# Patient Record
Sex: Male | Born: 1950
Health system: Southern US, Community
[De-identification: ages and names within clinical notes are randomized; demographics above are authoritative.]

## PROBLEM LIST (undated history)

## (undated) DIAGNOSIS — E785 Hyperlipidemia, unspecified: Secondary | ICD-10-CM

## (undated) DIAGNOSIS — M109 Gout, unspecified: Secondary | ICD-10-CM

## (undated) DIAGNOSIS — R12 Heartburn: Secondary | ICD-10-CM

## (undated) DIAGNOSIS — I639 Cerebral infarction, unspecified: Secondary | ICD-10-CM

## (undated) DIAGNOSIS — E119 Type 2 diabetes mellitus without complications: Secondary | ICD-10-CM

## (undated) DIAGNOSIS — I10 Essential (primary) hypertension: Secondary | ICD-10-CM

## (undated) DIAGNOSIS — J45909 Unspecified asthma, uncomplicated: Secondary | ICD-10-CM

## (undated) DIAGNOSIS — E669 Obesity, unspecified: Secondary | ICD-10-CM

## (undated) HISTORY — DX: Obesity, unspecified: E66.9

## (undated) HISTORY — DX: Type 2 diabetes mellitus without complications: E11.9

## (undated) HISTORY — DX: Hyperlipidemia, unspecified: E78.5

---

## 1999-09-16 ENCOUNTER — Ambulatory Visit (HOSPITAL_COMMUNITY): Admission: RE | Admit: 1999-09-16 | Discharge: 1999-09-16 | Payer: Self-pay | Admitting: Family Medicine

## 1999-09-16 ENCOUNTER — Encounter: Payer: Self-pay | Admitting: Family Medicine

## 2003-02-14 HISTORY — PX: KNEE SURGERY: SHX244

## 2003-02-18 ENCOUNTER — Ambulatory Visit (HOSPITAL_BASED_OUTPATIENT_CLINIC_OR_DEPARTMENT_OTHER): Admission: RE | Admit: 2003-02-18 | Discharge: 2003-02-18 | Payer: Self-pay | Admitting: Orthopedic Surgery

## 2003-02-18 ENCOUNTER — Ambulatory Visit (HOSPITAL_COMMUNITY): Admission: RE | Admit: 2003-02-18 | Discharge: 2003-02-18 | Payer: Self-pay | Admitting: Orthopedic Surgery

## 2005-09-13 ENCOUNTER — Emergency Department (HOSPITAL_COMMUNITY): Admission: EM | Admit: 2005-09-13 | Discharge: 2005-09-13 | Payer: Self-pay | Admitting: Emergency Medicine

## 2005-09-18 ENCOUNTER — Inpatient Hospital Stay (HOSPITAL_COMMUNITY): Admission: EM | Admit: 2005-09-18 | Discharge: 2005-09-22 | Payer: Self-pay | Admitting: Emergency Medicine

## 2005-09-18 ENCOUNTER — Ambulatory Visit: Payer: Self-pay | Admitting: *Deleted

## 2005-09-18 DIAGNOSIS — H53461 Homonymous bilateral field defects, right side: Secondary | ICD-10-CM

## 2005-09-18 DIAGNOSIS — Z8673 Personal history of transient ischemic attack (TIA), and cerebral infarction without residual deficits: Secondary | ICD-10-CM

## 2005-09-18 HISTORY — DX: Homonymous bilateral field defects, right side: H53.461

## 2005-09-18 HISTORY — DX: Personal history of transient ischemic attack (TIA), and cerebral infarction without residual deficits: Z86.73

## 2005-09-19 ENCOUNTER — Encounter (INDEPENDENT_AMBULATORY_CARE_PROVIDER_SITE_OTHER): Payer: Self-pay | Admitting: Cardiology

## 2005-09-19 ENCOUNTER — Encounter (INDEPENDENT_AMBULATORY_CARE_PROVIDER_SITE_OTHER): Payer: Self-pay | Admitting: Neurology

## 2005-09-20 ENCOUNTER — Ambulatory Visit: Payer: Self-pay | Admitting: Physical Medicine & Rehabilitation

## 2005-09-26 ENCOUNTER — Encounter: Admission: RE | Admit: 2005-09-26 | Discharge: 2005-12-25 | Payer: Self-pay | Admitting: Nurse Practitioner

## 2005-10-15 ENCOUNTER — Inpatient Hospital Stay (HOSPITAL_COMMUNITY): Admission: EM | Admit: 2005-10-15 | Discharge: 2005-10-17 | Payer: Self-pay | Admitting: Emergency Medicine

## 2005-10-18 ENCOUNTER — Ambulatory Visit (HOSPITAL_COMMUNITY): Admission: RE | Admit: 2005-10-18 | Discharge: 2005-10-18 | Payer: Self-pay | Admitting: Family Medicine

## 2005-10-18 ENCOUNTER — Ambulatory Visit: Payer: Self-pay | Admitting: Family Medicine

## 2005-11-01 ENCOUNTER — Ambulatory Visit: Payer: Self-pay | Admitting: Family Medicine

## 2005-11-01 ENCOUNTER — Ambulatory Visit (HOSPITAL_COMMUNITY): Admission: RE | Admit: 2005-11-01 | Discharge: 2005-11-01 | Payer: Self-pay | Admitting: Sports Medicine

## 2005-12-04 ENCOUNTER — Ambulatory Visit (HOSPITAL_COMMUNITY): Admission: RE | Admit: 2005-12-04 | Discharge: 2005-12-04 | Payer: Self-pay | Admitting: Cardiovascular Disease

## 2005-12-05 ENCOUNTER — Ambulatory Visit: Payer: Self-pay | Admitting: Family Medicine

## 2005-12-11 ENCOUNTER — Encounter (HOSPITAL_COMMUNITY): Admission: RE | Admit: 2005-12-11 | Discharge: 2005-12-12 | Payer: Self-pay | Admitting: Family Medicine

## 2005-12-14 ENCOUNTER — Ambulatory Visit: Payer: Self-pay | Admitting: Sports Medicine

## 2005-12-26 ENCOUNTER — Encounter: Admission: RE | Admit: 2005-12-26 | Discharge: 2006-03-27 | Payer: Self-pay | Admitting: Family Medicine

## 2005-12-28 ENCOUNTER — Ambulatory Visit: Payer: Self-pay | Admitting: Family Medicine

## 2006-01-08 ENCOUNTER — Ambulatory Visit: Payer: Self-pay | Admitting: Gastroenterology

## 2006-03-30 ENCOUNTER — Ambulatory Visit: Payer: Self-pay | Admitting: Family Medicine

## 2006-04-12 DIAGNOSIS — E0500 Thyrotoxicosis with diffuse goiter without thyrotoxic crisis or storm: Secondary | ICD-10-CM | POA: Insufficient documentation

## 2006-04-12 DIAGNOSIS — I6789 Other cerebrovascular disease: Secondary | ICD-10-CM | POA: Insufficient documentation

## 2006-04-12 DIAGNOSIS — F528 Other sexual dysfunction not due to a substance or known physiological condition: Secondary | ICD-10-CM | POA: Insufficient documentation

## 2006-04-12 DIAGNOSIS — I219 Acute myocardial infarction, unspecified: Secondary | ICD-10-CM | POA: Insufficient documentation

## 2006-06-13 ENCOUNTER — Telehealth: Payer: Self-pay | Admitting: *Deleted

## 2006-08-07 ENCOUNTER — Telehealth: Payer: Self-pay | Admitting: Family Medicine

## 2006-10-11 ENCOUNTER — Telehealth (INDEPENDENT_AMBULATORY_CARE_PROVIDER_SITE_OTHER): Payer: Self-pay | Admitting: *Deleted

## 2006-10-12 ENCOUNTER — Ambulatory Visit: Payer: Self-pay | Admitting: Family Medicine

## 2006-10-12 ENCOUNTER — Encounter (INDEPENDENT_AMBULATORY_CARE_PROVIDER_SITE_OTHER): Payer: Self-pay | Admitting: *Deleted

## 2006-10-12 DIAGNOSIS — F17200 Nicotine dependence, unspecified, uncomplicated: Secondary | ICD-10-CM

## 2006-10-12 DIAGNOSIS — I10 Essential (primary) hypertension: Secondary | ICD-10-CM | POA: Insufficient documentation

## 2006-10-12 DIAGNOSIS — E1169 Type 2 diabetes mellitus with other specified complication: Secondary | ICD-10-CM | POA: Insufficient documentation

## 2006-10-12 DIAGNOSIS — E119 Type 2 diabetes mellitus without complications: Secondary | ICD-10-CM | POA: Insufficient documentation

## 2006-10-12 DIAGNOSIS — E785 Hyperlipidemia, unspecified: Secondary | ICD-10-CM

## 2006-10-12 DIAGNOSIS — R5383 Other fatigue: Secondary | ICD-10-CM

## 2006-10-12 DIAGNOSIS — F339 Major depressive disorder, recurrent, unspecified: Secondary | ICD-10-CM | POA: Insufficient documentation

## 2006-10-12 DIAGNOSIS — R5381 Other malaise: Secondary | ICD-10-CM | POA: Insufficient documentation

## 2006-10-16 LAB — CONVERTED CEMR LAB
Alkaline Phosphatase: 96 units/L (ref 39–117)
Creatinine, Ser: 1.5 mg/dL (ref 0.40–1.50)
Glucose, Bld: 94 mg/dL (ref 70–99)
HDL: 65 mg/dL (ref >39)
LDL Cholesterol: 50 mg/dL (ref 0–99)
MCHC: 33.1 g/dL (ref 30.0–36.0)
MCV: 91.6 fL (ref 78.0–100.0)
RBC: 4.65 M/uL (ref 4.22–5.81)
Sodium: 136 meq/L (ref 135–145)
T3, Free: 2.6 pg/mL (ref 2.3–4.2)
Total Bilirubin: 0.5 mg/dL (ref 0.3–1.2)
Total CHOL/HDL Ratio: 2
Total Protein: 7.5 g/dL (ref 6.0–8.3)
Triglycerides: 76 mg/dL (ref <150)
VLDL: 15 mg/dL (ref 0–40)

## 2006-10-30 ENCOUNTER — Telehealth: Payer: Self-pay | Admitting: *Deleted

## 2006-11-02 ENCOUNTER — Encounter: Payer: Self-pay | Admitting: Family Medicine

## 2006-11-14 ENCOUNTER — Encounter (INDEPENDENT_AMBULATORY_CARE_PROVIDER_SITE_OTHER): Payer: Self-pay | Admitting: *Deleted

## 2006-11-14 ENCOUNTER — Ambulatory Visit: Payer: Self-pay | Admitting: Family Medicine

## 2006-12-25 ENCOUNTER — Ambulatory Visit: Payer: Self-pay | Admitting: Family Medicine

## 2006-12-25 LAB — CONVERTED CEMR LAB
Cholesterol, target level: 200 mg/dL
HDL goal, serum: 40 mg/dL
LDL Goal: 70 mg/dL

## 2007-03-10 IMAGING — US US ABDOMEN COMPLETE
1 series · 13 of 25 positions shown · non-contrast
Comparison: None.

CLINICAL DATA: Chest pain.  Evaluate for gallstones.  
ABDOMEN ULTRASOUND:
TECHNIQUE: Complete abdominal ultrasound examination was performed including evaluation of the liver, gallbladder, bile ducts, pancreas, kidneys, spleen, IVC, and abdominal aorta.

[Series 1: unknown · 0.30mm/px · 13 of 61 slices shown]
[im 1/61]
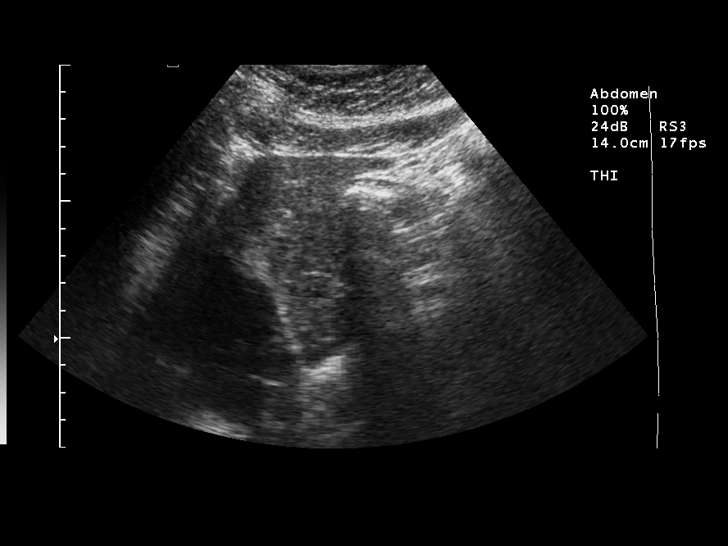
[im 6/61]
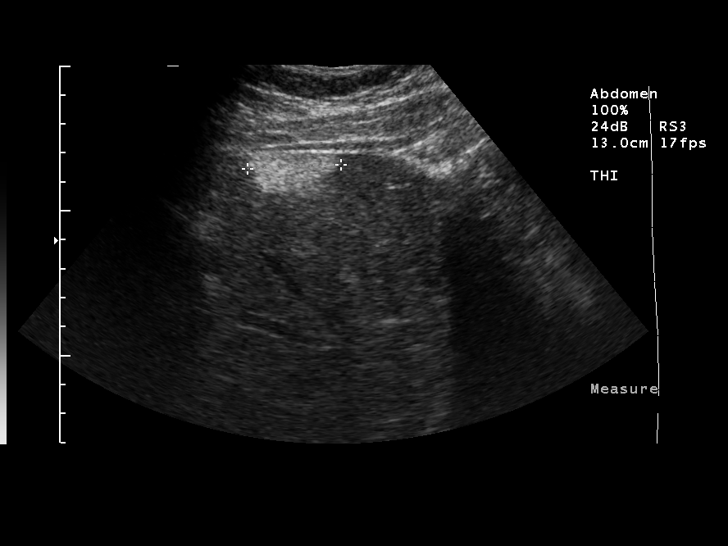
[im 11/61]
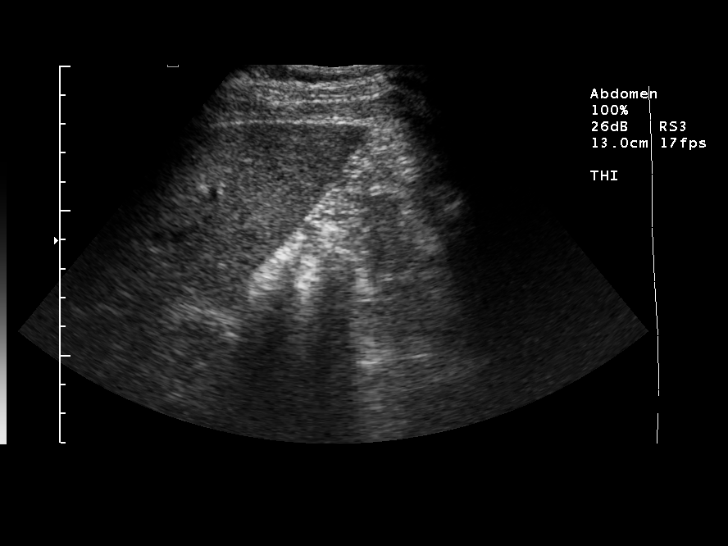
[im 16/61]
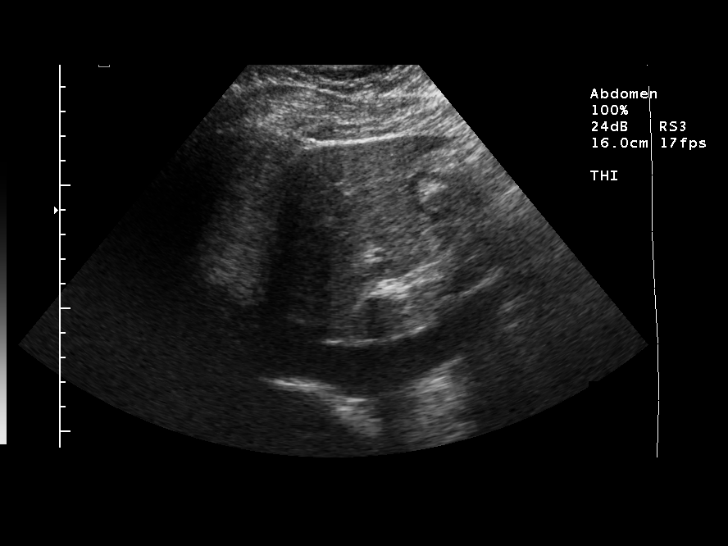
[im 21/61]
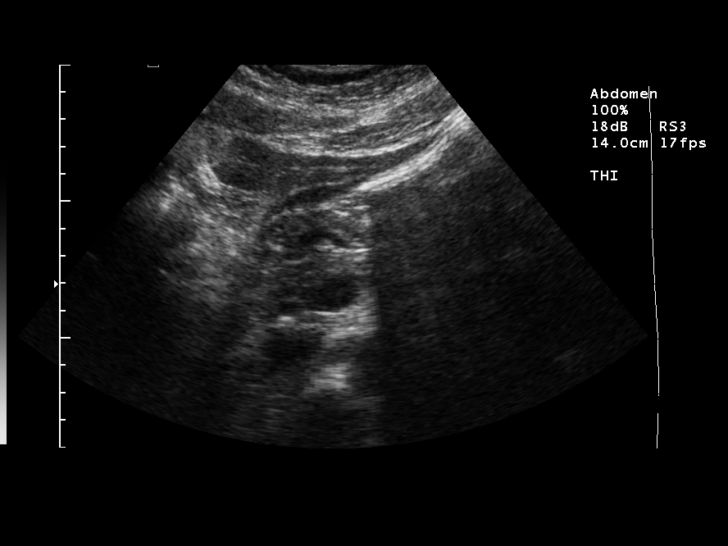
[im 26/61]
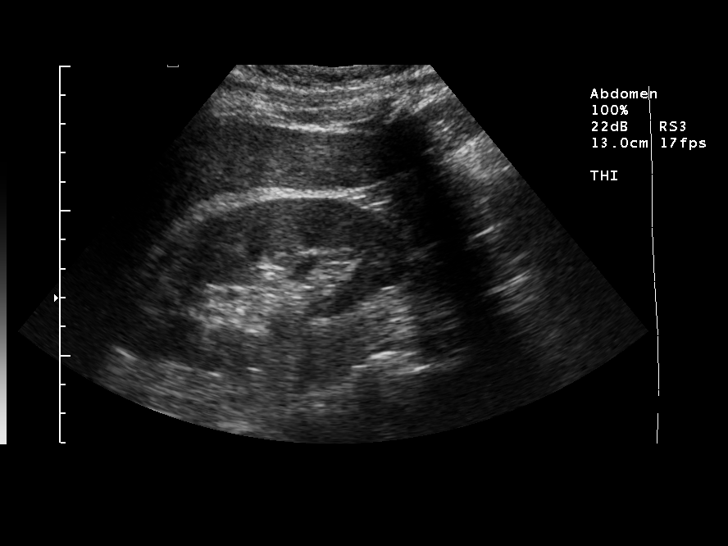
[im 31/61]
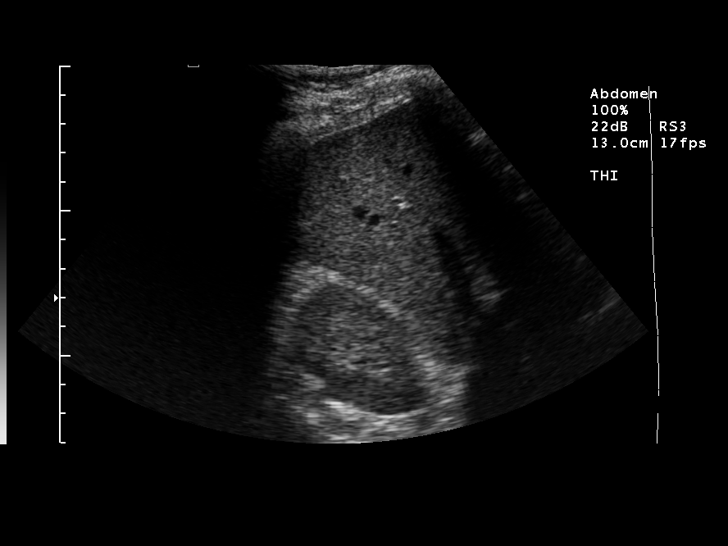
[im 36/61]
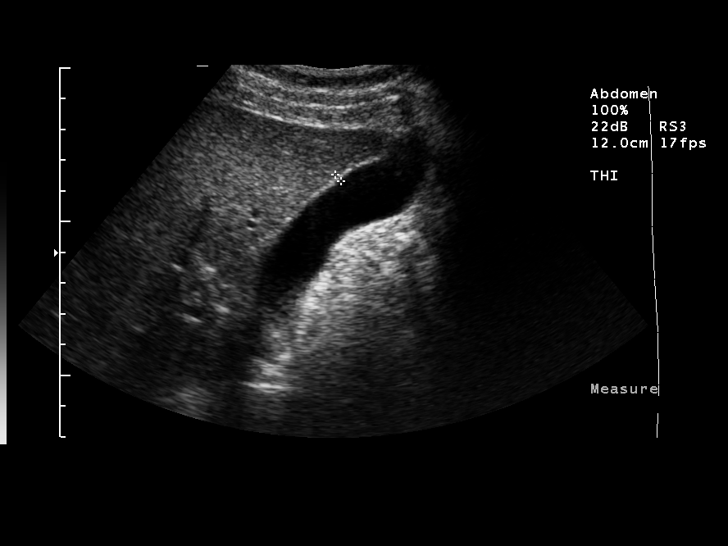
[im 41/61]
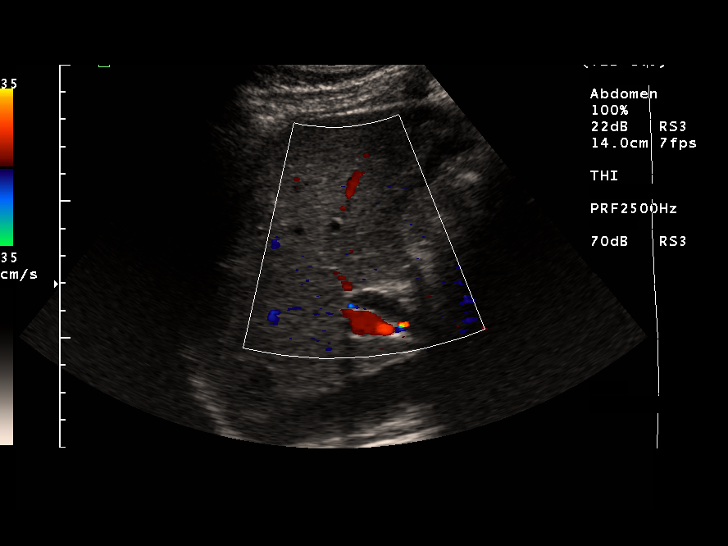
[im 46/61]
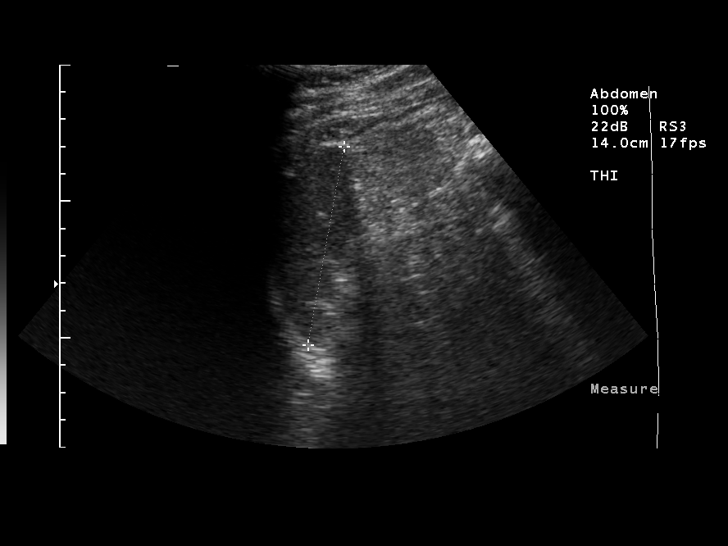
[im 51/61]
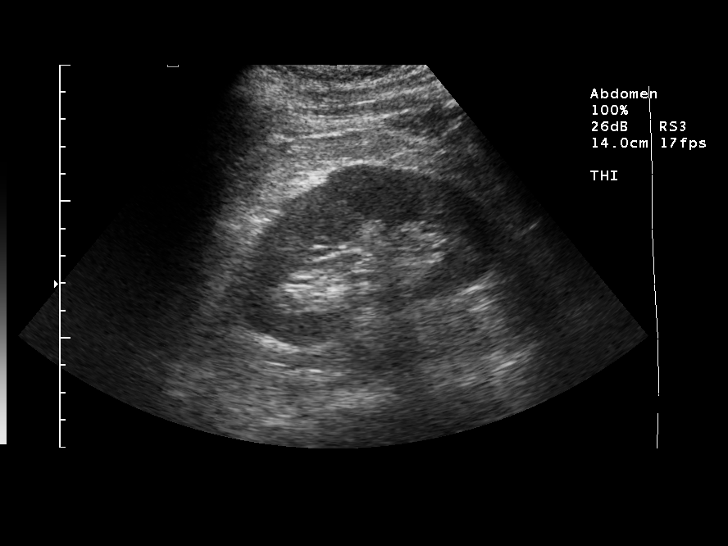
[im 56/61]
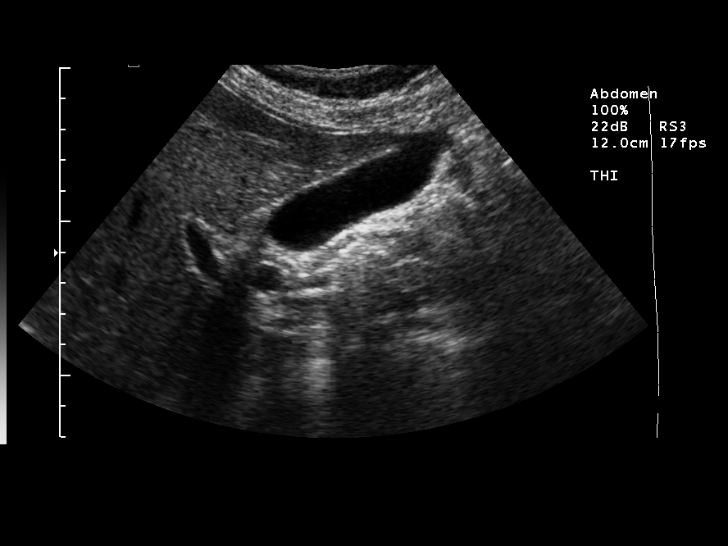
[im 61/61]
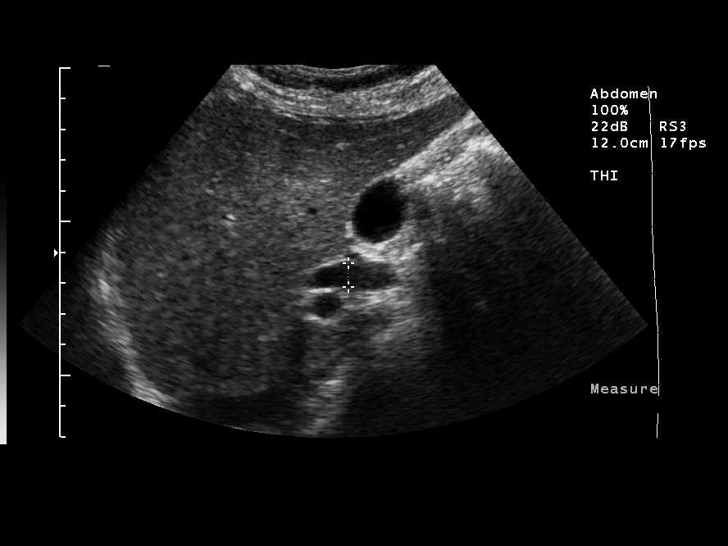

[13 of 25 positions shown; findings below may reference images not displayed]

FINDINGS: There is no gallstone, gallbladder wall thickening, or pericholecystic fluid.  Sonographic Murphy sign was no elicited.  The common duct is borderline/mildly dilated for age measuring up to 7 to 8 mm.  There is no intrahepatic biliary ductal dilatation.  No causative stone is identified.  
In the left lobe of the liver, there is a subcapsular 2.1 X 3.2 X 1.3 cm hyperechoic focus.  This does not deform the adjacent hepatic capsule. 
The IVC is normal in caliber.  The pancreas is poorly visualized. 
The spleen is normal in size and echotexture.  
The right kidney is 11.1 and the left kidney is 11.0 cm.  There is no hydronephrosis.  
The abdominal aorta is not aneurysmal.
IMPRESSION: 1.  No evidence of cholelithiasis or cholecystitis.  
2.  Borderline/mild common duct dilatation for age.  Consider correlation with bilirubin levels.  
3.  3.2 cm hyperechoic subcapsular left liver lobe focus.  Presumably no history of malignancy, this most likely represents a benign entity.  Favored differentiation diagnoses would include focal steatosis and a hemangioma.  Recommend nonemergent hepatic MR to confirm when patient is clinically stable and able to hold his breath.  This study could also more entirely evaluate the distal common duct.  
4.  Limited evaluation of the pancreas.

## 2007-04-10 ENCOUNTER — Ambulatory Visit: Payer: Self-pay | Admitting: Internal Medicine

## 2007-04-12 ENCOUNTER — Ambulatory Visit: Payer: Self-pay | Admitting: *Deleted

## 2007-06-14 ENCOUNTER — Ambulatory Visit: Payer: Self-pay | Admitting: Internal Medicine

## 2007-08-29 ENCOUNTER — Ambulatory Visit: Payer: Self-pay | Admitting: Internal Medicine

## 2007-08-29 ENCOUNTER — Encounter (INDEPENDENT_AMBULATORY_CARE_PROVIDER_SITE_OTHER): Payer: Self-pay | Admitting: Family Medicine

## 2007-08-29 LAB — CONVERTED CEMR LAB
Albumin: 4.5 g/dL (ref 3.5–5.2)
BUN: 26 mg/dL — ABNORMAL HIGH (ref 6–23)
Calcium: 9.2 mg/dL (ref 8.4–10.5)
Chloride: 110 meq/L (ref 96–112)
Free T4: 0.98 ng/dL (ref 0.89–1.80)
Glucose, Bld: 105 mg/dL — ABNORMAL HIGH (ref 70–99)
HDL: 60 mg/dL (ref >39)
LDL Cholesterol: 38 mg/dL (ref 0–99)
Lymphocytes Relative: 35 % (ref 12–46)
Lymphs Abs: 2.3 10*3/uL (ref 0.7–4.0)
Monocytes Relative: 9 % (ref 3–12)
Neutro Abs: 3.5 10*3/uL (ref 1.7–7.7)
Neutrophils Relative %: 54 % (ref 43–77)
Platelets: 299 10*3/uL (ref 150–400)
Potassium: 4.3 meq/L (ref 3.5–5.3)
RBC: 4.32 M/uL (ref 4.22–5.81)
Sodium: 141 meq/L (ref 135–145)
T3 Uptake Ratio: 33.3 % (ref 22.5–37.0)
Total Protein: 7.3 g/dL (ref 6.0–8.3)
Triglycerides: 54 mg/dL (ref <150)
WBC: 6.5 10*3/uL (ref 4.0–10.5)

## 2007-09-02 ENCOUNTER — Encounter: Payer: Self-pay | Admitting: Family Medicine

## 2007-09-02 LAB — CONVERTED CEMR LAB
Hgb A1c MFr Bld: 5.6 % (ref 4.6–6.1)
PSA: 1.33 ng/mL (ref 0.10–4.00)

## 2007-09-23 ENCOUNTER — Ambulatory Visit: Payer: Self-pay | Admitting: Internal Medicine

## 2008-04-13 ENCOUNTER — Encounter: Payer: Self-pay | Admitting: Family Medicine

## 2008-04-13 ENCOUNTER — Ambulatory Visit: Payer: Self-pay | Admitting: Internal Medicine

## 2008-04-13 LAB — CONVERTED CEMR LAB
ALT: 18 units/L (ref 0–53)
AST: 19 units/L (ref 0–37)
Alkaline Phosphatase: 50 units/L (ref 39–117)
Band Neutrophils: 0 % (ref 0–10)
Basophils Absolute: 0 10*3/uL (ref 0.0–0.1)
Basophils Relative: 0 % (ref 0–1)
CO2: 22 meq/L (ref 19–32)
Cholesterol: 140 mg/dL (ref 0–200)
Creatinine, Ser: 1.43 mg/dL (ref 0.40–1.50)
Eosinophils Absolute: 0.1 10*3/uL (ref 0.0–0.7)
Hemoglobin: 14.6 g/dL (ref 13.0–17.0)
LDL Cholesterol: 47 mg/dL (ref 0–99)
MCHC: 33.6 g/dL (ref 30.0–36.0)
Monocytes Relative: 9 % (ref 3–12)
Neutro Abs: 5.1 10*3/uL (ref 1.7–7.7)
Neutrophils Relative %: 63 % (ref 43–77)
RDW: 15.5 % (ref 11.5–15.5)
Sodium: 140 meq/L (ref 135–145)
TSH: 3.435 microintl units/mL (ref 0.350–4.50)
Total Bilirubin: 0.5 mg/dL (ref 0.3–1.2)
Total CHOL/HDL Ratio: 1.7
Total Protein: 7.8 g/dL (ref 6.0–8.3)
VLDL: 11 mg/dL (ref 0–40)

## 2008-05-27 ENCOUNTER — Encounter: Payer: Self-pay | Admitting: Family Medicine

## 2008-05-27 ENCOUNTER — Ambulatory Visit: Payer: Self-pay | Admitting: Internal Medicine

## 2008-05-27 LAB — CONVERTED CEMR LAB
Sed Rate: 4 mm/hr (ref 0–16)
Uric Acid, Serum: 9.2 mg/dL — ABNORMAL HIGH (ref 4.0–7.8)

## 2008-06-11 ENCOUNTER — Ambulatory Visit: Payer: Self-pay | Admitting: Internal Medicine

## 2008-09-11 ENCOUNTER — Ambulatory Visit: Payer: Self-pay | Admitting: Internal Medicine

## 2008-12-02 ENCOUNTER — Ambulatory Visit: Payer: Self-pay | Admitting: Internal Medicine

## 2008-12-22 ENCOUNTER — Encounter: Admission: RE | Admit: 2008-12-22 | Discharge: 2009-01-22 | Payer: Self-pay | Admitting: Family Medicine

## 2010-07-01 NOTE — Discharge Summary (Signed)
[name], [name]:  [id number]   MEDICAL RECORD NO.:  [id number]          PATIENT TYPE:  INP   LOCATION:  3032                         FACILITY:  [facility]   PHYSICIAN:  Genene Churn. Love, M.D.    DATE OF BIRTH:  [birth year]   DATE OF ADMISSION:  09/18/2005  DATE OF DISCHARGE:  09/22/2005                                 DISCHARGE SUMMARY   DISCHARGE DIAGNOSES:  1. Acute left posterior infarction secondary to left PCA cutoff, thought      to be embolic though no source found.  2. Right internal carotid artery stenosis 60-to-80%.  3. Hypertension.  4. Asthma.   DISCHARGE MEDICATIONS:  1. Aspirin 81 mg a day through October 03, 2005, then discontinue.  2. Aggrenox 1 a day at bedtime through October 03, 2005, then increase to 2      times a day.  3. Tylenol 2 tablets 30 minutes before Aggrenox dose x1 week then stop.  4. Pepcid 20 mg a day.  5. Diovan 80 mg a day.   STUDIES PERFORMED:  1. Carotid Doppler which shows left distal internal carotid artery plaque      with 60-to-79% stenosis, no right ICA stenosis, vertebral artery flow      antegrade bilaterally.  2. 2-D echocardiogram shows ejection fraction of 55-to-65% with no left      ventricular regional wall motion abnormalities, possibility of  a      foramen ovale could not be excluded, there was no embolic source.  3. EKG shows normal sinus rhythm with nonspecific ST and T wave      abnormalities.  4. CT of the head shows subtle hypoattenuation and loss of gray white      matter in left occipital lobe concerning for acute infarction, thrombus      or ischemic disease.  Chest x-rays show normal chest.  5. MRI of the brain is limited secondary to significant artifact arising      from metallic structure in tongue, moderate-to-large size acute non-      hemorrhagic infarct involving medial and posterior left temporal lobe,      left occipital lobe and left thalamus, moderate nonspecific white  matter-type changes.  6. MRA of the head shows blunt cutoff of the left posterior cerebral      artery at P2 origin, moderate branch vessel atherosclerotic type      changes, slightly limited exam due to artifact metallic structure in      patient's tongue.  7. Chest x-ray:  No active disease.   LABORATORY STUDIES:  CBC on admission:  Normal differential and normal sed  rate.  Five coagulation studies on admission normal, factor V Leiden  negative, lupus anticoagulant not detected.  Protein C 71, protein S 89,  protein S fractional 60, protein C fractional 115.  Chemistries with glucose  101 to 124, otherwise normal.  Liver function tests normal.  Hemoglobin A1c  5.4, homocystine 10.9.  Cardiac enzymes with troponin 0.10 and 0.04,  otherwise normal.  Cholesterol 125, triglycerides 66, HDL 38 and LDL 74.  Complement C3 is  117, complement C4 28, complement CH50 117.  Urine drug  screen was positive for cannabinoid, urinalysis normal and a cardiolipin  antibody at 7.  ANA negative, beta-2 glycoprotein negative, prothrombin gene  mutation negative.   HISTORY OF PRESENT ILLNESS:  [name] is a 60 year old African-  American male who has no significant past medical history.  He presents to  the ED after awakening the morning of admission with left-sided headache,  right-sided weakness and numbness in the upper and lower extremities,  slurred speech and right visual loss.  Patient states his symptoms started  when he woke up this morning between 8 and 9 a.m.  His symptoms have been  stable.  CT in the emergency room showed a left occipital infarct.  The  patient has had no prior symptoms that are similar.  He denies vertigo or  any problems with falls or loss of consciousness.  Of note, the patient was  in the emergency department last week for chest pain and left AMA.  He will  be admitted to the hospital for further evaluation.   HOSPITAL COURSE:  The patient was found to have an  acute left inferior  infarct which appeared embolic, though no etiology was found including a  transesophageal echocardiogram which was unremarkable.  He was placed on  Aggrenox for secondary stoke prevention.  Hypercoagulable panel was  negative, but patient was found to have vascular risk factors of  hypertension and right ICA stenosis.  He was evaluated by PT/OT and  initially felt to need inpatient rehab, but continued to progress throughout  hospitalization until the point he was safe for discharge home with  outpatient therapies.  His most significant deficit seems to be that of a  right homonymous hemianopsia, for which he was referred to a Dr. Aura Camps at time of discharge.   CONDITION ON DISCHARGE:  Patient alert and oriented x3, appeared to have  right homonymous hemianopsia.  He has no aphagia, no dysarthria.  His eye  movements are full.  He has some mild right-hand weakness with decreased  finger-to-nose asymmetry on the right, he has decreased sensation on the  right, strength in the left side is normal, sensation in the left side is  normal.  Chest is clear to auscultation.  His heart rate is regular.   DISCHARGE PLAN:  1. Discharge home with family.  2. Outpatient PT/OT and speech therapy.  3. Aggrenox secondary for stroke prevention.  4. Follow up with Dr. Aura Camps for evaluation of visual fields on      Friday, August 17th, at 11 a.m.  5. Follow up with Dr. Pearlean Brownie in two to three months.  6. Follow up with primary care physician, Dr. Warrick Parisian, within the      next one month.      Annie Main, N.P.    ______________________________  Genene Churn. Sandria Manly, M.D.    SB/MEDQ  D:  10/03/2005  T:  10/03/2005  Job:  [id number]   cc:   Pramod P. Pearlean Brownie, MD  [facility]  Casimiro Needle A. Karleen Hampshire, M.D.

## 2010-07-01 NOTE — Assessment & Plan Note (Signed)
[facility]                           GASTROENTEROLOGY OFFICE NOTE   NAME:[name], [name]                     MRN:          [id number]  DATE:01/08/2006                            DOB:          1950-03-13    REFERRED BY:  Ruthe Mannan, M.D. from [facility].   NEUROLOGIST:  His neurologist is Dr. Pearlean Brownie from [facility].   REASON FOR REFERRAL:  Dr. Dayton Martes asked me to evaluate [name] regarding  screening colonoscopy.   HISTORY OF PRESENT ILLNESS:  [name] is a very pleasant 60 year old man  who takes Plavix for recent stroke in August of 2007. He has had no GI  bleeding; no trouble with his bowels including constipation nor diarrhea.   REVIEW OF SYSTEMS:  Is essentially normal and is available on his nursing  intake sheet.   PAST MEDICAL HISTORY:  1. Stroke August 2007.  2. Acute myocardial infarction in or around September 2007, medically      managed.  3. Hypertension.  4. Cardiac arrhythmias.  5. Asthma.  6. Thyroid trouble.  7. Right knee surgery.   CURRENT MEDICATIONS:  Protonix, Plavix, Caduet, Wellbutrin, Diovan,  propranolol, methimazole.   SOCIAL HISTORY:  Divorced, 3 children. Smokes 2-3 cigarettes a day. Drinks  alcohol minimally. Uses marijuana.   FAMILY HISTORY:  No colon cancer or colon polyps in family.   PHYSICAL EXAMINATION:  Is 6 feet, 2 inches, 194 pounds, blood pressure is  118/72, pulse is 88.  CONSTITUTIONAL: Generally well-appearing.  NEUROLOGIC: Alert and oriented x3.  EYES: Extraocular movements intact.  MOUTH: Oropharynx moist. No lesions.  NECK: Supple. No lymphadenopathy.  CARDIOVASCULAR: HEART: Regular rate and rhythm.  LUNGS:  Clear to auscultation bilaterally.  ABDOMEN: Soft and nontender, nondistended. Normal bowel sounds.  EXTREMITIES: No lower extremity edema.  SKIN: No rashes or lesions on visible extremities.   ASSESSMENT/PLAN:  A 60 year old man at  routine risk for colorectal cancer,  at increased risk for colonoscopy complications given his current Plavix  use.   [name] had a stroke in August and has been on Plavix since then. Their  understanding was that he is at greater risk for repeat stroke in about a 6  month window time. It is not clear to them whether he will be able to come  off of the Plavix after 6 months or not. I discussed proceeding with the  colonoscopy now, while he is on Plavix, but there would be an increased risk  of bleeding and so I prefer to do this since this is for routine screening  purposes when he is off of Plavix. I will forward this note to Dr. Pearlean Brownie,  his neurologist, with the specific question of when he thinks it is  relatively more safe to hold his Plavix. If this 6 month window is his  general recommendation, I think it is completely safe to wait to do his  colonoscopy after that 6 month window is up. I would have him hold the  Plavix for 5-7 days prior to it. I see no reason for any further blood  tests  or imaging studies prior to then.     Rachael Fee, MD  Electronically Signed    DPJ/MedQ  DD: 01/08/2006  DT: 01/08/2006  Job #: [id number]   cc:   Ruthe Mannan, M.D.  Pramod P. Pearlean Brownie, MD

## 2010-07-01 NOTE — H&P (Signed)
NAMETAMMY, [name]   MEDICAL RECORD NO.:  [id number]           PATIENT TYPE:   LOCATION:                               FACILITY:  [facility]   PHYSICIAN:  Ruthe Mannan, M.D.            DATE OF BIRTH:   DATE OF ADMISSION:  10/15/2005  DATE OF DISCHARGE:                                HISTORY & PHYSICAL   CHIEF COMPLAINT:  Chest and abdominal pain.   HISTORY OF PRESENT ILLNESS:  [name] is a 60 year old male with a  history of a left occipital stroke in August of 2007.  He presents to the ED  today with chest pain x2-3 weeks, which has been worsening to 10/10 over the  last few days.  Pain typically occurs after eating and can last from 10-15  minutes up to one hour and does not radiate to arms or jaw.  It does radiate  to right lower back and occasionally with the pain he also complains of  right upper abdominal pain.  He denies any diaphoresis with pain.  He does  admit to shortness of breath x2-3 weeks which has worsened __________ .  He  denies wheezing, cough, or any sputum production.  He is also complaining of  increased urinary urgency which has been occurring on and off for a few  months.  He denies dysuria, hematuria.  He does think he has had fevers over  the last couple of days.  He did not measure any temperature at home.   PAST MEDICAL HISTORY:  Stroke in August 2007.   PAST SURGICAL HISTORY:  Knee surgery.   FAMILY HISTORY:  Father had a heart attack at age 55.  Mother had a stroke.   SOCIAL HISTORY:  He smokes 3-4 cigarettes per day, occasional ETOH and  marijuana use.  The patient lives alone, has a girlfriend.   MEDICATIONS:  1. Aggrenox b.i.d.  2. Zantac.  3. Diovan 80 mg daily.   REVIEW OF SYSTEMS:  GENERAL:  Positive fevers, night sweats.  RESPIRATORY:  Positive chest pain, positive shortness of breath.  No cough.  ABDOMINAL:  Positive abdominal pain, positive vomiting.  No diarrhea.  GENITOURINARY:  Urinary  frequency, positive urinary urgency.  No decreased stream.  NEUROLOGIC:  Positive right-sided weakness and blind spots on right side  from stroke.  PSYCHIATRIC:  Positive depression, positive trouble sleeping.   PHYSICAL EXAMINATION:  VITAL SIGNS:  Temperature 97.1, blood pressure  139/79, pulse 107, respirations 20, O2 saturation 99% on room air.  GENERAL:  He is alert in no acute distress.  HEENT:  Pupils equal and reactive to light.  RESPIRATORY:  Clear to auscultation bilaterally.  Pain reproducible with  inspiration or on palpation.  CARDIOVASCULAR:  Target cardiac regular rhythm.  Pain not producible with  palpation.  ABDOMEN:  Soft.  Mildly tender in right upper quadrant.  Positive bowel  sounds.  Some voluntary guarding.  EXTREMITIES:  No edema.  Good pulses.  RECTAL:  Prostate mildly enlarged.  Hemoccult negative.  NEUROLOGIC:  5/5 strength, right slightly  greater than left.   LABORATORY DATA:  Chest x-ray and KUB were negative.  Urinalysis negative.  Cardiac enzymes negative x2.   White count 5.9, H&H 13.4 and 34.4, platelets 282.  Chem-7:  Sodium 136,  potassium 4.1, chloride 105, CO2 23, BUN and creatinine are 21/1.4., glucose  123.   ASSESSMENT/PLAN:  This is a 60 year old male with past medical history of  stroke, __________  chest pain, and abdominal pain.  1. Chest pain.  Cardiac enzymes taken to rule out MI.  EKG showed      nonspecific ST changes in V2, V3, and V4.  The patient states the pain      is currently resolved.  He was not given anything in the ED other than      aspirin.  Likely GI.  Will start Protonix.  Also check drug screen and      may need to check whether profile was checked on last admission.  Also      get an EKG in the a.m.  2. Abdominal pain.  KUB is negative.  Lipase negative.  Hemoccult      negative.  Nausea currently resolved.  Check CMET and CBC in the      morning.  Consider abdominal ultrasound if spike fever overnight to      rule  out gallstones.  3. Depression.  Zoloft given daily, more for DVT prophylaxis.  The patient      is ambulatory and not __________ .           ______________________________  Ruthe Mannan, M.D.     TA/MEDQ  D:  10/15/2005  T:  10/15/2005  Job:  [id number]

## 2010-07-01 NOTE — Discharge Summary (Signed)
[name], [name]              ACCOUNT NO.:  [id number]   MEDICAL RECORD NO.:  [id number]          PATIENT TYPE:  INP   LOCATION:  3703                         FACILITY:  [facility]   PHYSICIAN:  Ruthe Mannan, M.D.       DATE OF BIRTH:  March 20, 1950   DATE OF ADMISSION:  10/15/2005  DATE OF DISCHARGE:  10/17/2005                                 DISCHARGE SUMMARY   PRIMARY CARE PHYSICIAN:  Ruthe Mannan, M.D.   This is a pleasant 60 year old African-American male who had a recent  history of a left-sided CVA in August of 2007, who presented to the ED on  October 15, 2005, with complaints of chest and abdominal pain for 2-3  weeks, which had been worsening over the past 2 days.  The patient notes  that the chest pain is mid epigastric and also goes into the right upper  quadrant and a burning that is worse after meals.  It usually lasts anywhere  from 15 minutes to 1 hour.  He had noticed occasional nausea and had vomited  twice prior to showing up to the emergency department.  The vomit was  nonbilious and nonbloody.  The bowel movements have been regular.  The  patient had been taking Pepcid for GERD.  He denied any shortness of breath  or diaphoresis and he noticed that the pain had been slightly worse with  exertion.  The patient had been hypercoagulative in the past, but a workup  was negative.  At the time of his stroke, his LDL was 74.  The patient is a  smoker and his dad had an MI at the age of 78.  He had some residual right-  sided weakness from his CVA and continues to work with PT as an outpatient.  The patient was then admitted to the ED.  Cardiac enzymes were taken.  They  were negative x2.  His CBC and BMET were within normal limits.  We also did  a chest x-ray and a KUB, which were also within normal limits.   Dictation ended at this point.           ______________________________  Ruthe Mannan, M.D.     TA/MEDQ  D:  10/17/2005  T:  10/17/2005  Job:  [id number]

## 2010-07-01 NOTE — H&P (Signed)
NAMEJIMY, GATES NO.:  [id number]   MEDICAL RECORD NO.:  [id number]          PATIENT TYPE:  INP   LOCATION:  1829                         FACILITY:  [facility]   PHYSICIAN:  Bevelyn Buckles. Champey, M.D.DATE OF BIRTH:  1950/12/27   DATE OF ADMISSION:  09/18/2005  DATE OF DISCHARGE:                                HISTORY & PHYSICAL   REQUESTING PHYSICIAN:  Paula Libra, MD   REASON FOR ADMISSION:  Stroke.   HISTORY OF PRESENT ILLNESS:  Mr. Blissett is a 60 year old African-American  male with no significant past medical history.  He presented to the ED after  awakening this morning with left-sided headache, right-sided  weakness/numbness, greater in the upper and lower extremities, slurred  speech, which has improved, and right visual loss.  Patient states that his  symptoms started when he woke up this morning at around 8-9 a.m.  His  symptoms have been stable.  CT in the emergency room showed a left occipital  infarct.  The patient has had no prior episodes.  He denies any vertigo,  swallowing problems, falls, or loss of consciousness.  The patient was in  the ED last week for chest pain and left AMA.   PAST MEDICAL HISTORY:  Positive for asthma.   CURRENT MEDICATIONS:  None.   ALLERGIES:  No known drug allergies.   FAMILY HISTORY:  Positive for stroke in his mother.   SOCIAL HISTORY:  Patient lives alone.  Smokes two cigars per day.  Drinks  one beer every two days.  Patient occasionally will smoke marijuana.   REVIEW OF SYSTEMS:  Positive, as per HPI, and also for depression.  Review  of systems, negative as per HPI, in greater than eight other organ systems.   PHYSICAL EXAMINATION:  VITAL SIGNS:  Temperature 97.8, blood pressure  184/99, pulse 102, respirations 18, O2 sat is 98%.  HEENT:  Normocephalic and atraumatic.  Extraocular muscles are intact.  Patient has a right visual field cut.  Pupils are equal, round and reactive  to light.  NECK:  Supple  with no carotid bruits.  LUNGS:  Clear.  HEART:  Regular.  ABDOMEN:  Soft and nontender.  EXTREMITIES:  Good pulses.  NEUROLOGIC:  Patient is awake, alert and oriented x3.  Language is fluent.  Patient responding to meds appropriately.  Cranial nerves:  Patient has a  left visual field cut, decreased sensation in the right face, otherwise  intact.  Motor examination:  Patient has +4/5 strength on the right and 5/5  strength on the left.  Patient has normal tone in all four extremities and  has a right parietal drift.  Sensory examination:  The patient has decreased  sensation on the right when compared to the left to light touch.  Reflexes  are 1-2+ throughout.  Toes are neutral bilaterally.  Cerebellar function is  within normal limits.  Finger to nose slightly slower on the right.  Gait is  not assessed secondary to safety.   LABS:  Sodium is 141, potassium 4.3, chloride 110, CO2 25, BUN 14,  creatinine 1.2, glucose 124.  WBC  6.7, hemoglobin 14.5, hematocrit 43,  platelets 266.  PT is 13.4, INR 1, PTT 26.   CT of the head showed hypoattenuation in the left occipital area, consistent  with an acute infarct.   IMPRESSION:  Mr. Macken is a 60 year old African-American male with new  left occipital stroke.  The patient is not a candidate for IV t-PA, as he is  greater than three hours out from onset of symptoms.  This patient woke up  this morning with the symptoms.  Admit to the stroke MD service.  Start an  aspirin per day.  Check MRI/MRA of the brain, carotid Dopplers, and  echocardiogram.  We will check his lipids and homocysteine level.  We will  get PT/OT and speech consults.  We will keep the patient n.p.o. and place  him on IV fluids, will test a swallowing evaluation.  We will also place the  patient on DVT prophylaxis.      Bevelyn Buckles. Nash Shearer, M.D.  Electronically Signed     DRC/MEDQ  D:  09/18/2005  T:  09/18/2005  Job:  [id number]

## 2010-07-01 NOTE — Op Note (Signed)
NAMETILMAN, MCCLAREN                        ACCOUNT NO.:  [id number]   MEDICAL RECORD NO.:  [id number]                   PATIENT TYPE:  AMB   LOCATION:  DSC                                  FACILITY:  [facility]   PHYSICIAN:  Feliberto Gottron. Turner Daniels, M.D.                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   PROCEDURE:  Right knee arthroscopic debridement of chondromalacia of the  patella focal grade 3 medial facet and posterior horn medial meniscus tear  focal debridement.   SURGEON:  Feliberto Gottron. Turner Daniels, M.D.   FIRST ASSISTANT:  Skip Mayer, P.A.-C.   ANESTHESIA:  Local with IV sedation.   ESTIMATED BLOOD LOSS:  Minimal.   FLUIDS REPLACED:  500 mL crystalloid.   DRAINS:  None.   TOURNIQUET TIME:  None.   INDICATIONS FOR PROCEDURE:  60 year old man with intermittent locking and  catching of his right knee.  MRI proven medial meniscal tear.  He desires  elective arthroscopic evaluation and treatment of his right knee having  failed conservative treatment with anti-inflammatory medicines and therapy.   DESCRIPTION OF PROCEDURE:  The patient was identified by arm band and taken  to the operating room at [facility].  Appropriate anesthetic  monitors were attached and local anesthesia with IV sedation induced into  the right knee.  Lateral post was applied to the table.  The right lower  extremity was prepped and draped in the usual sterile fashion from the ankle  to the mid thigh.  Using a #11 blade, standard inferomedial and  inferolateral peripatellar portals were then made allowing introduction of  the arthroscope through the inferolateral portal and the outflow through the  inferomedial  portal.  Diagnostic arthroscopy revealed focal grade 3  chondromalacia of the medial facet of the pillar which was debrided back to  a stable margin with a 3.5 Gator sucker shaver.  Moving into the medial  compartment, the medial meniscus had a posterior horn tear which was trimmed  with a 3.5 Gator sucker shaver as well as the straight small biters.  The  ACL and the PCL were intact.  The lateral compartment was pristine.  The  gutters were clear.  The scope was taken to the lateral side of the PCL  clearing the  posterior compartment.  The knee was irrigated with normal saline solution.  Arthroscopic instruments were removed.  A dressing of Xeroform, 4 by 4  dressing sponges, Webril, and an Ace wrap were applied.  The patient was  then awakened and taken to the recovery room without difficulty.  Feliberto Gottron. Turner Daniels, M.D.    Ovid Curd  D:  02/18/2003  T:  02/18/2003  Job:  [id number]

## 2010-07-01 NOTE — Discharge Summary (Signed)
[name], [name]              ACCOUNT NO.:  [id number]   MEDICAL RECORD NO.:  [id number]          PATIENT TYPE:  INP   LOCATION:  3703                         FACILITY:  [facility]   PHYSICIAN:  Ruthe Mannan, M.D.       DATE OF BIRTH:  [birth year]   DATE OF ADMISSION:  10/15/2005  DATE OF DISCHARGE:  10/17/2005                                 DISCHARGE SUMMARY   ADMISSION DIAGNOSES:  1. Chest pain.  2. Right upper quadrant abdominal pain.  3. History of cerebrovascular accident.  4. Gastroesophageal reflux disease.  5. Depression.  6. Hypertension.   DISCHARGE MEDICATIONS:  1. Plavix 75 mg q.day.  2. Protonix 40 mg daily.  3. Zoloft 50 mg q.day.   DISPOSITION:  This patient is being discharged.  He is stable.  He will  follow up with his primary care physician in the outpatient setting to  further workup his abdominal pain symptoms, perhaps consider an endoscopy in  the outpatient setting.   HOSPITAL COURSE:  1. This is a pleasant 60 year old African American male with a recent      history of a left-sided occipital CVA in August of 2007, who came to      the emergency room on September 2, complaining of substernal chest pain      and abdominal pain for 2 to 3 weeks, which was worsening over the last      couple of days.  He noticed some mid epigastric and right upper      quadrant pain and burning that was worsening postprandially and      occasional vomiting.  He had vomited twice.  He denied any diaphoresis      or shortness of breath.  He says, it is slightly worse with exertion.      He had a hypercoagulable workup during the time of his stroke which was      negative, which was stable.  His LDL was 74.  We admitted him to the      [facility] to a telemetry bed.  Because of chest      pain, we wanted to rule out an MI.  Patient had cardiac enzymes x3 that      were all negative, 8 hours apart.  His EKG was also normal.  We also      got a chest x-ray  and a KUB, which were also negative.  Chest pain had      essentially resolved by day 2 of admission.  On the night of September      3, he had called the nurse once for some chest pain, which was resolved      with Tylenol and some Maalox as well.  He has been started on Protonix,      which also seemed to help a little bit for his chest pain.  So, we are      thinking it was likely gastritis caused by his use of Aggrenox.  2. For his right upper quadrant abdominal pain, we ordered an abdominal  ultrasound, which was normal just to rule out gallstones.  Likely      thinking it is also gastritis related to the Aggrenox.  Will followup      in the outpatient setting as well.  He had vomited once during his      hospital course postprandially.  It was nonbilious, nonbloody.  That      was the only time that he vomited during his hospital course.  3. He has a history of CVA.  We kept him on Aggrenox b.i.d. while he was      here.  We will send him home on Plavix 75 mg daily, instead thinking      that the Aggrenox led to his gastritis.  His blood pressure and      cholesterol has been stabbing.  We will discuss smoking cessation in      the outpatient setting.  4. His anxiety and depression.  Patient expressed some depression and      difficulty sleeping since his stroke.  We started him on Zoloft 50 mg      daily.  The patient has agreed to try this out.  Discussed that it may      take a few weeks for him to see any results and will follow up with him      on an outpatient setting.  The patient denies any suicidal ideation or      homicidal ideation at this time.  5. Dispo.  This patient is stable enough to go home.  We will followup      gastritis in the outpatient setting.  Patient is instructed to continue      taking Protonix and Plavix at home and Zoloft and will followup.  He      has a followup appointment scheduled with me, Ruthe Mannan, on September      19 at 3:30 p.m.            ______________________________  Ruthe Mannan, M.D.     TA/MEDQ  D:  10/17/2005  T:  10/17/2005  Job:  [id number]

## 2013-02-10 ENCOUNTER — Encounter (HOSPITAL_COMMUNITY): Payer: Self-pay | Admitting: Emergency Medicine

## 2013-02-10 ENCOUNTER — Inpatient Hospital Stay (HOSPITAL_COMMUNITY)
Admission: EM | Admit: 2013-02-10 | Discharge: 2013-02-14 | DRG: 638 | Disposition: A | Discharge status: Home Health | Payer: Medicare HMO | Attending: Cardiovascular Disease | Admitting: Cardiovascular Disease

## 2013-02-10 ENCOUNTER — Emergency Department (HOSPITAL_COMMUNITY): Payer: Medicare HMO

## 2013-02-10 DIAGNOSIS — E871 Hypo-osmolality and hyponatremia: Secondary | ICD-10-CM | POA: Diagnosis present

## 2013-02-10 DIAGNOSIS — Z8673 Personal history of transient ischemic attack (TIA), and cerebral infarction without residual deficits: Secondary | ICD-10-CM

## 2013-02-10 DIAGNOSIS — N182 Chronic kidney disease, stage 2 (mild): Secondary | ICD-10-CM | POA: Diagnosis present

## 2013-02-10 DIAGNOSIS — R269 Unspecified abnormalities of gait and mobility: Secondary | ICD-10-CM | POA: Diagnosis present

## 2013-02-10 DIAGNOSIS — I10 Essential (primary) hypertension: Secondary | ICD-10-CM

## 2013-02-10 DIAGNOSIS — I69998 Other sequelae following unspecified cerebrovascular disease: Secondary | ICD-10-CM

## 2013-02-10 DIAGNOSIS — E785 Hyperlipidemia, unspecified: Secondary | ICD-10-CM

## 2013-02-10 DIAGNOSIS — N179 Acute kidney failure, unspecified: Secondary | ICD-10-CM | POA: Diagnosis present

## 2013-02-10 DIAGNOSIS — K7689 Other specified diseases of liver: Secondary | ICD-10-CM | POA: Diagnosis present

## 2013-02-10 DIAGNOSIS — R5381 Other malaise: Secondary | ICD-10-CM

## 2013-02-10 DIAGNOSIS — E1110 Type 2 diabetes mellitus with ketoacidosis without coma: Secondary | ICD-10-CM

## 2013-02-10 DIAGNOSIS — Z79899 Other long term (current) drug therapy: Secondary | ICD-10-CM

## 2013-02-10 DIAGNOSIS — E1310 Other specified diabetes mellitus with ketoacidosis without coma: Principal | ICD-10-CM | POA: Diagnosis present

## 2013-02-10 DIAGNOSIS — Z6832 Body mass index (BMI) 32.0-32.9, adult: Secondary | ICD-10-CM

## 2013-02-10 DIAGNOSIS — I129 Hypertensive chronic kidney disease with stage 1 through stage 4 chronic kidney disease, or unspecified chronic kidney disease: Secondary | ICD-10-CM | POA: Diagnosis present

## 2013-02-10 DIAGNOSIS — E669 Obesity, unspecified: Secondary | ICD-10-CM | POA: Diagnosis present

## 2013-02-10 DIAGNOSIS — F17200 Nicotine dependence, unspecified, uncomplicated: Secondary | ICD-10-CM | POA: Diagnosis present

## 2013-02-10 DIAGNOSIS — R209 Unspecified disturbances of skin sensation: Secondary | ICD-10-CM

## 2013-02-10 DIAGNOSIS — I6789 Other cerebrovascular disease: Secondary | ICD-10-CM

## 2013-02-10 HISTORY — DX: Cerebral infarction, unspecified: I63.9

## 2013-02-10 HISTORY — DX: Essential (primary) hypertension: I10

## 2013-02-10 HISTORY — DX: Heartburn: R12

## 2013-02-10 HISTORY — DX: Unspecified asthma, uncomplicated: J45.909

## 2013-02-10 HISTORY — DX: Type 2 diabetes mellitus with ketoacidosis without coma: E11.10

## 2013-02-10 LAB — POCT I-STAT TROPONIN I: Troponin i, poc: 0.06 ng/mL (ref 0.00–0.08)

## 2013-02-10 LAB — BASIC METABOLIC PANEL
BUN: 39 mg/dL — ABNORMAL HIGH (ref 6–23)
BUN: 37 mg/dL — ABNORMAL HIGH (ref 6–23)
CO2: 14 mEq/L — ABNORMAL LOW (ref 19–32)
CO2: 12 mEq/L — ABNORMAL LOW (ref 19–32)
Calcium: 9.4 mg/dL (ref 8.4–10.5)
Chloride: 98 mEq/L (ref 96–112)
Chloride: 100 mEq/L (ref 96–112)
Creatinine, Ser: 2.23 mg/dL — ABNORMAL HIGH (ref 0.50–1.35)
GFR calc Af Amer: 27 mL/min — ABNORMAL LOW (ref >90)
GFR calc Af Amer: 34 mL/min — ABNORMAL LOW (ref >90)
GFR calc non Af Amer: 24 mL/min — ABNORMAL LOW (ref >90)
GFR calc non Af Amer: 26 mL/min — ABNORMAL LOW (ref >90)
Glucose, Bld: 558 mg/dL (ref 70–99)
Glucose, Bld: 226 mg/dL — ABNORMAL HIGH (ref 70–99)
Potassium: 4.5 mEq/L (ref 3.5–5.1)
Potassium: 4.7 mEq/L (ref 3.5–5.1)
Potassium: 3.7 mEq/L (ref 3.5–5.1)
Sodium: 122 mEq/L — ABNORMAL LOW (ref 135–145)
Sodium: 131 mEq/L — ABNORMAL LOW (ref 135–145)
Sodium: 132 mEq/L — ABNORMAL LOW (ref 135–145)

## 2013-02-10 LAB — URINALYSIS, ROUTINE W REFLEX MICROSCOPIC
Leukocytes, UA: NEGATIVE
Nitrite: NEGATIVE
Specific Gravity, Urine: 1.025 (ref 1.005–1.030)
Urobilinogen, UA: 0.2 mg/dL (ref 0.0–1.0)
pH: 5 (ref 5.0–8.0)

## 2013-02-10 LAB — GLUCOSE, CAPILLARY
Glucose-Capillary: 566 mg/dL (ref 70–99)
Glucose-Capillary: 270 mg/dL — ABNORMAL HIGH (ref 70–99)
Glucose-Capillary: 213 mg/dL — ABNORMAL HIGH (ref 70–99)
Glucose-Capillary: 206 mg/dL — ABNORMAL HIGH (ref 70–99)

## 2013-02-10 LAB — URINE MICROSCOPIC-ADD ON

## 2013-02-10 LAB — POCT I-STAT 3, VENOUS BLOOD GAS (G3P V)
Acid-base deficit: 18 mmol/L — ABNORMAL HIGH (ref 0.0–2.0)
O2 Saturation: 41 %

## 2013-02-10 LAB — CBC
Hemoglobin: 14.9 g/dL (ref 13.0–17.0)
MCH: 31.5 pg (ref 26.0–34.0)
MCH: 31.5 pg (ref 26.0–34.0)
MCHC: 36.7 g/dL — ABNORMAL HIGH (ref 30.0–36.0)
MCV: 86.8 fL (ref 78.0–100.0)
Platelets: 356 10*3/uL (ref 150–400)
RBC: 4.25 MIL/uL (ref 4.22–5.81)
RDW: 12.8 % (ref 11.5–15.5)
RDW: 12.8 % (ref 11.5–15.5)
WBC: 15.4 10*3/uL — ABNORMAL HIGH (ref 4.0–10.5)

## 2013-02-10 MED ORDER — ONDANSETRON HCL 4 MG/2ML IJ SOLN: 4.0000 mg | Freq: Four times a day (QID) | INTRAMUSCULAR | Status: DC | PRN

## 2013-02-10 MED ORDER — ADULT MULTIVITAMIN W/MINERALS CH
1.0000 | ORAL_TABLET | Freq: Every day | ORAL | Status: DC
  Administered 2013-02-11 – 2013-02-14 (×4): 1 via ORAL
  Filled 2013-02-10 (×5): qty 1

## 2013-02-10 MED ORDER — SODIUM CHLORIDE 0.9 % IV SOLN
INTRAVENOUS | Status: DC
  Administered 2013-02-10: 5.1 [IU]/h via INTRAVENOUS
  Administered 2013-02-11: 2.5 [IU]/h via INTRAVENOUS
  Filled 2013-02-10: qty 1

## 2013-02-10 MED ORDER — OXYCODONE HCL 5 MG PO TABS
5.0000 mg | ORAL_TABLET | Freq: Four times a day (QID) | ORAL | Status: DC | PRN
  Administered 2013-02-10 – 2013-02-12 (×6): 5 mg via ORAL
  Filled 2013-02-10 (×6): qty 1

## 2013-02-10 MED ORDER — DOCUSATE SODIUM 100 MG PO CAPS
100.0000 mg | ORAL_CAPSULE | Freq: Two times a day (BID) | ORAL | Status: DC
  Administered 2013-02-11 – 2013-02-14 (×7): 100 mg via ORAL
  Filled 2013-02-10 (×10): qty 1

## 2013-02-10 MED ORDER — SODIUM CHLORIDE 0.9 % IJ SOLN: 3.0000 mL | Freq: Two times a day (BID) | INTRAMUSCULAR | Status: DC

## 2013-02-10 MED ORDER — ONDANSETRON HCL 4 MG PO TABS: 4.0000 mg | ORAL_TABLET | Freq: Four times a day (QID) | ORAL | Status: DC | PRN

## 2013-02-10 MED ORDER — PROPRANOLOL HCL 80 MG PO CP24: 80.0000 mg | ORAL_CAPSULE | Freq: Every day | ORAL | Status: DC

## 2013-02-10 MED ORDER — HEPARIN SODIUM (PORCINE) 5000 UNIT/ML IJ SOLN
5000.0000 [IU] | Freq: Three times a day (TID) | INTRAMUSCULAR | Status: DC
  Administered 2013-02-10 – 2013-02-14 (×11): 5000 [IU] via SUBCUTANEOUS
  Filled 2013-02-10 (×14): qty 1

## 2013-02-10 MED ORDER — DEXTROSE-NACL 5-0.45 % IV SOLN
INTRAVENOUS | Status: DC
  Administered 2013-02-10: 23:00:00 via INTRAVENOUS

## 2013-02-10 MED ORDER — SODIUM CHLORIDE 0.9 % IV SOLN
INTRAVENOUS | Status: DC
  Administered 2013-02-10: 19:00:00 via INTRAVENOUS

## 2013-02-10 MED ORDER — ASPIRIN EC 81 MG PO TBEC
81.0000 mg | DELAYED_RELEASE_TABLET | Freq: Every day | ORAL | Status: DC
  Administered 2013-02-11 – 2013-02-14 (×4): 81 mg via ORAL
  Filled 2013-02-10 (×5): qty 1

## 2013-02-10 MED ORDER — ALUM & MAG HYDROXIDE-SIMETH 200-200-20 MG/5ML PO SUSP
30.0000 mL | Freq: Four times a day (QID) | ORAL | Status: DC | PRN
  Administered 2013-02-10: 30 mL via ORAL
  Filled 2013-02-10: qty 30

## 2013-02-10 MED ORDER — MORPHINE SULFATE 4 MG/ML IJ SOLN
4.0000 mg | Freq: Once | INTRAMUSCULAR | Status: AC
  Administered 2013-02-10: 4 mg via INTRAVENOUS
  Filled 2013-02-10: qty 1

## 2013-02-10 MED ORDER — AMLODIPINE BESYLATE 5 MG PO TABS
5.0000 mg | ORAL_TABLET | Freq: Every day | ORAL | Status: DC
  Administered 2013-02-11 – 2013-02-13 (×3): 5 mg via ORAL
  Filled 2013-02-10 (×5): qty 1

## 2013-02-10 MED ORDER — IRBESARTAN 75 MG PO TABS
75.0000 mg | ORAL_TABLET | Freq: Every day | ORAL | Status: DC
  Filled 2013-02-10: qty 1

## 2013-02-10 MED ORDER — PROPRANOLOL HCL ER 80 MG PO CP24
80.0000 mg | ORAL_CAPSULE | Freq: Every day | ORAL | Status: DC
  Administered 2013-02-11 – 2013-02-14 (×4): 80 mg via ORAL
  Filled 2013-02-10 (×4): qty 1

## 2013-02-10 MED ORDER — SODIUM CHLORIDE 0.9 % IV SOLN: INTRAVENOUS | Status: DC

## 2013-02-10 MED ORDER — AMLODIPINE-ATORVASTATIN 5-40 MG PO TABS: 1.0000 | ORAL_TABLET | Freq: Every day | ORAL | Status: DC

## 2013-02-10 MED ORDER — ATORVASTATIN CALCIUM 40 MG PO TABS
40.0000 mg | ORAL_TABLET | Freq: Every day | ORAL | Status: DC
  Administered 2013-02-11 – 2013-02-13 (×3): 40 mg via ORAL
  Filled 2013-02-10 (×5): qty 1

## 2013-02-10 MED ORDER — SODIUM CHLORIDE 0.9 % IV BOLUS (SEPSIS)
2000.0000 mL | Freq: Once | INTRAVENOUS | Status: AC
  Administered 2013-02-10: 2000 mL via INTRAVENOUS

## 2013-02-10 MED ORDER — DEXTROSE 50 % IV SOLN: 25.0000 mL | INTRAVENOUS | Status: DC | PRN

## 2013-02-10 MED ORDER — INSULIN REGULAR BOLUS VIA INFUSION
0.0000 [IU] | Freq: Three times a day (TID) | INTRAVENOUS | Status: DC
  Filled 2013-02-10: qty 10

## 2013-02-10 NOTE — ED Provider Notes (Addendum)
CSN: [id number]     Arrival date & time 02/10/13  1225 History   First MD Initiated Contact with Patient 02/10/13 1456     Chief Complaint  Patient presents with  . Chest Pain  . Shortness of Breath  . Fatigue   (Consider location/radiation/quality/duration/timing/severity/associated sxs/prior Treatment) HPI Complains of right-sided chest pain, lateral aspect and right-sided flank pain onset 6 days ago. Symptoms accompanied by shortness of breath for the past 5 days. Denies cough denies fever. Admits to polyuria and polydipsia. No nausea or vomiting no fever. Pain is similar from pain he's had as a result of the stroke in 2007 he also reports numbness at his right chest and right flank since 2007, increase. No headache no vomiting no other associated symptoms pain is worse with lying on his right side improved when he reclines in a chair. No other associated symptoms. Past Medical History  Diagnosis Date  . Stroke   . Hypertension   . Asthma   . Heartburn    History reviewed. No pertinent past surgical history. No family history on file. History  Substance Use Topics  . Smoking status: Current Every Day Smoker  . Smokeless tobacco: Not on file  . Alcohol Use: Yes    Review of Systems  Constitutional: Negative.   HENT: Negative.   Respiratory: Positive for shortness of breath.   Cardiovascular: Positive for chest pain.  Gastrointestinal: Negative.   Endocrine: Positive for polydipsia and polyphagia.  Genitourinary: Positive for flank pain.  Musculoskeletal: Negative.   Skin: Negative.   Neurological: Negative.   Psychiatric/Behavioral: Negative.   All other systems reviewed and are negative.    Allergies  Review of patient's allergies indicates no known allergies.  Home Medications   Current Outpatient Rx  Name  Route  Sig  Dispense  Refill  . amLODipine-atorvastatin (CADUET) 5-40 MG per tablet   Oral   Take 1 tablet by mouth at bedtime.           .  propranolol (INDERAL LA) 80 MG 24 hr capsule   Oral   Take 80 mg by mouth daily.           . sildenafil (VIAGRA) 25 MG tablet   Oral   Take 25 mg by mouth daily as needed for erectile dysfunction.          . valsartan (DIOVAN) 80 MG tablet   Oral   Take 80 mg by mouth daily.            BP 145/105  Pulse 101  Temp(Src) 97.2 F (36.2 C) (Oral)  Resp 22  SpO2 100% Physical Exam  Nursing note and vitals reviewed. Constitutional: He appears well-developed and well-nourished.  HENT:  Head: Normocephalic and atraumatic.  Eyes: Conjunctivae are normal. Pupils are equal, round, and reactive to light.  Neck: Neck supple. No tracheal deviation present. No thyromegaly present.  Cardiovascular: Normal rate and regular rhythm.   No murmur heard. Pulmonary/Chest: Effort normal and breath sounds normal.  Abdominal: Soft. Bowel sounds are normal. He exhibits no distension. There is no tenderness.  Musculoskeletal: Normal range of motion. He exhibits no edema and no tenderness.  Neurological: He is alert. Coordination normal.  Skin: Skin is warm and dry. No rash noted.  Psychiatric: He has a normal mood and affect.    ED Course  Procedures (including critical care time) Labs Review Labs Reviewed  CBC - Abnormal; Notable for the following:    WBC 14.1 (*)  MCHC 36.7 (*)    All other components within normal limits  BASIC METABOLIC PANEL - Abnormal; Notable for the following:    Sodium 122 (*)    Chloride 84 (*)    CO2 7 (*)    Glucose, Bld 558 (*)    BUN 40 (*)    Creatinine, Ser 2.68 (*)    GFR calc non Af Amer 24 (*)    GFR calc Af Amer 27 (*)    All other components within normal limits  PRO B NATRIURETIC PEPTIDE - Abnormal; Notable for the following:    Pro B Natriuretic peptide (BNP) 464.4 (*)    All other components within normal limits  POCT I-STAT TROPONIN I   Imaging Review Dg Chest 2 View  02/10/2013   CLINICAL DATA:  Chest pain, shortness of Breath  EXAM:  CHEST  2 VIEW  COMPARISON:  09/20/2005  FINDINGS: Cardiomediastinal silhouette is stable. No acute infiltrate or pleural effusion. No pulmonary edema. Bony thorax is unremarkable.  IMPRESSION: No active cardiopulmonary disease.   Electronically Signed   By: Natasha Mead M.D.   On: 02/10/2013 13:50    EKG Interpretation    Date/Time:  Monday February 10 2013 12:32:06 EST Ventricular Rate:  111 PR Interval:  158 QRS Duration: 76 QT Interval:  336 QTC Calculation: 456 R Axis:   76 Text Interpretation:  Sinus tachycardia Possible Left atrial enlargement Cannot rule out Anterior infarct , age undetermined Abnormal ECG Confirmed by Ethelda Chick  MD, Miosha Behe ([id number]) on 02/10/2013 3:21:03 PM           Results for orders placed during the hospital encounter of 02/10/13  CBC      Result Value Range   WBC 14.1 (*) 4.0 - 10.5 K/uL   RBC 4.73  4.22 - 5.81 MIL/uL   Hemoglobin 14.9  13.0 - 17.0 g/dL   HCT 16.1  09.6 - 04.5 %   MCV 85.8  78.0 - 100.0 fL   MCH 31.5  26.0 - 34.0 pg   MCHC 36.7 (*) 30.0 - 36.0 g/dL   RDW 40.9  81.1 - 91.4 %   Platelets 363  150 - 400 K/uL  BASIC METABOLIC PANEL      Result Value Range   Sodium 122 (*) 135 - 145 mEq/L   Potassium 4.5  3.5 - 5.1 mEq/L   Chloride 84 (*) 96 - 112 mEq/L   CO2 7 (*) 19 - 32 mEq/L   Glucose, Bld 558 (*) 70 - 99 mg/dL   BUN 40 (*) 6 - 23 mg/dL   Creatinine, Ser 7.82 (*) 0.50 - 1.35 mg/dL   Calcium 9.4  8.4 - 95.6 mg/dL   GFR calc non Af Amer 24 (*) >90 mL/min   GFR calc Af Amer 27 (*) >90 mL/min  PRO B NATRIURETIC PEPTIDE      Result Value Range   Pro B Natriuretic peptide (BNP) 464.4 (*) 0 - 125 pg/mL  URINALYSIS, ROUTINE W REFLEX MICROSCOPIC      Result Value Range   Color, Urine YELLOW  YELLOW   APPearance HAZY (*) CLEAR   Specific Gravity, Urine 1.025  1.005 - 1.030   pH 5.0  5.0 - 8.0   Glucose, UA >1000 (*) NEGATIVE mg/dL   Hgb urine dipstick MODERATE (*) NEGATIVE   Bilirubin Urine MODERATE (*) NEGATIVE   Ketones, ur >80  (*) NEGATIVE mg/dL   Protein, ur 30 (*) NEGATIVE mg/dL   Urobilinogen, UA 0.2  0.0 -  1.0 mg/dL   Nitrite NEGATIVE  NEGATIVE   Leukocytes, UA NEGATIVE  NEGATIVE  GLUCOSE, CAPILLARY      Result Value Range   Glucose-Capillary 566 (*) 70 - 99 mg/dL   Comment 1 Notify RN     Comment 2 Documented in Chart    URINE MICROSCOPIC-ADD ON      Result Value Range   WBC, UA 0-2  <3 WBC/hpf   RBC / HPF 3-6  <3 RBC/hpf   Bacteria, UA FEW (*) RARE   Casts HYALINE CASTS (*) NEGATIVE   Urine-Other AMORPHOUS URATES/PHOSPHATES    POCT I-STAT TROPONIN I      Result Value Range   Troponin i, poc 0.06  0.00 - 0.08 ng/mL   Comment 3           POCT I-STAT 3, BLOOD GAS (G3P V)      Result Value Range   pH, Ven 7.154 (*) 7.250 - 7.300   pCO2, Ven 26.4 (*) 45.0 - 50.0 mmHg   pO2, Ven 29.0 (*) 30.0 - 45.0 mmHg   Bicarbonate 9.3 (*) 20.0 - 24.0 mEq/L   TCO2 10  0 - 100 mmol/L   O2 Saturation 41.0     Acid-base deficit 18.0 (*) 0.0 - 2.0 mmol/L   Sample type VENOUS     Comment NOTIFIED PHYSICIAN     Dg Chest 2 View  02/10/2013   CLINICAL DATA:  Chest pain, shortness of Breath  EXAM: CHEST  2 VIEW  COMPARISON:  09/20/2005  FINDINGS: Cardiomediastinal silhouette is stable. No acute infiltrate or pleural effusion. No pulmonary edema. Bony thorax is unremarkable.  IMPRESSION: No active cardiopulmonary disease.   Electronically Signed   By: Natasha Mead M.D.   On: 02/10/2013 13:50    Chest xray viewed by me 16 10 PM pain improved after treatment with intravenous morphine. He remains alert appropriate. 1640 p.m. he is requesting more pain medicine. Additional morphine ordered. Dr.KadakiA arrived and took over care of patient 1655 p.m. MDM  No diagnosis found.   Plan intravenous insulin drip, iv fluids electrolyte correction, pain control Diagnosis #1diabetic ketoacidosis #2 renal insufficiency Lab work , old hospital records reviewed CRITICAL CARE Performed by: Doug Sou Total critical care time: 30  minute Critical care time was exclusive of separately billable procedures and treating other patients. Critical care was necessary to treat or prevent imminent or life-threatening deterioration. Critical care was time spent personally by me on the following activities: development of treatment plan with patient and/or surrogate as well as nursing, discussions with consultants, evaluation of patient's response to treatment, examination of patient, obtaining history from patient or surrogate, ordering and performing treatments and interventions, ordering and review of laboratory studies, ordering and review of radiographic studies, pulse oximetry and re-evaluation of patient's condition.   Doug Sou, MD 02/10/13 1700  Doug Sou, MD 02/10/13 [phone]

## 2013-02-10 NOTE — ED Notes (Signed)
Pt states R side CP with SOB for 6 days.  Pt states he feels generally fatigued.  Pt describes pain as constant numbness with tightening sensation.

## 2013-02-10 NOTE — ED Notes (Signed)
Attempted to call report to floor. RN receiving report on other patient. RN to call back.

## 2013-02-10 NOTE — ED Notes (Signed)
Pt denies recreational drug use.  Pt's wife asked pt if he wanted to reconsider his answer.  Pt stated he did not.  Pt is not a good historian and not forthcoming with much information.

## 2013-02-10 NOTE — H&P (Signed)
[name] is an 62 y.o. male.   Chief Complaint: Chest pain and shortness of breath. HPI: 62 year old male with 1 week of right sided flank pain and loss of appetite has chest pain and shortness of breath without fever, cough and cold. Also has shortness of breath x 5 days. No headache or vomiting. H/O stroke in 2007 with somewhat similar symptoms.  Past Medical History  Diagnosis Date  . Stroke   . Hypertension   . Asthma   . Heartburn       History reviewed. No pertinent past surgical history. Right knee medial meniscal tear and chondromalacia of the patella.-2005   No family history on file. Social History:  reports that he has been smoking.  He does not have any smokeless tobacco history on file. He reports that he drinks alcohol. He reports that he does not use illicit drugs.  Allergies: No Known Allergies   (Not in a hospital admission)  Results for orders placed during the hospital encounter of 02/10/13 (from the past 48 hour(s))  CBC     Status: Abnormal   Collection Time    02/10/13 12:39 PM      Result Value Range   WBC 14.1 (*) 4.0 - 10.5 K/uL   RBC 4.73  4.22 - 5.81 MIL/uL   Hemoglobin 14.9  13.0 - 17.0 g/dL   HCT 09.8  11.9 - 14.7 %   MCV 85.8  78.0 - 100.0 fL   MCH 31.5  26.0 - 34.0 pg   MCHC 36.7 (*) 30.0 - 36.0 g/dL   RDW 82.9  56.2 - 13.0 %   Platelets 363  150 - 400 K/uL  BASIC METABOLIC PANEL     Status: Abnormal   Collection Time    02/10/13 12:39 PM      Result Value Range   Sodium 122 (*) 135 - 145 mEq/L   Potassium 4.5  3.5 - 5.1 mEq/L   Chloride 84 (*) 96 - 112 mEq/L   CO2 7 (*) 19 - 32 mEq/L   Comment: CRITICAL RESULT CALLED TO, READ BACK BY AND VERIFIED WITH:     A.KIKER,RN 02/10/13 1355 BY BSLADE   Glucose, Bld 558 (*) 70 - 99 mg/dL   Comment: CRITICAL RESULT CALLED TO, READ BACK BY AND VERIFIED WITH:     A.KIKER,RN 02/10/13 1355 BY BSLADE   BUN 40 (*) 6 - 23 mg/dL   Creatinine, Ser 8.65 (*) 0.50 - 1.35 mg/dL   Calcium 9.4  8.4 -  78.4 mg/dL   GFR calc non Af Amer 24 (*) >90 mL/min   GFR calc Af Amer 27 (*) >90 mL/min   Comment: (NOTE)     The eGFR has been calculated using the CKD EPI equation.     This calculation has not been validated in all clinical situations.     eGFR's persistently <90 mL/min signify possible Chronic Kidney     Disease.  PRO B NATRIURETIC PEPTIDE     Status: Abnormal   Collection Time    02/10/13 12:39 PM      Result Value Range   Pro B Natriuretic peptide (BNP) 464.4 (*) 0 - 125 pg/mL  POCT I-STAT TROPONIN I     Status: None   Collection Time    02/10/13  1:04 PM      Result Value Range   Troponin i, poc 0.06  0.00 - 0.08 ng/mL   Comment 3  Comment: Due to the release kinetics of cTnI,     a negative result within the first hours     of the onset of symptoms does not rule out     myocardial infarction with certainty.     If myocardial infarction is still suspected,     repeat the test at appropriate intervals.  GLUCOSE, CAPILLARY     Status: Abnormal   Collection Time    02/10/13  3:34 PM      Result Value Range   Glucose-Capillary 566 (*) 70 - 99 mg/dL   Comment 1 Notify RN     Comment 2 Documented in Chart    URINALYSIS, ROUTINE W REFLEX MICROSCOPIC     Status: Abnormal   Collection Time    02/10/13  4:25 PM      Result Value Range   Color, Urine YELLOW  YELLOW   APPearance HAZY (*) CLEAR   Specific Gravity, Urine 1.025  1.005 - 1.030   pH 5.0  5.0 - 8.0   Glucose, UA >1000 (*) NEGATIVE mg/dL   Hgb urine dipstick MODERATE (*) NEGATIVE   Bilirubin Urine MODERATE (*) NEGATIVE   Ketones, ur >80 (*) NEGATIVE mg/dL   Protein, ur 30 (*) NEGATIVE mg/dL   Urobilinogen, UA 0.2  0.0 - 1.0 mg/dL   Nitrite NEGATIVE  NEGATIVE   Leukocytes, UA NEGATIVE  NEGATIVE  URINE MICROSCOPIC-ADD ON     Status: Abnormal   Collection Time    02/10/13  4:25 PM      Result Value Range   WBC, UA 0-2  <3 WBC/hpf   RBC / HPF 3-6  <3 RBC/hpf   Bacteria, UA FEW (*) RARE   Casts  HYALINE CASTS (*) NEGATIVE   Comment: GRANULAR CAST   Urine-Other AMORPHOUS URATES/PHOSPHATES    POCT I-STAT 3, BLOOD GAS (G3P V)     Status: Abnormal   Collection Time    02/10/13  4:44 PM      Result Value Range   pH, Ven 7.154 (*) 7.250 - 7.300   pCO2, Ven 26.4 (*) 45.0 - 50.0 mmHg   pO2, Ven 29.0 (*) 30.0 - 45.0 mmHg   Bicarbonate 9.3 (*) 20.0 - 24.0 mEq/L   TCO2 10  0 - 100 mmol/L   O2 Saturation 41.0     Acid-base deficit 18.0 (*) 0.0 - 2.0 mmol/L   Sample type VENOUS     Comment NOTIFIED PHYSICIAN    GLUCOSE, CAPILLARY     Status: Abnormal   Collection Time    02/10/13  4:56 PM      Result Value Range   Glucose-Capillary 481 (*) 70 - 99 mg/dL  GLUCOSE, CAPILLARY     Status: Abnormal   Collection Time    02/10/13  6:03 PM      Result Value Range   Glucose-Capillary 424 (*) 70 - 99 mg/dL   Dg Chest 2 View  16/11/9602   CLINICAL DATA:  Chest pain, shortness of Breath  EXAM: CHEST  2 VIEW  COMPARISON:  09/20/2005  FINDINGS: Cardiomediastinal silhouette is stable. No acute infiltrate or pleural effusion. No pulmonary edema. Bony thorax is unremarkable.  IMPRESSION: No active cardiopulmonary disease.   Electronically Signed   By: Natasha Mead M.D.   On: 02/10/2013 13:50    ROS GENERAL: Positive fevers, night sweats.  RESPIRATORY: Positive chest pain, positive shortness of breath. No cough.  ABDOMINAL: Positive abdominal pain, positive vomiting. No diarrhea.  GENITOURINARY: Urinary frequency, positive urinary  urgency. No decreased stream.  NEUROLOGIC: Positive right-sided weakness and blind spots on right side from stroke. PSYCHIATRIC: Positive depression, positive trouble sleeping.  Blood pressure 170/94, pulse 96, temperature 98 F (36.7 C), temperature source Oral, resp. rate 17, SpO2 100.00%. GENERAL: He is alert, in no acute distress.  HEENT: Brown eyes, pupils equal and reactive to light. Tongue pink and dry. RESPIRATORY: Clear to auscultation bilaterally. Pain  reproducible with inspiration and palpation.  CARDIOVASCULAR: Tachycardic, regular rhythm. Pain not producible with palpation.  ABDOMEN: Soft. Mildly tender in right upper quadrant. Positive bowel sounds. Some voluntary guarding.  EXTREMITIES: No edema. Good pulses.  NEUROLOGIC: A x O x 2, Mild right sided weakness. Psychiatric: Alert but some anxiety and depression.  Assessment/Plan Diabetic ketoacidosis-New Dehydration Obesity Hypertension Hyponatremia Chest pain, right sided Abdominal pain  Admit/IV fluids/Home medications. IV Insulin  Anniah Glick S 02/10/2013, 6:51 PM

## 2013-02-10 NOTE — ED Notes (Signed)
Attempted to call report. RN in patient room. RN to call back.

## 2013-02-10 NOTE — ED Notes (Signed)
istat blood gas shown to [employer]

## 2013-02-11 LAB — BASIC METABOLIC PANEL
BUN: 35 mg/dL — ABNORMAL HIGH (ref 6–23)
BUN: 34 mg/dL — ABNORMAL HIGH (ref 6–23)
CO2: 13 mEq/L — ABNORMAL LOW (ref 19–32)
CO2: 15 mEq/L — ABNORMAL LOW (ref 19–32)
CO2: 16 mEq/L — ABNORMAL LOW (ref 19–32)
Calcium: 8.6 mg/dL (ref 8.4–10.5)
Calcium: 8.6 mg/dL (ref 8.4–10.5)
Chloride: 100 mEq/L (ref 96–112)
Chloride: 99 mEq/L (ref 96–112)
Chloride: 99 mEq/L (ref 96–112)
Chloride: 101 mEq/L (ref 96–112)
Creatinine, Ser: 2.18 mg/dL — ABNORMAL HIGH (ref 0.50–1.35)
Creatinine, Ser: 2.16 mg/dL — ABNORMAL HIGH (ref 0.50–1.35)
Creatinine, Ser: 2.05 mg/dL — ABNORMAL HIGH (ref 0.50–1.35)
GFR calc Af Amer: 38 mL/min — ABNORMAL LOW (ref >90)
Glucose, Bld: 102 mg/dL — ABNORMAL HIGH (ref 70–99)
Glucose, Bld: 99 mg/dL (ref 70–99)
Glucose, Bld: 108 mg/dL — ABNORMAL HIGH (ref 70–99)
Potassium: 3.8 mEq/L (ref 3.7–5.3)
Potassium: 4.2 mEq/L (ref 3.7–5.3)
Potassium: 3.9 mEq/L (ref 3.7–5.3)
Potassium: 4.1 mEq/L (ref 3.7–5.3)
Sodium: 132 mEq/L — ABNORMAL LOW (ref 137–147)
Sodium: 132 mEq/L — ABNORMAL LOW (ref 137–147)

## 2013-02-11 LAB — GLUCOSE, CAPILLARY
Glucose-Capillary: 96 mg/dL (ref 70–99)
Glucose-Capillary: 107 mg/dL — ABNORMAL HIGH (ref 70–99)
Glucose-Capillary: 96 mg/dL (ref 70–99)
Glucose-Capillary: 111 mg/dL — ABNORMAL HIGH (ref 70–99)
Glucose-Capillary: 157 mg/dL — ABNORMAL HIGH (ref 70–99)
Glucose-Capillary: 177 mg/dL — ABNORMAL HIGH (ref 70–99)
Glucose-Capillary: 187 mg/dL — ABNORMAL HIGH (ref 70–99)
Glucose-Capillary: 225 mg/dL — ABNORMAL HIGH (ref 70–99)

## 2013-02-11 LAB — TROPONIN I
Troponin I: <0.3 ng/mL (ref <0.30)
Troponin I: <0.3 ng/mL (ref <0.30)

## 2013-02-11 LAB — CBC
HCT: 36.9 % — ABNORMAL LOW (ref 39.0–52.0)
Hemoglobin: 13 g/dL (ref 13.0–17.0)
MCH: 30.6 pg (ref 26.0–34.0)
Platelets: 365 10*3/uL (ref 150–400)
RBC: 4.25 MIL/uL (ref 4.22–5.81)
WBC: 13.2 10*3/uL — ABNORMAL HIGH (ref 4.0–10.5)

## 2013-02-11 LAB — HEMOGLOBIN A1C: Hgb A1c MFr Bld: 12.9 % — ABNORMAL HIGH (ref <5.7)

## 2013-02-11 MED ORDER — LIVING WELL WITH DIABETES BOOK
Freq: Once | Status: AC
  Administered 2013-02-11: 12:00:00
  Filled 2013-02-11: qty 1

## 2013-02-11 MED ORDER — "BD GETTING STARTED TAKE HOME KIT: 1ML X 30 G SYRINGES, "
1.0000 | Freq: Once | Status: AC
  Administered 2013-02-11: 1
  Filled 2013-02-11: qty 1

## 2013-02-11 MED ORDER — BD GETTING STARTED TAKE HOME KIT: 1/2ML X 30G SYRINGES
1.0000 | Freq: Once | Status: DC
  Filled 2013-02-11: qty 1

## 2013-02-11 MED ORDER — INSULIN ASPART 100 UNIT/ML ~~LOC~~ SOLN
3.0000 [IU] | Freq: Once | SUBCUTANEOUS | Status: AC
  Administered 2013-02-11: 3 [IU] via SUBCUTANEOUS

## 2013-02-11 MED ORDER — INSULIN ASPART 100 UNIT/ML ~~LOC~~ SOLN
0.0000 [IU] | Freq: Three times a day (TID) | SUBCUTANEOUS | Status: DC
  Administered 2013-02-11: 3 [IU] via SUBCUTANEOUS
  Administered 2013-02-11: 2 [IU] via SUBCUTANEOUS
  Administered 2013-02-12: 5 [IU] via SUBCUTANEOUS
  Administered 2013-02-12: 9 [IU] via SUBCUTANEOUS
  Administered 2013-02-13: 5 [IU] via SUBCUTANEOUS
  Administered 2013-02-13: 3 [IU] via SUBCUTANEOUS
  Administered 2013-02-13: 7 [IU] via SUBCUTANEOUS
  Administered 2013-02-14: 2 [IU] via SUBCUTANEOUS

## 2013-02-11 MED ORDER — INSULIN GLARGINE 100 UNIT/ML ~~LOC~~ SOLN
15.0000 [IU] | Freq: Every day | SUBCUTANEOUS | Status: DC
  Administered 2013-02-11: 15 [IU] via SUBCUTANEOUS
  Filled 2013-02-11 (×3): qty 0.15

## 2013-02-11 MED ORDER — INSULIN ASPART 100 UNIT/ML ~~LOC~~ SOLN
3.0000 [IU] | Freq: Three times a day (TID) | SUBCUTANEOUS | Status: DC
  Administered 2013-02-11 – 2013-02-14 (×7): 3 [IU] via SUBCUTANEOUS

## 2013-02-11 MED ORDER — SODIUM CHLORIDE 0.9 % IV SOLN
INTRAVENOUS | Status: DC
  Administered 2013-02-11 – 2013-02-13 (×3): via INTRAVENOUS

## 2013-02-11 NOTE — Progress Notes (Signed)
Utilization review completed.  

## 2013-02-11 NOTE — Progress Notes (Signed)
Subjective:  Feeling better. Improving sugar control.   Objective:  Vital Signs in the last 24 hours: Temp:  [97.2 F (36.2 C)-98.7 F (37.1 C)] 98.7 F (37.1 C) (12/30 0728) Pulse Rate:  [84-111] 84 (12/30 0346) Cardiac Rhythm:  [-] Normal sinus rhythm (12/30 0346) Resp:  [17-31] 17 (12/30 0346) BP: (129-174)/(76-105) 147/76 mmHg (12/30 0346) SpO2:  [97 %-100 %] 98 % (12/30 0346) Weight:  [255 lb 15.3 oz (116.1 kg)-257 lb 0.9 oz (116.6 kg)] 257 lb 0.9 oz (116.6 kg) (12/30 0438)  Physical Exam: BP Readings from Last 1 Encounters:  02/11/13 147/76     Wt Readings from Last 1 Encounters:  02/11/13 257 lb 0.9 oz (116.6 kg)    Weight change:   HEENT: /AT, Eyes-Brown, PERL, EOMI, Conjunctiva-Pink, Sclera-Non-icteric Neck: No JVD, No bruit, Trachea midline. Lungs:  Clear, Bilateral. Cardiac:  Regular rhythm, normal S1 and S2, no S3.  Abdomen:  Soft, non-tender. Extremities:  No edema present. No cyanosis. No clubbing. CNS: AxOx3, Cranial nerves grossly intact, moves all 4 extremities. Mild right sided weakness. Skin: Warm and dry.   Intake/Output from previous day: 12/29 0701 - 12/30 0700 In: 380 [I.V.:380] Out: -     Lab Results: BMET    Component Value Date/Time   NA 133* 02/11/2013 0440   K 3.9 02/11/2013 0440   CL 99 02/11/2013 0440   CO2 14* 02/11/2013 0440   GLUCOSE 108* 02/11/2013 0440   BUN 34* 02/11/2013 0440   CREATININE 2.16* 02/11/2013 0440   CALCIUM 8.5 02/11/2013 0440   GFRNONAA 31* 02/11/2013 0440   GFRAA 35* 02/11/2013 0440   CBC    Component Value Date/Time   WBC 13.2* 02/11/2013 0440   RBC 4.25 02/11/2013 0440   HGB 13.0 02/11/2013 0440   HCT 36.9* 02/11/2013 0440   PLT 365 02/11/2013 0440   MCV 86.8 02/11/2013 0440   MCH 30.6 02/11/2013 0440   MCHC 35.2 02/11/2013 0440   RDW 12.8 02/11/2013 0440   LYMPHSABS 2.2 04/13/2008 2030   MONOABS 0.8 04/13/2008 2030   EOSABS 0.1 04/13/2008 2030   BASOSABS 0.0 04/13/2008 2030   CARDIAC ENZYMES Lab  Results  Component Value Date   TROPONINI <0.30 02/11/2013    Scheduled Meds: . amLODipine  5 mg Oral QHS   And  . atorvastatin  40 mg Oral QHS  . aspirin EC  81 mg Oral Daily  . docusate sodium  100 mg Oral BID  . heparin  5,000 Units Subcutaneous Q8H  . insulin aspart  0-9 Units Subcutaneous TID WC  . insulin aspart  3 Units Subcutaneous TID WC  . insulin glargine  15 Units Subcutaneous Daily  . multivitamin with minerals  1 tablet Oral Daily  . propranolol ER  80 mg Oral Daily  . sodium chloride  3 mL Intravenous Q12H   Continuous Infusions: . sodium chloride    . dextrose 5 % and 0.45% NaCl 10 mL/hr at 02/10/13 2259   PRN Meds:.alum & mag hydroxide-simeth, dextrose, ondansetron (ZOFRAN) IV, ondansetron, oxyCODONE  Assessment/Plan: Diabetic ketoacidosis-New  Dehydration  Obesity  Hypertension  Hyponatremia  Chest pain, right sided  Abdominal pain Acute Renal Failure   IV fluids Change to SQ insulin. Advance diet as tolerated. DC Avapro due to renal dysfunction.   LOS: 1 day    Orpah Cobb  MD  02/11/2013, 9:31 AM

## 2013-02-11 NOTE — Progress Notes (Signed)
Inpatient Diabetes Program Recommendations  AACE/ADA: New Consensus Statement on Inpatient Glycemic Control (2013)  Target Ranges:  Prepandial:   less than 140 mg/dL      Peak postprandial:   less than 180 mg/dL (1-2 hours)      Critically ill patients:  140 - 180 mg/dL   Reason for Visit: DKA Admission, New to diabetes  Note:  In process of transitioning off insulin drip.  Anion GAP still elevated at 20, but acute renal failure is probably contributing to elevation.  Close friend at bedside.  Friend lives in the [place] area, but wants to be involved with patient's care and follow-up as they have been friends since age 67.  Patient has no questions at present-- reassured patient that if he feels overwhelmed with the diagnosis, this is a normal feeling.  Stressed need for him to learn basic diabetes survival skills from nursing staff before discharge.  Will need to talk with a dietitian (consult placed), learn to prepare and give insulin, watch instructional videos on [employer], etc.  Ordered a Living Well with Diabetes booklet and insulin starter kit for him.  Will place order [facility] outpatient diabetes [facility].  Patient needs a PCP for follow-up at discharge.  Will need Rx for glucose meter at discharge.   Thank you.  Farheen Pfahler S. Elsie Lincoln, RN, CNS, CDE Inpatient Diabetes Program, team pager [phone]

## 2013-02-11 NOTE — Progress Notes (Signed)
Inpatient Diabetes Program Recommendations  AACE/ADA: New Consensus Statement on Inpatient Glycemic Control (2013)  Target Ranges:  Prepandial:   less than 140 mg/dL      Peak postprandial:   less than 180 mg/dL (1-2 hours)      Critically ill patients:  140 - 180 mg/dL   Reason for Visit: F/U request by family to address diabetes  Note:  Received a call from nurse requesting Diabetes Coordinator return and talk with patient's son.  Lengthy discussion with son, friend Larita Fife, friend [name], sister, and brother-in-law in 3300 waiting area.  Addressed various questions and concerns.  Son (who lives in [place]) plans to contact patient's HMO to determine which meter, which basal insulin and which rapid-acting insulins are preferred.  Will also find out if insulin pens are adequately covered.    Son, Hilda Lias, and Larita Fife state that patient had vision problems prior to admission.  Sounds as if he has lost some of his vision field.  This affects his ability to read, etc.  [name] has offered for patient to stay at her home for a while after discharge so she can assist with insulin prep and administration.  Tomorrow either Hilda Lias or Larita Fife will call the Diabetes Coordinator office with information obtained from the HMO.  If insulin pens are covered, this would be best option for him.  Right now, Hilda Lias is having difficulty understanding diabetes in general.  Will be helpful for her to watch the Pt Ed Channel.  Patient may need short-term Home Health follow-up after discharge to make sure he and Hilda Lias fully understand insulin, monitoring, etc.   Will place Care Management consult to address need for PCP and possible need for Home Health.  A Diabetes Coordinator will follow-up tomorrow to address further questions and find out insurance coverage preferences.  Outpatient diabetes education consult placed with [facility].  Marie's telephone number included.  Also asked to  make sure Home Health is not active when appointment is set.  Thank you.  Avaleen Brownley S. Elsie Lincoln, RN, CNS, CDE Inpatient Diabetes Program, team pager [phone]

## 2013-02-11 NOTE — Progress Notes (Signed)
Insulin drip stopped at 1245. D5 1/2NS stopped at 1245. Lantus given with 1000 meds. SSI + scheduled insulin total 5 units given. Patient gave self injection in abd, tolerated well. NS started at 49ml/hr. Will continue to monitor. Explained signs and symptoms of hypoglycemia to patient and family member, explained if having any S&S call RN.

## 2013-02-11 NOTE — Progress Notes (Signed)
Pt transferred from ED with RN. Time for CBG per protocol, will assess. Pt awake ax4 and able to walk over to bed. VSS, will continue to monitor. Pt Son and close friend at bedside, care explained to pt and family.

## 2013-02-11 NOTE — Progress Notes (Signed)
Echo Lab  2D Echocardiogram completed.  [name] [name], RDCS 02/11/2013 3:54 PM

## 2013-02-11 NOTE — Plan of Care (Signed)
Problem: Food- and Nutrition-Related Knowledge Deficit (NB-1.1) Goal: Nutrition education Formal process to instruct or train a patient/client in a skill or to impart knowledge to help patients/clients voluntarily manage or modify food choices and eating behavior to maintain or improve health. Outcome: Completed/Met Date Met:  02/11/13  RD consulted for nutrition education regarding new onset diabetes. Patient currently sleeping, had a busy morning per RN. Spoke with patient's significant other, sister, brother-in-law, and friend.  RD provided "Carbohydrate Counting for People with Diabetes" handout from the Academy of Nutrition and Dietetics. Discussed different food groups and their effects on blood sugar, emphasizing carbohydrate-containing foods. Provided list of carbohydrates and recommended serving sizes of common foods.  Discussed importance of controlled and consistent carbohydrate intake throughout the day. Provided examples of ways to balance meals/snacks and encouraged intake of high-fiber, whole grain complex carbohydrates. Teach back method used.  Expect good compliance.  Body mass index is 32.13 kg/(m^2). Pt meets criteria for obesity, class 1 based on current BMI.  Current diet order is full liquids, patient is consuming approximately 50% of meals at this time. Labs and medications reviewed. No further nutrition interventions warranted at this time. RD contact information provided. If additional nutrition issues arise, please re-consult RD.  Recommend OP diabetes diet education.  Joaquin Courts, RD, LDN, CNSC Pager [phone] After Hours Pager [phone]

## 2013-02-12 LAB — CBC
HCT: 34.7 % — ABNORMAL LOW (ref 39.0–52.0)
MCH: 30.4 pg (ref 26.0–34.0)
MCHC: 35.4 g/dL (ref 30.0–36.0)
MCV: 85.7 fL (ref 78.0–100.0)
Platelets: 343 10*3/uL (ref 150–400)
RDW: 12.7 % (ref 11.5–15.5)
WBC: 10.5 10*3/uL (ref 4.0–10.5)

## 2013-02-12 LAB — COMPREHENSIVE METABOLIC PANEL
ALT: 17 U/L (ref 0–53)
AST: 17 U/L (ref 0–37)
Albumin: 2.5 g/dL — ABNORMAL LOW (ref 3.5–5.2)
Alkaline Phosphatase: 78 U/L (ref 39–117)
BUN: 26 mg/dL — ABNORMAL HIGH (ref 6–23)
CO2: 18 mEq/L — ABNORMAL LOW (ref 19–32)
Chloride: 103 mEq/L (ref 96–112)
GFR calc Af Amer: 39 mL/min — ABNORMAL LOW (ref >90)
Potassium: 3.5 mEq/L — ABNORMAL LOW (ref 3.7–5.3)
Sodium: 135 mEq/L — ABNORMAL LOW (ref 137–147)
Total Bilirubin: 0.3 mg/dL (ref 0.3–1.2)
Total Protein: 6.9 g/dL (ref 6.0–8.3)

## 2013-02-12 LAB — GLUCOSE, CAPILLARY: Glucose-Capillary: 371 mg/dL — ABNORMAL HIGH (ref 70–99)

## 2013-02-12 MED ORDER — POTASSIUM CHLORIDE CRYS ER 10 MEQ PO TBCR
10.0000 meq | EXTENDED_RELEASE_TABLET | Freq: Two times a day (BID) | ORAL | Status: AC
  Administered 2013-02-12 – 2013-02-13 (×4): 10 meq via ORAL
  Filled 2013-02-12 (×5): qty 1

## 2013-02-12 MED ORDER — GLIPIZIDE ER 5 MG PO TB24
5.0000 mg | ORAL_TABLET | Freq: Every day | ORAL | Status: DC
  Administered 2013-02-12 – 2013-02-14 (×3): 5 mg via ORAL
  Filled 2013-02-12 (×4): qty 1

## 2013-02-12 MED ORDER — METFORMIN HCL 500 MG PO TABS: 500.0000 mg | ORAL_TABLET | Freq: Two times a day (BID) | ORAL | Status: DC

## 2013-02-12 MED ORDER — INSULIN GLARGINE 100 UNIT/ML ~~LOC~~ SOLN
20.0000 [IU] | Freq: Every day | SUBCUTANEOUS | Status: DC
  Administered 2013-02-12: 20 [IU] via SUBCUTANEOUS
  Filled 2013-02-12 (×2): qty 0.2

## 2013-02-12 NOTE — Progress Notes (Signed)
Visited pt and girlfriend and brother to assess further educational needs. Pt is comfortable going home on insulin and states he knows how to draw up and administer. States RN's have been teaching and "It's all done" He has not yet demonstrated how to check a cbg, but he states that is next. Answered various questions regarding diet and fiber and portion sizes to both pt and girlfriend.   Informed them that the OP education order has been entered and that [facility] will call (probably next week?) to schedule first appt. Pt and girlfriend very accepting and eager to learn.  Gave them contact info to our office and to the [facility].  They state they know where the [facility] is located. Pt hopeful to go home tomorrow.  Talked about weight loss as beneficient for him, esp since his brother states he no longer has to take even Metformin since he lost 40 lbs: Thank you, Lenor Coffin, RN, CNS, Diabetes Coordinator [phone])

## 2013-02-12 NOTE — Progress Notes (Signed)
Subjective:  Feeling better. Elevated blood sugars. Low potassium. T max 99 F  Objective:  Vital Signs in the last 24 hours: Temp:  [97.5 F (36.4 C)-99.1 F (37.3 C)] 99 F (37.2 C) (12/31 0344) Pulse Rate:  [73-87] 74 (12/31 0344) Cardiac Rhythm:  [-] Normal sinus rhythm (12/31 0344) Resp:  [13-15] 13 (12/31 0344) BP: (115-150)/(69-88) 131/69 mmHg (12/31 0344) SpO2:  [99 %-100 %] 100 % (12/31 0344) Weight:  [257 lb 0.9 oz (116.6 kg)] 257 lb 0.9 oz (116.6 kg) (12/31 0344)  Physical Exam: BP Readings from Last 1 Encounters:  02/12/13 131/69     Wt Readings from Last 1 Encounters:  02/12/13 257 lb 0.9 oz (116.6 kg)    Weight change: 1 lb 1.6 oz (0.5 kg)  HEENT: [place]/AT, Eyes-Brown, PERL, EOMI, Conjunctiva-Pink, Sclera-Non-icteric Neck: No JVD, No bruit, Trachea midline. Lungs:  Clear, Bilateral. Cardiac:  Regular rhythm, normal S1 and S2, no S3.  Abdomen:  Soft, non-tender. Extremities:  No edema present. No cyanosis. No clubbing. CNS: AxOx3, Cranial nerves grossly intact, moves all 4 extremities. Mild right sided weakness. Skin: Warm and dry.   Intake/Output from previous day: 12/30 0701 - 12/31 0700 In: 2157.5 [P.O.:960; I.V.:1197.5] Out: 1500 [Urine:1500]    Lab Results: BMET    Component Value Date/Time   NA 135* 02/12/2013 0408   K 3.5* 02/12/2013 0408   CL 103 02/12/2013 0408   CO2 18* 02/12/2013 0408   GLUCOSE 192* 02/12/2013 0408   BUN 26* 02/12/2013 0408   CREATININE 2.00* 02/12/2013 0408   CALCIUM 8.2* 02/12/2013 0408   GFRNONAA 34* 02/12/2013 0408   GFRAA 39* 02/12/2013 0408   CBC    Component Value Date/Time   WBC 10.5 02/12/2013 0408   RBC 4.05* 02/12/2013 0408   HGB 12.3* 02/12/2013 0408   HCT 34.7* 02/12/2013 0408   PLT 343 02/12/2013 0408   MCV 85.7 02/12/2013 0408   MCH 30.4 02/12/2013 0408   MCHC 35.4 02/12/2013 0408   RDW 12.7 02/12/2013 0408   LYMPHSABS 2.2 04/13/2008 2030   MONOABS 0.8 04/13/2008 2030   EOSABS 0.1 04/13/2008 2030   BASOSABS 0.0 04/13/2008 2030   CARDIAC ENZYMES Lab Results  Component Value Date   TROPONINI <0.30 02/11/2013    Scheduled Meds: . amLODipine  5 mg Oral QHS   And  . atorvastatin  40 mg Oral QHS  . aspirin EC  81 mg Oral Daily  . docusate sodium  100 mg Oral BID  . glipiZIDE  5 mg Oral Q breakfast  . heparin  5,000 Units Subcutaneous Q8H  . insulin aspart  0-9 Units Subcutaneous TID WC  . insulin aspart  3 Units Subcutaneous TID WC  . insulin glargine  20 Units Subcutaneous Daily  . multivitamin with minerals  1 tablet Oral Daily  . potassium chloride  10 mEq Oral BID  . propranolol ER  80 mg Oral Daily  . sodium chloride  3 mL Intravenous Q12H   Continuous Infusions: . sodium chloride 75 mL/hr at 02/11/13 1242  . dextrose 5 % and 0.45% NaCl 10 mL/hr at 02/10/13 2259   PRN Meds:.alum & mag hydroxide-simeth, dextrose, ondansetron (ZOFRAN) IV, ondansetron, oxyCODONE  Assessment/Plan: Diabetic ketoacidosis-New-Improving  Dehydration-improving  Obesity  Hypertension  Hyponatremia  Chest pain, right sided  Abdominal pain  Acute Renal Failure-improving   Decrease IV fluids. Add Glucotrol. Increase Insulin dose. Add potassium. Transfer to telemetry. Increase activity.   LOS: 2 days    Orpah Cobb  MD  02/12/2013, 10:26 AM

## 2013-02-12 NOTE — Progress Notes (Signed)
Physician notified: Algie Coffer At: 68  Regarding: OK to increase diet to carb mod diet? Poss PT order, pt in a lot of pain and unsteady walking to restroom yesterday. Awaiting return response.   Returned Response at: 1100  Order(s): increase diet to carb mod. Order PT eval and treat.

## 2013-02-12 NOTE — Care Management Note (Signed)
    Page 1 of 2   02/14/2013     4:02:57 PM   CARE MANAGEMENT NOTE 02/14/2013  Patient:  [name], [name]   Account Number:  [id number]  Date Initiated:  02/11/2013  Documentation initiated by:  Donn Pierini  Subjective/Objective Assessment:   Pt admitted with DKA-- new     Action/Plan:   PTA pt lived at home-- NCM to follow for d/c needs   Anticipated DC Date:  02/14/2013   Anticipated DC Plan:  HOME W HOME HEALTH SERVICES      DC Planning Services  CM consult      [facility] Choice  HOME HEALTH   Choice offered to / List presented to:  C-1 Patient        HH arranged  HH-1 RN  HH-2 PT      [facility] agency  [facility]   Status of service:  Completed, signed off Medicare Important Message given?   (If response is "NO", the following Medicare IM given date fields will be blank) Date Medicare IM given:   Date Additional Medicare IM given:    Discharge Disposition:  HOME W HOME HEALTH SERVICES  Per UR Regulation:  Reviewed for med. necessity/level of care/duration of stay  If discussed at Long Length of Stay Meetings, dates discussed:    Comments:  02/15/12 Dredyn Gubbels,RN,BSN [phone] PT FOR DC HOME TODAY WITH FRIEND; WILL NEED HH FOLLOW UP. REFERRAL TO [facility], PER PT CHOICE.  START OF CARE 24-48H POST DC DATE.  PT TO DC TO [facility]; ADDRESS IS [address] [place], [place] [address]; PHONE [phone]; CELL [phone].  02/12/13- 1130- Donn Pierini RN, BSN [phone] Referral received for PCP needs- spoke with pt at bedside - per conversation pt states that he has been seeing Dr. Algie Coffer and plans to cont. to see Dr. Algie Coffer- explored the possibility of needing a PCP for pt's DM needs- but pt adamant that he and Dr. Algie Coffer have spoken and have an understanding that pt will continue to see Dr. Algie Coffer for his PCP needs. Gave pt information for [facility] should he need assistance in the future with any PCP needs he also understands he can call his insurance  provider for assistance. Also discussed HH needs- as this time pt open to [facility] if he goes home on insulin- PT eval pending- will reassess HH needs after eval and when decision on insulin made- pt plans to [facility] to [facility] at discharge before returning home- left prescription form for home glucose meter on shadow chart for MD signature. Referral has been made to [facility] DM program. NCM to continue to follow for d/c needs

## 2013-02-12 NOTE — Evaluation (Signed)
Physical Therapy Evaluation Patient Details Name: [name] MRN: [id number] DOB: [birth year] Today's Date: 02/12/2013 Time: 7829-5621 PT Time Calculation (min): 39 min  PT Assessment / Plan / Recommendation History of Present Illness  Pt admitted with R flank pain and SOB. Pt a "new" diabetic.  Clinical Impression  Pt with significant R flank and LE pain limiting pt's ambulation tolerance. Pt requiring assist and RW for safe ambulation at this time. Recommending 24/7 assist upon d/c in which significant other reports "i can be there most of the time." Spoke at length regarding recommendation of a tub bench for safe bathing due to pt unsafe to step over the tub at this time. Recommended pt not to bath until HHPT has been out to show significant other and pt how to safely transfer in/out of the tub using the bench. Pt appears to have decreased understanding of recommendation and medical condition as he reports "as long as she knows whats going on I'm fine." Acute PT to con't to follow to progress mobility.    PT Assessment  Patient needs continued PT services    Follow Up Recommendations  Home health PT;Supervision/Assistance - 24 hour    Does the patient have the potential to tolerate intense rehabilitation      Barriers to Discharge Decreased caregiver support sig other reports she can be there most of the time. questionable if pt can make it up 5 STE without handrail. Highly recommend sig other stay with pt at his apartment since there was an elevator. Sig Other reports her neighbor lifts alot of weights and can help the patient up the 5 steps.    Equipment Recommendations  Rolling walker with 5" wheels (TUB BENCH)    Recommendations for Other Services     Frequency Min 3X/week    Precautions / Restrictions Precautions Precautions: Fall Restrictions Weight Bearing Restrictions: No   Pertinent Vitals/Pain 9/10 R flank pain      Mobility  Bed Mobility Bed Mobility:  Supine to Sit Supine to Sit: 4: Min guard;HOB flat Details for Bed Mobility Assistance: pt used body momentum to swing self up to EOB. onset of R flank pain, attempted to use pillow to provide pressure but didn't assist Transfers Transfers: Sit to Stand;Stand to Sit Sit to Stand: 4: Min assist;With upper extremity assist;From bed Stand to Sit: 4: Min assist;With upper extremity assist;To chair/3-in-1 Details for Transfer Assistance: uncontrolled descent Ambulation/Gait Ambulation/Gait Assistance: 4: Min assist Ambulation Distance (Feet): 50 Feet Assistive device: Rolling walker Ambulation/Gait Assistance Details: pt unsteady with extremely antalgic gait pattern due to R sided pain. attempted ambulation without RW and pt required modA for safety and to steady self. Pt minA with RW however extremely taxing effort due to onset of pain to 10/10. Gait Pattern: Step-to pattern;Decreased step length - right;Decreased stance time - right;Antalgic Gait velocity: slow Stairs: No    Exercises     PT Diagnosis: Acute pain;Difficulty walking  PT Problem List: Decreased strength;Decreased activity tolerance;Decreased balance;Decreased mobility;Pain PT Treatment Interventions: DME instruction;Gait training;Stair training;Functional mobility training;Therapeutic activities;Therapeutic exercise;Balance training     PT Goals(Current goals can be found in the care plan section) Acute Rehab PT Goals Patient Stated Goal: home PT Goal Formulation: With patient Time For Goal Achievement: 02/19/13 Potential to Achieve Goals: Good  Visit Information  Last PT Received On: 02/12/13 Assistance Needed: +1 History of Present Illness: Pt admitted with R flank pain and SOB. Pt a "new" diabetic.       Prior Functioning  Home Living Family/patient expects to be discharged to:: Private residence Living Arrangements: Alone Available Help at Discharge: Family;Friend(s);Available 24 hours/day (significant other  reports she can be avail most of the time) Type of Home: House Home Access: Stairs to enter Entrance Stairs-Number of Steps: 5 Entrance Stairs-Rails: None Home Layout: One level Home Equipment: None Additional Comments: pt lives in an apt with an elevator but plans to go home with sig other who lives in the house Prior Function Level of Independence: Independent Communication Communication: No difficulties Dominant Hand: Right    Cognition  Cognition Arousal/Alertness: Lethargic Behavior During Therapy: Flat affect Overall Cognitive Status:  (pt oriented but lethargic with delayed processing)    Extremity/Trunk Assessment Upper Extremity Assessment Upper Extremity Assessment: RUE deficits/detail RUE Deficits / Details: limted ROM and MMT due to onset of pain, unable to achieve > 45 deg shld flex Lower Extremity Assessment Lower Extremity Assessment: RLE deficits/detail RLE Deficits / Details: generalized weawkness due to onset of pain with MMT Cervical / Trunk Assessment Cervical / Trunk Assessment: Normal   Balance    End of Session PT - End of Session Equipment Utilized During Treatment: Gait belt Activity Tolerance: Patient limited by pain Patient left:  (w/c with RN for transfer to floor) Nurse Communication: Mobility status  GP     Marcene Brawn 02/12/2013, 4:09 PM  Lewis Shock, PT, DPT Pager #: [phone] Office #: [phone]

## 2013-02-12 NOTE — Progress Notes (Signed)
Noted OP order for education to [facility].  Will order. Diabetes coordinator spent much time with this patient yesterday.  Have ordered insulin and diabetes basics education per bedside RN using - teach back method.  RN's to use videos and exit care notes as well. Will see pt today for any other concerns. Thank you, Lenor Coffin, RN, CNS, Diabetes Coordinator [phone])

## 2013-02-12 NOTE — Progress Notes (Signed)
Report called to [place], RN 2W. Pt. Will transfer after PT finished with assessment. Pt. Transferred via wheelchair NT on tele. VSS. eICU and CCMT notified of transfer. All belongings sent with patient and S/O.

## 2013-02-12 NOTE — Progress Notes (Signed)
Pt. And family watching diabetes videos. Pt. Drew up 12 units novolog and gave self injection. Tolerated well.

## 2013-02-13 ENCOUNTER — Inpatient Hospital Stay (HOSPITAL_COMMUNITY): Payer: Medicare HMO

## 2013-02-13 ENCOUNTER — Encounter (HOSPITAL_COMMUNITY): Payer: Self-pay | Admitting: Radiology

## 2013-02-13 LAB — GLUCOSE, CAPILLARY
GLUCOSE-CAPILLARY: 240 mg/dL — AB (ref 70–99)
GLUCOSE-CAPILLARY: 315 mg/dL — AB (ref 70–99)
GLUCOSE-CAPILLARY: 316 mg/dL — AB (ref 70–99)
Glucose-Capillary: 193 mg/dL — ABNORMAL HIGH (ref 70–99)
Glucose-Capillary: 257 mg/dL — ABNORMAL HIGH (ref 70–99)
Glucose-Capillary: 124 mg/dL — ABNORMAL HIGH (ref 70–99)

## 2013-02-13 LAB — BASIC METABOLIC PANEL
BUN: 23 mg/dL (ref 6–23)
CO2: 18 mEq/L — ABNORMAL LOW (ref 19–32)
Calcium: 8.1 mg/dL — ABNORMAL LOW (ref 8.4–10.5)
Chloride: 105 mEq/L (ref 96–112)
Creatinine, Ser: 2.02 mg/dL — ABNORMAL HIGH (ref 0.50–1.35)
GFR calc Af Amer: 38 mL/min — ABNORMAL LOW (ref >90)
GFR calc non Af Amer: 33 mL/min — ABNORMAL LOW (ref >90)
Glucose, Bld: 234 mg/dL — ABNORMAL HIGH (ref 70–99)
Potassium: 3.7 mEq/L (ref 3.7–5.3)
Sodium: 136 mEq/L — ABNORMAL LOW (ref 137–147)

## 2013-02-13 MED ORDER — CALCIUM CARBONATE-VITAMIN D 500-200 MG-UNIT PO TABS
1.0000 | ORAL_TABLET | Freq: Two times a day (BID) | ORAL | Status: DC
  Administered 2013-02-13 – 2013-02-14 (×2): 1 via ORAL
  Filled 2013-02-13 (×5): qty 1

## 2013-02-13 MED ORDER — TRAMADOL HCL 50 MG PO TABS
50.0000 mg | ORAL_TABLET | Freq: Two times a day (BID) | ORAL | Status: DC
  Administered 2013-02-13 – 2013-02-14 (×3): 50 mg via ORAL
  Filled 2013-02-13 (×3): qty 1

## 2013-02-13 MED ORDER — INSULIN GLARGINE 100 UNIT/ML ~~LOC~~ SOLN
30.0000 [IU] | Freq: Every day | SUBCUTANEOUS | Status: DC
  Administered 2013-02-13 – 2013-02-14 (×2): 30 [IU] via SUBCUTANEOUS
  Filled 2013-02-13 (×2): qty 0.3

## 2013-02-13 NOTE — Progress Notes (Signed)
Subjective:  Abdominal and right sided body pain continues. Afebrile.  Objective:  Vital Signs in the last 24 hours: Temp:  [97.5 F (36.4 C)-98.7 F (37.1 C)] 97.5 F (36.4 C) (01/01 0619) Pulse Rate:  [73-87] 87 (01/01 0619) Cardiac Rhythm:  [-] Heart block (01/01 0924) Resp:  [16-22] 16 (01/01 0619) BP: (134-145)/(79-91) 134/86 mmHg (01/01 0619) SpO2:  [98 %-100 %] 100 % (01/01 0619) Weight:  [259 lb 11.2 oz (117.799 kg)] 259 lb 11.2 oz (117.799 kg) (01/01 0615)  Physical Exam: BP Readings from Last 1 Encounters:  02/13/13 134/86     Wt Readings from Last 1 Encounters:  02/13/13 259 lb 11.2 oz (117.799 kg)    Weight change: 2 lb 10.3 oz (1.199 kg)  HEENT: [place]/AT, Eyes-Brown, PERL, EOMI, Conjunctiva-Pink, Sclera-Non-icteric Neck: No JVD, No bruit, Trachea midline. Lungs:  Clear, Bilateral. Cardiac:  Regular rhythm, normal S1 and S2, no S3.  Abdomen:  Soft, RUQ-tender. Extremities:  No edema present. No cyanosis. No clubbing. CNS: AxOx3, Cranial nerves grossly intact, moves all 4 extremities. Right sided weakness persist. Skin: Warm and dry.   Intake/Output from previous day: 12/31 0701 - 01/01 0700 In: 120 [P.O.:120] Out: -     Lab Results: BMET    Component Value Date/Time   NA 136* 02/13/2013 0533   K 3.7 02/13/2013 0533   CL 105 02/13/2013 0533   CO2 18* 02/13/2013 0533   GLUCOSE 234* 02/13/2013 0533   BUN 23 02/13/2013 0533   CREATININE 2.02* 02/13/2013 0533   CALCIUM 8.1* 02/13/2013 0533   GFRNONAA 33* 02/13/2013 0533   GFRAA 38* 02/13/2013 0533   CBC    Component Value Date/Time   WBC 10.5 02/12/2013 0408   RBC 4.05* 02/12/2013 0408   HGB 12.3* 02/12/2013 0408   HCT 34.7* 02/12/2013 0408   PLT 343 02/12/2013 0408   MCV 85.7 02/12/2013 0408   MCH 30.4 02/12/2013 0408   MCHC 35.4 02/12/2013 0408   RDW 12.7 02/12/2013 0408   LYMPHSABS 2.2 04/13/2008 2030   MONOABS 0.8 04/13/2008 2030   EOSABS 0.1 04/13/2008 2030   BASOSABS 0.0 04/13/2008 2030   CARDIAC ENZYMES Lab  Results  Component Value Date   TROPONINI <0.30 02/11/2013    Scheduled Meds: . amLODipine  5 mg Oral QHS   And  . atorvastatin  40 mg Oral QHS  . aspirin EC  81 mg Oral Daily  . calcium-vitamin D  1 tablet Oral BID  . docusate sodium  100 mg Oral BID  . glipiZIDE  5 mg Oral Q breakfast  . heparin  5,000 Units Subcutaneous Q8H  . insulin aspart  0-9 Units Subcutaneous TID WC  . insulin aspart  3 Units Subcutaneous TID WC  . insulin glargine  30 Units Subcutaneous Daily  . multivitamin with minerals  1 tablet Oral Daily  . potassium chloride  10 mEq Oral BID  . propranolol ER  80 mg Oral Daily  . sodium chloride  3 mL Intravenous Q12H  . traMADol  50 mg Oral Q12H   Continuous Infusions: . sodium chloride 50 mL/hr at 02/12/13 1044  . dextrose 5 % and 0.45% NaCl 10 mL/hr at 02/10/13 2259   PRN Meds:.alum & mag hydroxide-simeth, dextrose, ondansetron (ZOFRAN) IV, ondansetron, oxyCODONE  Assessment/Plan: Diabetic ketoacidosis-New-Improving  Dehydration-improving  Obesity  Hypertension-controlled  Hyponatremia-resolving  Chest pain, right sided  Abdominal pain  Acute Renal Failure-improving  CKD, II  Increase Insulin Increase activity. CT abdomen and pelvis. Add Tramadol for pain  control.   LOS: 3 days    Orpah Cobb  MD  02/13/2013, 10:10 AM

## 2013-02-13 NOTE — Progress Notes (Signed)
Physical Therapy Treatment Patient Details Name: George IonJimmy L Edwards MRN: [id number] DOB: 05-16-1950 Today's Date: 02/13/2013 Time: [id number] PT Time Calculation (min): 23 min  PT Assessment / Plan / Recommendation  History of Present Illness Pt admitted with R flank pain and SOB. Pt a "new" diabetic.   PT Comments   Patient seen this AM however unable to work on steps today. Patient continues with unsteady gait and increased 10/10 pain. Do not feel safe with patient going home today. Would benefit from more therapy and to practice steps tomorrow if pain is managable.   Follow Up Recommendations  Home health PT;Supervision/Assistance - 24 hour     Does the patient have the potential to tolerate intense rehabilitation     Barriers to Discharge        Equipment Recommendations  Rolling walker with 5" wheels    Recommendations for Other Services    Frequency Min 3X/week   Progress towards PT Goals Progress towards PT goals: Progressing toward goals  Plan Current plan remains appropriate    Precautions / Restrictions Precautions Precautions: Fall Restrictions Weight Bearing Restrictions: No   Pertinent Vitals/Pain 10/10 side pain with ambulation. RN aware    Mobility  Bed Mobility Supine to Sit: 4: Min guard;HOB flat Details for Bed Mobility Assistance: pt used body momentum to swing self up to EOB. onset of R flank pain Transfers Sit to Stand: 4: Min guard;From chair/3-in-1;From bed Stand to Sit: 4: Min guard;To bed;To chair/3-in-1 Details for Transfer Assistance: uncontrolled descent. Cues for safe hand placement Ambulation/Gait Ambulation/Gait Assistance: 4: Min assist Ambulation Distance (Feet): 50 Feet Assistive device: Rolling walker Ambulation/Gait Assistance Details: Patient continues to be highly unsteady this session with varying gait steps. Patient with onset of flank pain. return to sitting Gait Pattern: Step-to pattern;Decreased step length - right;Decreased  stance time - right;Antalgic    Exercises     PT Diagnosis:    PT Problem List:   PT Treatment Interventions:     PT Goals (current goals can now be found in the care plan section)    Visit Information  Last PT Received On: 02/13/13 Assistance Needed: +1 History of Present Illness: Pt admitted with R flank pain and SOB. Pt a "new" diabetic.    Subjective Data      Cognition  Cognition Arousal/Alertness: Awake/alert Behavior During Therapy: Flat affect    Balance     End of Session PT - End of Session Equipment Utilized During Treatment: Gait belt Activity Tolerance: Patient limited by pain Patient left: in bed;with family/visitor present;with call bell/phone within reach Nurse Communication: Mobility status   GP     Fredrich BirksRobinette, Brittany Amirault Elizabeth 02/13/2013, 10:10 AM 02/13/2013 Fredrich Birksobinette, Haiven Nardone Elizabeth PTA [phone] pager [phone] office

## 2013-02-13 NOTE — Progress Notes (Signed)
Pt drew up AM dose of Novolog and gave self injection with RN.  Pt did well without any issue.  Clovis FredricksonShelton, Blake Goya A

## 2013-02-14 LAB — GLUCOSE, CAPILLARY
GLUCOSE-CAPILLARY: 154 mg/dL — AB (ref 70–99)
GLUCOSE-CAPILLARY: 258 mg/dL — AB (ref 70–99)

## 2013-02-14 MED ORDER — CALCIUM CARBONATE-VITAMIN D 500-200 MG-UNIT PO TABS: 1.0000 | ORAL_TABLET | Freq: Two times a day (BID) | ORAL | Status: DC

## 2013-02-14 MED ORDER — GLIPIZIDE ER 5 MG PO TB24: 5.0000 mg | ORAL_TABLET | Freq: Every day | ORAL | Status: DC

## 2013-02-14 MED ORDER — ASPIRIN 81 MG PO TBEC: 81.0000 mg | DELAYED_RELEASE_TABLET | Freq: Every day | ORAL | Status: DC

## 2013-02-14 MED ORDER — INSULIN GLARGINE 100 UNIT/ML ~~LOC~~ SOLN: 30.0000 [IU] | Freq: Every day | SUBCUTANEOUS | Status: DC

## 2013-02-14 MED ORDER — ADULT MULTIVITAMIN W/MINERALS CH: 1.0000 | ORAL_TABLET | Freq: Every day | ORAL | Status: DC

## 2013-02-14 MED ORDER — TRAMADOL HCL 50 MG PO TABS: 50.0000 mg | ORAL_TABLET | Freq: Two times a day (BID) | ORAL | Status: DC

## 2013-02-14 NOTE — Progress Notes (Signed)
PT Cancellation Note  Patient Details Name: [name] MRN: [id number] DOB: September 11, 1950   Cancelled Treatment:    Reason Eval/Treat Not Completed: Patient declined, no reason specified.  Patient reports he is being discharged and declined PT services today.  Patient does agree to HHPT recommended by PT on eval.  Will be staying at [name]'s home at discharge.  Patient declines RW and tub seat.   Vena AustriaDavis, [place] Kirchner H 02/14/2013, 12:43 PM Durenda HurtSusan H. Renaldo Fiddleravis, PT, [facility] Acute [facility] Services Pager [phone]

## 2013-02-14 NOTE — Discharge Summary (Signed)
Physician Discharge Summary  Patient ID: [name] MRN: [id number] DOB/AGE: [birth year] 65 y.o.  Admit date: 02/10/2013 Discharge date: 02/14/2013  Admission Diagnoses: Diabetic ketoacidosis-New Dehydration  Obesity  Hypertension  Hyponatremia  Chest pain, right sided  Abdominal pain  Acute Renal Failure-improving  CKD, II  Discharge Diagnoses:  Principle Problem: * Diabetic ketoacidosis, type II * Dehydration  Obesity  Hypertension Hyponatremia Chest pain, right sided  Right upper quadrant abdominal pain  Acute Renal Failure  CKD, II Tobacco use disorder Gait abnormality S/P stroke Hepatic steatosis  Discharged Condition: fair  Hospital Course: 63 year old male admitted with 1 week of right sided flank pain and loss of appetite. He was found to be confused and have diabetic ketoacidosis. He has H/O stroke in 2007 with somewhat similar symptoms. He improved with IV fluids and Insulin drip converted to SQ injection in 24-36 hours. He had chronic right sided pain since his stroke. CT of abdomen did not show any acute disease. His symptoms improved with Tramadol use. His activity was increased. He had diabetic education and will have home health RN and PT. He was advised to decrease fat food and walk daily. He will be followed by me in 1 week.   Consults: None  Significant Diagnostic Studies: labs: Near normal CBC but low sodium of 122 and elevated glucose of 558 mg/dl and creatinine of 4.092.68. Blood sugar came down to 150-200 mg range and creatinine came down to 2.02. Sodium increased to 136 meq.  CT abdomen and pelvis: 1. No definite acute findings in the abdomen or pelvis to account for the patient's symptoms.        2. Hepatic steatosis.   CXR-Normal  EKG-Sinus rhythm with non-specific T wave changes.  Echocardiogram: Left ventricle: The cavity size was normal. There was moderate concentric hypertrophy. Systolic function was normal. The estimated ejection fraction  was in the range of 50% to 55%. Wall motion was normal; there were no regional wall motion abnormalities. Doppler parameters are consistent with abnormal left ventricular relaxation (grade 1 diastolic dysfunction).    Treatments: IV hydration, insulin: Lantus, Glucotrol and Tramadol  Discharge Exam: Blood pressure 154/90, pulse 84, temperature 98.5 F (36.9 C), temperature source Oral, resp. rate 18, height 6\' 3"  (1.905 m), weight 263 lb 7.2 oz (119.5 kg), SpO2 98.00%. Physical exam: GENERAL: He is alert, in no acute distress.  HEENT: Brown eyes, pupils equal and reactive to light. Tongue pink and dry.  RESPIRATORY: Clear to auscultation bilaterally. Pain reproducible with inspiration and palpation.  CARDIOVASCULAR: Regular rhythm. S1 and S2 normal.  ABDOMEN: Soft. Mildly tender in right upper quadrant. Positive bowel sounds.   EXTREMITIES: No edema. Good pulses.  NEUROLOGIC: A x O x 2, Mild right sided weakness. Ambulates slowly with some gait disturbance. Psychiatric: Alert but some anxiety and depression.  Disposition:   Discharge Orders   Future Orders Complete By Expires   Ambulatory referral to Nutrition and Diabetic Education  As directed    Comments:     Please contact Ignatius SpeckingMarie Spaulding at [phone] to make appointment.  Patient has some visual problems that may necessitate 1:1 visit instead of group.  Ask to see if Home Health will be seeing patient short-term.  If so, make appointment for after he is no longer being followed.   Ambulatory referral to Nutrition and Diabetic Education  As directed    Comments:     New onset DM 2 History of asthma  HgbA1C of 12.9 % To be  discharged home with girlfriend.   Ambulatory referral to Nutrition and Diabetic Education  As directed    Comments:     New onset type 2, HgbA1C of 12.9% To be dischaged home with girlfriend   Face-to-face encounter (required for Medicare/Medicaid patients)  As directed    Comments:     I [name] S  certify that this patient is under my care and that I, or a nurse practitioner or physician's assistant working with me, had a face-to-face encounter that meets the physician face-to-face encounter requirements with this patient on 02/14/2013. The encounter with the patient was in whole, or in part for the following medical condition(s) which is the primary reason for home health care (List medical condition):  DM, II, Hypertension, H/O stroke, Gait disturbance, Chronic renal disease.   Questions:     The encounter with the patient was in whole, or in part, for the following medical condition, which is the primary reason for home health care:  Diabetic Ketoacidosis   I certify that, based on my findings, the following services are medically necessary home health services:  Nursing   Physical therapy   My clinical findings support the need for the above services:  Unable to leave home safely without assistance and/or assistive device   Further, I certify that my clinical findings support that this patient is homebound due to:  Unsafe ambulation due to balance issues   Reason for Medically Necessary Home Health Services:  Skilled Nursing- Changes in Medication/Medication Management   Home Health  As directed    Questions:     To provide the following care/treatments:  PT   RN       Medication List    STOP taking these medications       valsartan 80 MG tablet  Commonly known as:  DIOVAN      TAKE these medications       amLODipine-atorvastatin 5-40 MG per tablet  Commonly known as:  CADUET  Take 1 tablet by mouth at bedtime.     aspirin 81 MG EC tablet  Take 1 tablet (81 mg total) by mouth daily.     calcium-vitamin D 500-200 MG-UNIT per tablet  Commonly known as:  OSCAL WITH D  Take 1 tablet by mouth 2 (two) times daily.     glipiZIDE 5 MG 24 hr tablet  Commonly known as:  GLUCOTROL XL  Take 1 tablet (5 mg total) by mouth daily with breakfast.     insulin glargine 100 UNIT/ML  injection  Commonly known as:  LANTUS  Inject 0.3 mLs (30 Units total) into the skin daily.     multivitamin with minerals Tabs tablet  Take 1 tablet by mouth daily.     propranolol 80 MG 24 hr capsule  Commonly known as:  INDERAL LA  Take 80 mg by mouth daily.     sildenafil 25 MG tablet  Commonly known as:  VIAGRA  Take 25 mg by mouth daily as needed for erectile dysfunction.     traMADol 50 MG tablet  Commonly known as:  ULTRAM  Take 1 tablet (50 mg total) by mouth every 12 (twelve) hours.         SignedOrpah Cobb S 02/14/2013, 12:47 PM

## 2013-02-14 NOTE — Progress Notes (Signed)
Discharged to home with family office visits in place teaching done Discharged to home with family office visits in place teaching done Discharged to home with family office visits in place teaching done

## 2013-05-08 ENCOUNTER — Encounter: Payer: Medicare HMO | Attending: Cardiovascular Disease | Admitting: *Deleted

## 2013-05-08 ENCOUNTER — Encounter: Payer: Self-pay | Admitting: *Deleted

## 2013-05-08 VITALS — Ht 72.0 in | Wt 270.9 lb

## 2013-05-08 DIAGNOSIS — E119 Type 2 diabetes mellitus without complications: Secondary | ICD-10-CM | POA: Insufficient documentation

## 2013-05-08 DIAGNOSIS — Z713 Dietary counseling and surveillance: Secondary | ICD-10-CM | POA: Insufficient documentation

## 2013-05-08 NOTE — Progress Notes (Signed)
Appt start time: 0900 end time:  1030.   Assessment:  Patient was seen on  05/08/13 for individual diabetes education. He presents with his significant other. In December 2014 he was admitted to hospital in DKA with a random glucose of 558mg /dl. T2DM was a new diagnosis at this time. He had been feeling poorly for a couple months. At the time of discharge he went to stay with his significant other who prepared all his meals for him. Approximately 1 month ago he returned to his home and is taking responsibility for his meals. Chanetta MarshallJimmy does not eat scheduled meals.  Current HbA1c: 12.9%  Preferred Learning Style:   No preference indicated   Learning Readiness:   Change in progress  MEDICATIONS: See list: Lantus, glipizide  DIETARY INTAKE: Get up in the AM @ 5:30AM B (9 AM): [place] bacon biscuit, OJ juice 1C, coffee 1 heaping tsp Snk ( AM): none  L ( 3PM): granola bar Snk ( PM): none D ( 7PM): meat, potatoes, vegetables Snk ( PM): none Beverages: OJ, coffee, water, regular ginger Ale  Usual physical activity: none, [facility]   Intervention:  Nutrition counseling provided.  Discussed diabetes disease process and treatment options.  Discussed physiology of diabetes and role of obesity on insulin resistance.  Encouraged moderate weight reduction to improve glucose levels.  Discussed role of medications and diet in glucose control  Provided education on macronutrients on glucose levels.  Provided education on carb counting, importance of regularly scheduled meals/snacks, and meal planning  Discussed effects of physical activity on glucose levels and long-term glucose control.  Recommended 150 minutes of physical activity/week.  Reviewed patient medications.  Discussed role of medication on blood glucose and possible side effects  Discussed blood glucose monitoring and interpretation.  Discussed recommended target ranges and individual ranges.     Described short-term complications: hyper- and hypo-glycemia.  Discussed causes,symptoms, and treatment options.  Discussed prevention, detection, and treatment of long-term complications.  Discussed the role of prolonged elevated glucose levels on body systems.  Discussed role of stress on blood glucose levels and discussed strategies to manage psychosocial issues.  Discussed recommendations for long-term diabetes self-care.  Established checklist for medical, dental, and emotional self-care.  Plan:  Aim for 3-4 Carb Choices per meal (45-60 grams) +/- 1 either way  Aim for 0-15 Carbs per snack if hungry  Consider reading food labels for Total Carbohydrate and Fat Grams of foods Consider  increasing your activity level by walking for 30 minutes daily as tolerated Consider checking BG at alternate times per day to include first thing in the morning and 2 hours after the first bite of dinner as directed by MD  Continue taking medication  as directed by MD  Whenever you just don't feel right....test and treat appropriately Try to take Insulin within one hour of the day before Consider [facility] [employer]  Teaching Method Utilized:  Visual Auditory Hands on  Handouts given during visit include: Living Well with Diabetes Carb Counting  Meal Plan Card My plate Snacks sheet  Barriers to learning/adherence to lifestyle change: commitment   Diabetes self-care support plan:   [facility] support group  Significant other  Demonstrated degree of understanding via:  Teach Back   Monitoring/Evaluation:  Dietary intake, exercise, test glucose, and body weight. Return visit offered, will f/u prn.

## 2013-05-08 NOTE — Patient Instructions (Signed)
Plan:  Aim for 3-4 Carb Choices per meal (45-60 grams) +/- 1 either way  Aim for 0-15 Carbs per snack if hungry  Consider reading food labels for Total Carbohydrate and Fat Grams of foods Consider  increasing your activity level by walking for 30 minutes daily as tolerated Consider checking BG at alternate times per day to include first thing in the morning and 2 hours after the first bite of dinner as directed by MD  Continue taking medication  as directed by MD  Whenever you just don't feel right....test and treat appropriately Try to take Insulin within one hour of the day before Consider [facility] Protein Bar

## 2014-05-07 DIAGNOSIS — I2510 Atherosclerotic heart disease of native coronary artery without angina pectoris: Secondary | ICD-10-CM | POA: Diagnosis not present

## 2014-05-07 DIAGNOSIS — G8190 Hemiplegia, unspecified affecting unspecified side: Secondary | ICD-10-CM | POA: Diagnosis not present

## 2014-05-07 DIAGNOSIS — I10 Essential (primary) hypertension: Secondary | ICD-10-CM | POA: Diagnosis not present

## 2014-05-07 DIAGNOSIS — E1140 Type 2 diabetes mellitus with diabetic neuropathy, unspecified: Secondary | ICD-10-CM | POA: Diagnosis not present

## 2014-07-23 DIAGNOSIS — E559 Vitamin D deficiency, unspecified: Secondary | ICD-10-CM | POA: Diagnosis not present

## 2014-07-23 DIAGNOSIS — E6609 Other obesity due to excess calories: Secondary | ICD-10-CM | POA: Diagnosis not present

## 2014-07-23 DIAGNOSIS — I69351 Hemiplegia and hemiparesis following cerebral infarction affecting right dominant side: Secondary | ICD-10-CM | POA: Diagnosis not present

## 2014-07-23 DIAGNOSIS — E039 Hypothyroidism, unspecified: Secondary | ICD-10-CM | POA: Diagnosis not present

## 2014-07-23 DIAGNOSIS — I10 Essential (primary) hypertension: Secondary | ICD-10-CM | POA: Diagnosis not present

## 2014-07-23 DIAGNOSIS — D500 Iron deficiency anemia secondary to blood loss (chronic): Secondary | ICD-10-CM | POA: Diagnosis not present

## 2014-08-26 DIAGNOSIS — E119 Type 2 diabetes mellitus without complications: Secondary | ICD-10-CM | POA: Diagnosis not present

## 2014-08-26 DIAGNOSIS — D649 Anemia, unspecified: Secondary | ICD-10-CM | POA: Diagnosis not present

## 2014-08-26 DIAGNOSIS — E876 Hypokalemia: Secondary | ICD-10-CM | POA: Diagnosis not present

## 2014-08-26 DIAGNOSIS — E785 Hyperlipidemia, unspecified: Secondary | ICD-10-CM | POA: Diagnosis not present

## 2014-09-07 DIAGNOSIS — E559 Vitamin D deficiency, unspecified: Secondary | ICD-10-CM | POA: Diagnosis not present

## 2014-09-07 DIAGNOSIS — E039 Hypothyroidism, unspecified: Secondary | ICD-10-CM | POA: Diagnosis not present

## 2014-09-07 DIAGNOSIS — E6609 Other obesity due to excess calories: Secondary | ICD-10-CM | POA: Diagnosis not present

## 2014-09-07 DIAGNOSIS — I10 Essential (primary) hypertension: Secondary | ICD-10-CM | POA: Diagnosis not present

## 2014-09-07 DIAGNOSIS — E1140 Type 2 diabetes mellitus with diabetic neuropathy, unspecified: Secondary | ICD-10-CM | POA: Diagnosis not present

## 2014-09-07 DIAGNOSIS — D500 Iron deficiency anemia secondary to blood loss (chronic): Secondary | ICD-10-CM | POA: Diagnosis not present

## 2014-09-07 DIAGNOSIS — I69351 Hemiplegia and hemiparesis following cerebral infarction affecting right dominant side: Secondary | ICD-10-CM | POA: Diagnosis not present

## 2014-12-03 DIAGNOSIS — I425 Other restrictive cardiomyopathy: Secondary | ICD-10-CM | POA: Diagnosis not present

## 2014-12-03 DIAGNOSIS — I422 Other hypertrophic cardiomyopathy: Secondary | ICD-10-CM | POA: Diagnosis not present

## 2015-02-02 DIAGNOSIS — E119 Type 2 diabetes mellitus without complications: Secondary | ICD-10-CM | POA: Diagnosis not present

## 2015-02-02 DIAGNOSIS — I69351 Hemiplegia and hemiparesis following cerebral infarction affecting right dominant side: Secondary | ICD-10-CM | POA: Diagnosis not present

## 2015-02-02 DIAGNOSIS — E034 Atrophy of thyroid (acquired): Secondary | ICD-10-CM | POA: Diagnosis not present

## 2015-02-02 DIAGNOSIS — E559 Vitamin D deficiency, unspecified: Secondary | ICD-10-CM | POA: Diagnosis not present

## 2015-02-02 DIAGNOSIS — I4589 Other specified conduction disorders: Secondary | ICD-10-CM | POA: Diagnosis not present

## 2015-02-02 DIAGNOSIS — E6609 Other obesity due to excess calories: Secondary | ICD-10-CM | POA: Diagnosis not present

## 2015-02-02 DIAGNOSIS — I10 Essential (primary) hypertension: Secondary | ICD-10-CM | POA: Diagnosis not present

## 2015-02-02 DIAGNOSIS — D500 Iron deficiency anemia secondary to blood loss (chronic): Secondary | ICD-10-CM | POA: Diagnosis not present

## 2015-03-10 DIAGNOSIS — E119 Type 2 diabetes mellitus without complications: Secondary | ICD-10-CM | POA: Diagnosis not present

## 2015-03-10 DIAGNOSIS — N429 Disorder of prostate, unspecified: Secondary | ICD-10-CM | POA: Diagnosis not present

## 2015-03-10 DIAGNOSIS — D649 Anemia, unspecified: Secondary | ICD-10-CM | POA: Diagnosis not present

## 2015-03-10 DIAGNOSIS — E785 Hyperlipidemia, unspecified: Secondary | ICD-10-CM | POA: Diagnosis not present

## 2015-03-10 DIAGNOSIS — Z79899 Other long term (current) drug therapy: Secondary | ICD-10-CM | POA: Diagnosis not present

## 2015-03-10 DIAGNOSIS — E559 Vitamin D deficiency, unspecified: Secondary | ICD-10-CM | POA: Diagnosis not present

## 2015-03-11 DIAGNOSIS — D500 Iron deficiency anemia secondary to blood loss (chronic): Secondary | ICD-10-CM | POA: Diagnosis not present

## 2015-03-11 DIAGNOSIS — E6609 Other obesity due to excess calories: Secondary | ICD-10-CM | POA: Diagnosis not present

## 2015-03-11 DIAGNOSIS — E559 Vitamin D deficiency, unspecified: Secondary | ICD-10-CM | POA: Diagnosis not present

## 2015-03-11 DIAGNOSIS — E119 Type 2 diabetes mellitus without complications: Secondary | ICD-10-CM | POA: Diagnosis not present

## 2015-03-11 DIAGNOSIS — I69351 Hemiplegia and hemiparesis following cerebral infarction affecting right dominant side: Secondary | ICD-10-CM | POA: Diagnosis not present

## 2015-03-11 DIAGNOSIS — E034 Atrophy of thyroid (acquired): Secondary | ICD-10-CM | POA: Diagnosis not present

## 2015-03-11 DIAGNOSIS — I10 Essential (primary) hypertension: Secondary | ICD-10-CM | POA: Diagnosis not present

## 2015-06-01 DIAGNOSIS — E559 Vitamin D deficiency, unspecified: Secondary | ICD-10-CM | POA: Diagnosis not present

## 2015-06-01 DIAGNOSIS — I10 Essential (primary) hypertension: Secondary | ICD-10-CM | POA: Diagnosis not present

## 2015-06-01 DIAGNOSIS — I69351 Hemiplegia and hemiparesis following cerebral infarction affecting right dominant side: Secondary | ICD-10-CM | POA: Diagnosis not present

## 2015-06-01 DIAGNOSIS — E119 Type 2 diabetes mellitus without complications: Secondary | ICD-10-CM | POA: Diagnosis not present

## 2015-06-01 DIAGNOSIS — D500 Iron deficiency anemia secondary to blood loss (chronic): Secondary | ICD-10-CM | POA: Diagnosis not present

## 2015-06-01 DIAGNOSIS — E6609 Other obesity due to excess calories: Secondary | ICD-10-CM | POA: Diagnosis not present

## 2015-06-01 DIAGNOSIS — E034 Atrophy of thyroid (acquired): Secondary | ICD-10-CM | POA: Diagnosis not present

## 2015-08-03 DIAGNOSIS — E559 Vitamin D deficiency, unspecified: Secondary | ICD-10-CM | POA: Diagnosis not present

## 2015-08-03 DIAGNOSIS — E119 Type 2 diabetes mellitus without complications: Secondary | ICD-10-CM | POA: Diagnosis not present

## 2015-08-03 DIAGNOSIS — I2510 Atherosclerotic heart disease of native coronary artery without angina pectoris: Secondary | ICD-10-CM | POA: Diagnosis not present

## 2015-08-03 DIAGNOSIS — E034 Atrophy of thyroid (acquired): Secondary | ICD-10-CM | POA: Diagnosis not present

## 2015-08-03 DIAGNOSIS — I69351 Hemiplegia and hemiparesis following cerebral infarction affecting right dominant side: Secondary | ICD-10-CM | POA: Diagnosis not present

## 2015-08-03 DIAGNOSIS — E663 Overweight: Secondary | ICD-10-CM | POA: Diagnosis not present

## 2015-08-03 DIAGNOSIS — I10 Essential (primary) hypertension: Secondary | ICD-10-CM | POA: Diagnosis not present

## 2015-09-14 DIAGNOSIS — E559 Vitamin D deficiency, unspecified: Secondary | ICD-10-CM | POA: Diagnosis not present

## 2015-09-14 DIAGNOSIS — E876 Hypokalemia: Secondary | ICD-10-CM | POA: Diagnosis not present

## 2015-09-14 DIAGNOSIS — D649 Anemia, unspecified: Secondary | ICD-10-CM | POA: Diagnosis not present

## 2015-09-14 DIAGNOSIS — E0580 Other thyrotoxicosis without thyrotoxic crisis or storm: Secondary | ICD-10-CM | POA: Diagnosis not present

## 2015-09-14 DIAGNOSIS — N429 Disorder of prostate, unspecified: Secondary | ICD-10-CM | POA: Diagnosis not present

## 2015-09-14 DIAGNOSIS — E785 Hyperlipidemia, unspecified: Secondary | ICD-10-CM | POA: Diagnosis not present

## 2015-09-14 DIAGNOSIS — Z79899 Other long term (current) drug therapy: Secondary | ICD-10-CM | POA: Diagnosis not present

## 2015-09-14 DIAGNOSIS — E119 Type 2 diabetes mellitus without complications: Secondary | ICD-10-CM | POA: Diagnosis not present

## 2015-09-15 DIAGNOSIS — E559 Vitamin D deficiency, unspecified: Secondary | ICD-10-CM | POA: Diagnosis not present

## 2015-09-15 DIAGNOSIS — E119 Type 2 diabetes mellitus without complications: Secondary | ICD-10-CM | POA: Diagnosis not present

## 2015-09-15 DIAGNOSIS — I10 Essential (primary) hypertension: Secondary | ICD-10-CM | POA: Diagnosis not present

## 2015-09-15 DIAGNOSIS — J208 Acute bronchitis due to other specified organisms: Secondary | ICD-10-CM | POA: Diagnosis not present

## 2015-09-15 DIAGNOSIS — I69351 Hemiplegia and hemiparesis following cerebral infarction affecting right dominant side: Secondary | ICD-10-CM | POA: Diagnosis not present

## 2015-11-16 DIAGNOSIS — E559 Vitamin D deficiency, unspecified: Secondary | ICD-10-CM | POA: Diagnosis not present

## 2015-11-16 DIAGNOSIS — E119 Type 2 diabetes mellitus without complications: Secondary | ICD-10-CM | POA: Diagnosis not present

## 2015-11-16 DIAGNOSIS — I10 Essential (primary) hypertension: Secondary | ICD-10-CM | POA: Diagnosis not present

## 2015-11-16 DIAGNOSIS — E6609 Other obesity due to excess calories: Secondary | ICD-10-CM | POA: Diagnosis not present

## 2015-11-16 DIAGNOSIS — I69351 Hemiplegia and hemiparesis following cerebral infarction affecting right dominant side: Secondary | ICD-10-CM | POA: Diagnosis not present

## 2015-11-16 DIAGNOSIS — E034 Atrophy of thyroid (acquired): Secondary | ICD-10-CM | POA: Diagnosis not present

## 2015-12-21 DIAGNOSIS — E6609 Other obesity due to excess calories: Secondary | ICD-10-CM | POA: Diagnosis not present

## 2015-12-21 DIAGNOSIS — I69351 Hemiplegia and hemiparesis following cerebral infarction affecting right dominant side: Secondary | ICD-10-CM | POA: Diagnosis not present

## 2015-12-21 DIAGNOSIS — E119 Type 2 diabetes mellitus without complications: Secondary | ICD-10-CM | POA: Diagnosis not present

## 2015-12-21 DIAGNOSIS — E559 Vitamin D deficiency, unspecified: Secondary | ICD-10-CM | POA: Diagnosis not present

## 2015-12-21 DIAGNOSIS — E034 Atrophy of thyroid (acquired): Secondary | ICD-10-CM | POA: Diagnosis not present

## 2015-12-21 DIAGNOSIS — I10 Essential (primary) hypertension: Secondary | ICD-10-CM | POA: Diagnosis not present

## 2015-12-22 ENCOUNTER — Observation Stay: Admission: AD | Admit: 2015-12-22 | Payer: Medicare HMO | Source: Ambulatory Visit | Admitting: Cardiovascular Disease

## 2015-12-27 ENCOUNTER — Telehealth: Payer: Self-pay | Admitting: Family Medicine

## 2015-12-27 NOTE — Telephone Encounter (Signed)
[facility] He would also like to have a reminder phone call for his appt. -Mesha Guinyard

## 2016-03-23 ENCOUNTER — Encounter (HOSPITAL_COMMUNITY): Payer: Self-pay | Admitting: Emergency Medicine

## 2016-03-23 ENCOUNTER — Ambulatory Visit (HOSPITAL_COMMUNITY)
Admission: EM | Admit: 2016-03-23 | Discharge: 2016-03-23 | Disposition: A | Discharge status: Home / Self Care | Payer: Medicare HMO | Attending: Emergency Medicine | Admitting: Emergency Medicine

## 2016-03-23 DIAGNOSIS — R109 Unspecified abdominal pain: Secondary | ICD-10-CM

## 2016-03-23 DIAGNOSIS — R10A0 Flank pain, unspecified side: Secondary | ICD-10-CM

## 2016-03-23 DIAGNOSIS — N200 Calculus of kidney: Secondary | ICD-10-CM | POA: Diagnosis not present

## 2016-03-23 LAB — POCT URINALYSIS DIP (DEVICE)
BILIRUBIN URINE: NEGATIVE
Glucose, UA: NEGATIVE mg/dL
Ketones, ur: NEGATIVE mg/dL
Leukocytes, UA: NEGATIVE
Nitrite: NEGATIVE
PROTEIN: 30 mg/dL — AB
Specific Gravity, Urine: >=1.03 (ref 1.005–1.030)
Urobilinogen, UA: 0.2 mg/dL (ref 0.0–1.0)
pH: 6 (ref 5.0–8.0)

## 2016-03-23 MED ORDER — IBUPROFEN 800 MG PO TABS: 800.0000 mg | ORAL_TABLET | Freq: Three times a day (TID) | ORAL | 0 refills | Status: DC

## 2016-03-23 MED ORDER — TAMSULOSIN HCL 0.4 MG PO CAPS: 0.4000 mg | ORAL_CAPSULE | Freq: Every day | ORAL | 0 refills | Status: DC

## 2016-03-23 NOTE — ED Triage Notes (Signed)
Here for left flank pain that has been constant  Pain increases w/certain movements  LBM = today; normal for pt.   Denies fevers, chills, urinary sx, n/v, diarrhea

## 2016-03-23 NOTE — ED Provider Notes (Signed)
CSN: [id number]     Arrival date & time 03/23/16  1115 History   First MD Initiated Contact with Patient 03/23/16 1205     Chief Complaint  Patient presents with  . Flank Pain   (Consider location/radiation/quality/duration/timing/severity/associated sxs/prior Treatment) 66 year old male presents with 4 day history of lower left flank pain and lower left quadrant abdominal pain. Denies any dysuria, denies frequency, denies urgency, denies visible blood in the urine. He reports he has had normal bowel movements, 1 today, 1 yesterday, no diarrhea, no abdominal cramping, no visible blood in his stool. States he slept on his son's cough 4 days ago and woke up with the pain. It is worse with movement and bending.   The history is provided by the patient.  Flank Pain     Past Medical History:  Diagnosis Date  . Asthma   . Diabetes mellitus without complication (HCC)   . Heartburn   . Hyperlipidemia   . Hypertension   . Obesity   . Stroke [facility](HCC)    Past Surgical History:  Procedure Laterality Date  . KNEE SURGERY  02/2003   Family History  Problem Relation Age of Onset  . Diabetes Sister   . Asthma Other   . Cancer Other   . Hypertension Other   . Stroke Other   . Heart disease Other   . Obesity Other    Social History  Substance Use Topics  . Smoking status: Current Every Day Smoker  . Smokeless tobacco: Never Used  . Alcohol use Yes    Review of Systems  Reason unable to perform ROS: as covered in HPI.  Genitourinary: Positive for flank pain.  All other systems reviewed and are negative.   Allergies  Patient has no known allergies.  Home Medications   Prior to Admission medications   Medication Sig Start Date End Date Taking? Authorizing Provider  amLODipine (NORVASC) 10 MG tablet Take 10 mg by mouth daily.   Yes Historical Provider, MD  amLODipine-atorvastatin (CADUET) 5-40 MG per tablet Take 1 tablet by mouth at bedtime.     Yes Historical Provider, MD  aspirin  EC 81 MG EC tablet Take 1 tablet (81 mg total) by mouth daily. 02/14/13  Yes Orpah CobbAjay Kadakia, MD  calcium-vitamin D (OSCAL WITH D) 500-200 MG-UNIT per tablet Take 1 tablet by mouth 2 (two) times daily. 02/14/13  Yes Orpah CobbAjay Kadakia, MD  glipiZIDE (GLUCOTROL XL) 5 MG 24 hr tablet Take 1 tablet (5 mg total) by mouth daily with breakfast. 02/14/13  Yes Orpah CobbAjay Kadakia, MD  hydrALAZINE (APRESOLINE) 25 MG tablet Take 25 mg by mouth 3 (three) times daily.   Yes Historical Provider, MD  insulin glargine (LANTUS) 100 UNIT/ML injection Inject 0.3 mLs (30 Units total) into the skin daily. 02/14/13  Yes Orpah CobbAjay Kadakia, MD  lisinopril (PRINIVIL,ZESTRIL) 5 MG tablet Take 5 mg by mouth daily.   Yes Historical Provider, MD  metoprolol (LOPRESSOR) 50 MG tablet Take 25 mg by mouth 2 (two) times daily.   Yes Historical Provider, MD  Multiple Vitamin (MULTIVITAMIN WITH MINERALS) TABS tablet Take 1 tablet by mouth daily. 02/14/13  Yes Orpah CobbAjay Kadakia, MD  pravastatin (PRAVACHOL) 40 MG tablet Take 40 mg by mouth daily.   Yes Historical Provider, MD  propranolol (INDERAL LA) 80 MG 24 hr capsule Take 80 mg by mouth daily.     Yes Historical Provider, MD  ibuprofen (ADVIL,MOTRIN) 800 MG tablet Take 1 tablet (800 mg total) by mouth 3 (three) times  daily. 03/23/16   Dorena Bodo, NP  sildenafil (VIAGRA) 25 MG tablet Take 25 mg by mouth daily as needed for erectile dysfunction.     Historical Provider, MD  tamsulosin (FLOMAX) 0.4 MG CAPS capsule Take 1 capsule (0.4 mg total) by mouth daily. 03/23/16   Dorena Bodo, NP  traMADol (ULTRAM) 50 MG tablet Take 1 tablet (50 mg total) by mouth every 12 (twelve) hours. 02/14/13   Orpah Cobb, MD   Meds Ordered and Administered this Visit  Medications - No data to display  BP 136/79 (BP Location: Right Arm)   Pulse 81   Temp 97.9 F (36.6 C) (Oral)   Resp 20   SpO2 99%  No data found.   Physical Exam  Constitutional: He is oriented to person, place, and time. He appears well-developed and  well-nourished. No distress.  HENT:  Head: Normocephalic and atraumatic.  Cardiovascular: Normal rate and regular rhythm.   Pulmonary/Chest: Effort normal and breath sounds normal.  Abdominal: Normal appearance and bowel sounds are normal. There is no hepatosplenomegaly. There is tenderness in the left lower quadrant. There is CVA tenderness (left flank pain). There is no tenderness at McBurney's point and negative Murphy's sign.  Neurological: He is alert and oriented to person, place, and time.  Skin: Skin is warm and dry. Capillary refill takes less than 2 seconds. He is not diaphoretic.  Psychiatric: He has a normal mood and affect.  Nursing note and vitals reviewed.   Urgent Care Course     Procedures (including critical care time)  Labs Review Labs Reviewed  POCT URINALYSIS DIP (DEVICE) - Abnormal; Notable for the following:       Result Value   Hgb urine dipstick SMALL (*)    Protein, ur 30 (*)    All other components within normal limits  URINE CULTURE    Imaging Review No results found.   Visual Acuity Review  Right Eye Distance:   Left Eye Distance:   Bilateral Distance:    Right Eye Near:   Left Eye Near:    Bilateral Near:         MDM   1. Flank pain   2. Nephrolithiasis   I am treating you for a possible kidney stone based on your symptoms. You have blood and protein in your urine. There were not markers for infection, however your urine will be sent for culture to confirm. I have started you on a medicine called Flomax, take 1 tablet a day. I have also given you 800 mg ibuprofen, take 1 tablet every 8 hours as needed for pain. Drink lots of water, avoid soda, tea, and coffee. Should your symptoms last more than 4-5 days, follow up with your primary care provider for further evaluation due to possibility of underlying kidney damage related to a stone.      Dorena Bodo, NP 03/23/16 2042

## 2016-03-23 NOTE — Discharge Instructions (Signed)
I am treating you for a possible kidney stone based on your symptoms. You have blood and protein in your urine. There were not markers for infection, however your urine will be sent for culture to confirm. I have started you on a medicine called Flomax, take 1 tablet a day. I have also given you 800 mg ibuprofen, take 1 tablet every 8 hours as needed for pain. Drink lots of water, avoid soda, tea, and coffee. Should your symptoms last more than 4-5 days, follow up with your primary care provider for further evaluation due to possibility of underlying kidney damage related to a stone.

## 2016-04-20 DIAGNOSIS — E034 Atrophy of thyroid (acquired): Secondary | ICD-10-CM | POA: Diagnosis not present

## 2016-04-20 DIAGNOSIS — I2510 Atherosclerotic heart disease of native coronary artery without angina pectoris: Secondary | ICD-10-CM | POA: Diagnosis not present

## 2016-04-20 DIAGNOSIS — E6609 Other obesity due to excess calories: Secondary | ICD-10-CM | POA: Diagnosis not present

## 2016-04-20 DIAGNOSIS — E119 Type 2 diabetes mellitus without complications: Secondary | ICD-10-CM | POA: Diagnosis not present

## 2016-04-20 DIAGNOSIS — E559 Vitamin D deficiency, unspecified: Secondary | ICD-10-CM | POA: Diagnosis not present

## 2016-04-20 DIAGNOSIS — I69351 Hemiplegia and hemiparesis following cerebral infarction affecting right dominant side: Secondary | ICD-10-CM | POA: Diagnosis not present

## 2016-04-20 DIAGNOSIS — D500 Iron deficiency anemia secondary to blood loss (chronic): Secondary | ICD-10-CM | POA: Diagnosis not present

## 2016-04-20 DIAGNOSIS — I10 Essential (primary) hypertension: Secondary | ICD-10-CM | POA: Diagnosis not present

## 2016-06-08 DIAGNOSIS — I425 Other restrictive cardiomyopathy: Secondary | ICD-10-CM | POA: Diagnosis not present

## 2016-06-08 DIAGNOSIS — I2510 Atherosclerotic heart disease of native coronary artery without angina pectoris: Secondary | ICD-10-CM | POA: Diagnosis not present

## 2016-06-09 ENCOUNTER — Other Ambulatory Visit: Payer: Self-pay | Admitting: Pharmacy Technician

## 2016-06-09 NOTE — Patient Outreach (Signed)
Called patient to discuss Medication Adherence on drugs Glipizide & Pravastatin. Patient stated he has taken it upon himself to take less of his medication. I sent Dr. Algie Coffer informing him of this matter.  Suzan Slick Ernesta Amble [facility] Care Management [phone]

## 2016-07-25 DIAGNOSIS — H25813 Combined forms of age-related cataract, bilateral: Secondary | ICD-10-CM | POA: Diagnosis not present

## 2016-07-25 DIAGNOSIS — E119 Type 2 diabetes mellitus without complications: Secondary | ICD-10-CM | POA: Diagnosis not present

## 2016-07-25 DIAGNOSIS — H43811 Vitreous degeneration, right eye: Secondary | ICD-10-CM | POA: Diagnosis not present

## 2016-08-31 ENCOUNTER — Inpatient Hospital Stay (HOSPITAL_COMMUNITY)
Admission: AD | Admit: 2016-08-31 | Discharge: 2016-09-02 | DRG: 640 | Disposition: A | Discharge status: Home / Self Care | Payer: Medicare HMO | Source: Ambulatory Visit | Attending: Cardiovascular Disease | Admitting: Cardiovascular Disease

## 2016-08-31 DIAGNOSIS — Z794 Long term (current) use of insulin: Secondary | ICD-10-CM

## 2016-08-31 DIAGNOSIS — E6609 Other obesity due to excess calories: Secondary | ICD-10-CM | POA: Diagnosis not present

## 2016-08-31 DIAGNOSIS — F17200 Nicotine dependence, unspecified, uncomplicated: Secondary | ICD-10-CM | POA: Diagnosis present

## 2016-08-31 DIAGNOSIS — Z9114 Patient's other noncompliance with medication regimen: Secondary | ICD-10-CM | POA: Diagnosis not present

## 2016-08-31 DIAGNOSIS — E034 Atrophy of thyroid (acquired): Secondary | ICD-10-CM | POA: Diagnosis not present

## 2016-08-31 DIAGNOSIS — E785 Hyperlipidemia, unspecified: Secondary | ICD-10-CM | POA: Diagnosis present

## 2016-08-31 DIAGNOSIS — E669 Obesity, unspecified: Secondary | ICD-10-CM | POA: Diagnosis present

## 2016-08-31 DIAGNOSIS — Z833 Family history of diabetes mellitus: Secondary | ICD-10-CM

## 2016-08-31 DIAGNOSIS — J45909 Unspecified asthma, uncomplicated: Secondary | ICD-10-CM | POA: Diagnosis present

## 2016-08-31 DIAGNOSIS — E559 Vitamin D deficiency, unspecified: Secondary | ICD-10-CM | POA: Diagnosis not present

## 2016-08-31 DIAGNOSIS — I10 Essential (primary) hypertension: Secondary | ICD-10-CM | POA: Diagnosis present

## 2016-08-31 DIAGNOSIS — Z8249 Family history of ischemic heart disease and other diseases of the circulatory system: Secondary | ICD-10-CM | POA: Diagnosis not present

## 2016-08-31 DIAGNOSIS — Z7982 Long term (current) use of aspirin: Secondary | ICD-10-CM

## 2016-08-31 DIAGNOSIS — Z825 Family history of asthma and other chronic lower respiratory diseases: Secondary | ICD-10-CM

## 2016-08-31 DIAGNOSIS — I69351 Hemiplegia and hemiparesis following cerebral infarction affecting right dominant side: Secondary | ICD-10-CM | POA: Diagnosis not present

## 2016-08-31 DIAGNOSIS — E860 Dehydration: Secondary | ICD-10-CM | POA: Diagnosis not present

## 2016-08-31 DIAGNOSIS — E1110 Type 2 diabetes mellitus with ketoacidosis without coma: Secondary | ICD-10-CM | POA: Diagnosis not present

## 2016-08-31 DIAGNOSIS — Z79899 Other long term (current) drug therapy: Secondary | ICD-10-CM

## 2016-08-31 DIAGNOSIS — Z6831 Body mass index (BMI) 31.0-31.9, adult: Secondary | ICD-10-CM

## 2016-08-31 DIAGNOSIS — Z79891 Long term (current) use of opiate analgesic: Secondary | ICD-10-CM

## 2016-08-31 DIAGNOSIS — E1165 Type 2 diabetes mellitus with hyperglycemia: Secondary | ICD-10-CM | POA: Diagnosis not present

## 2016-08-31 DIAGNOSIS — Z823 Family history of stroke: Secondary | ICD-10-CM

## 2016-08-31 DIAGNOSIS — D500 Iron deficiency anemia secondary to blood loss (chronic): Secondary | ICD-10-CM | POA: Diagnosis not present

## 2016-08-31 LAB — COMPREHENSIVE METABOLIC PANEL
ALK PHOS: 70 U/L (ref 38–126)
ALT: 15 U/L — ABNORMAL LOW (ref 17–63)
ANION GAP: 16 — AB (ref 5–15)
AST: 18 U/L (ref 15–41)
Albumin: 3.8 g/dL (ref 3.5–5.0)
BILIRUBIN TOTAL: 1.6 mg/dL — AB (ref 0.3–1.2)
BUN: 34 mg/dL — ABNORMAL HIGH (ref 6–20)
CALCIUM: 9.3 mg/dL (ref 8.9–10.3)
CO2: 17 mmol/L — AB (ref 22–32)
CREATININE: 2.76 mg/dL — AB (ref 0.61–1.24)
Chloride: 97 mmol/L — ABNORMAL LOW (ref 101–111)
GFR, EST AFRICAN AMERICAN: 26 mL/min — AB (ref >60)
GFR, EST NON AFRICAN AMERICAN: 22 mL/min — AB (ref >60)
Glucose, Bld: 438 mg/dL — ABNORMAL HIGH (ref 65–99)
Potassium: 4.7 mmol/L (ref 3.5–5.1)
SODIUM: 130 mmol/L — AB (ref 135–145)
TOTAL PROTEIN: 7 g/dL (ref 6.5–8.1)

## 2016-08-31 LAB — CBC WITH DIFFERENTIAL/PLATELET
BASOS ABS: 0 10*3/uL (ref 0.0–0.1)
BASOS PCT: 0 %
EOS ABS: 0.1 10*3/uL (ref 0.0–0.7)
EOS PCT: 1 %
HCT: 40.4 % (ref 39.0–52.0)
Hemoglobin: 13.9 g/dL (ref 13.0–17.0)
Lymphocytes Relative: 28 %
Lymphs Abs: 2.7 10*3/uL (ref 0.7–4.0)
MCH: 30.4 pg (ref 26.0–34.0)
MCHC: 34.4 g/dL (ref 30.0–36.0)
MCV: 88.4 fL (ref 78.0–100.0)
MONO ABS: 0.8 10*3/uL (ref 0.1–1.0)
Monocytes Relative: 8 %
Neutro Abs: 6.1 10*3/uL (ref 1.7–7.7)
Neutrophils Relative %: 63 %
PLATELETS: 246 10*3/uL (ref 150–400)
RBC: 4.57 MIL/uL (ref 4.22–5.81)
RDW: 13.1 % (ref 11.5–15.5)
WBC: 9.7 10*3/uL (ref 4.0–10.5)

## 2016-08-31 LAB — TSH: TSH: 0.551 u[IU]/mL (ref 0.350–4.500)

## 2016-08-31 LAB — URINALYSIS, ROUTINE W REFLEX MICROSCOPIC
Bacteria, UA: NONE SEEN
Bilirubin Urine: NEGATIVE
KETONES UR: 80 mg/dL — AB
LEUKOCYTES UA: NEGATIVE
Nitrite: NEGATIVE
PH: 5 (ref 5.0–8.0)
PROTEIN: NEGATIVE mg/dL
RBC / HPF: NONE SEEN RBC/hpf (ref 0–5)
SQUAMOUS EPITHELIAL / LPF: NONE SEEN
Specific Gravity, Urine: 1.026 (ref 1.005–1.030)
WBC, UA: NONE SEEN WBC/hpf (ref 0–5)

## 2016-08-31 LAB — GLUCOSE, CAPILLARY
GLUCOSE-CAPILLARY: 399 mg/dL — AB (ref 65–99)
Glucose-Capillary: 444 mg/dL — ABNORMAL HIGH (ref 65–99)
Glucose-Capillary: 279 mg/dL — ABNORMAL HIGH (ref 65–99)

## 2016-08-31 MED ORDER — ENOXAPARIN SODIUM 40 MG/0.4ML ~~LOC~~ SOLN
40.0000 mg | SUBCUTANEOUS | Status: DC
  Administered 2016-08-31: 18:00:00 40 mg via SUBCUTANEOUS
  Filled 2016-08-31: qty 0.4

## 2016-08-31 MED ORDER — INSULIN GLARGINE 100 UNIT/ML ~~LOC~~ SOLN
20.0000 [IU] | Freq: Every day | SUBCUTANEOUS | Status: DC
  Administered 2016-08-31: 20 [IU] via SUBCUTANEOUS
  Filled 2016-08-31: qty 0.2

## 2016-08-31 MED ORDER — GLIPIZIDE ER 5 MG PO TB24
5.0000 mg | ORAL_TABLET | Freq: Every day | ORAL | Status: DC
  Administered 2016-09-01 – 2016-09-02 (×2): 5 mg via ORAL
  Filled 2016-08-31 (×2): qty 1

## 2016-08-31 MED ORDER — STERILE DILUENT FOR HUMULIN INSULINS: 15.0000 [IU] | Freq: Once | SUBCUTANEOUS | Status: DC

## 2016-08-31 MED ORDER — TAMSULOSIN HCL 0.4 MG PO CAPS
0.4000 mg | ORAL_CAPSULE | Freq: Every day | ORAL | Status: DC
Start: 2016-08-31 — End: 2016-09-02
  Administered 2016-08-31 – 2016-09-02 (×3): 0.4 mg via ORAL
  Filled 2016-08-31 (×2): qty 1

## 2016-08-31 MED ORDER — INSULIN ASPART 100 UNIT/ML ~~LOC~~ SOLN
0.0000 [IU] | Freq: Three times a day (TID) | SUBCUTANEOUS | Status: DC
  Administered 2016-08-31: 15 [IU] via SUBCUTANEOUS
  Administered 2016-09-01 (×2): 11 [IU] via SUBCUTANEOUS
  Administered 2016-09-01: 4 [IU] via SUBCUTANEOUS

## 2016-08-31 MED ORDER — ENOXAPARIN SODIUM 30 MG/0.3ML ~~LOC~~ SOLN
30.0000 mg | SUBCUTANEOUS | Status: DC
  Administered 2016-09-01: 30 mg via SUBCUTANEOUS
  Filled 2016-08-31: qty 0.3

## 2016-08-31 MED ORDER — TRAMADOL HCL 50 MG PO TABS
50.0000 mg | ORAL_TABLET | Freq: Two times a day (BID) | ORAL | Status: DC
  Administered 2016-08-31 – 2016-09-02 (×4): 50 mg via ORAL
  Filled 2016-08-31 (×4): qty 1

## 2016-08-31 MED ORDER — SODIUM CHLORIDE 0.9 % IV SOLN: INTRAVENOUS | Status: DC

## 2016-08-31 MED ORDER — SODIUM CHLORIDE 0.9 % IV SOLN
INTRAVENOUS | Status: DC
  Administered 2016-08-31 – 2016-09-02 (×4): via INTRAVENOUS

## 2016-08-31 MED ORDER — INSULIN ASPART 100 UNIT/ML ~~LOC~~ SOLN
15.0000 [IU] | Freq: Once | SUBCUTANEOUS | Status: AC
  Administered 2016-08-31: 16:00:00 15 [IU] via SUBCUTANEOUS

## 2016-08-31 MED ORDER — ENSURE ENLIVE PO LIQD: 237.0000 mL | Freq: Two times a day (BID) | ORAL | Status: DC

## 2016-08-31 NOTE — H&P (Signed)
Referring Physician:  [name] is an 66 y.o. male.                       Chief Complaint: Weak and elevated blood sugar.  HPI: 66 year old male with PMH of DM, II, Asthma, Hyperlipidemia, Hypertension, Obesity and stroke had right eye cataract surgery scheduled. His blood sugars were elevated and he was sent back to office without surgery. He then revealed that he stopped all of his medications x 6 months due to cost. He feels weak and dry also.   Past Medical History:  Diagnosis Date  . Asthma   . Diabetes mellitus without complication ([place])   . Heartburn   . Hyperlipidemia   . Hypertension   . Obesity   . Stroke [facility])       Past Surgical History:  Procedure Laterality Date  . KNEE SURGERY  02/2003    Family History  Problem Relation Age of Onset  . Diabetes Sister   . Asthma Other   . Cancer Other   . Hypertension Other   . Stroke Other   . Heart disease Other   . Obesity Other    Social History:  reports that he has been smoking.  He has never used smokeless tobacco. He reports that he drinks alcohol. He reports that he does not use drugs.  Allergies: No Known Allergies  Medications Prior to Admission  Medication Sig Dispense Refill  . traMADol (ULTRAM) 50 MG tablet Take 1 tablet (50 mg total) by mouth every 12 (twelve) hours. 60 tablet 1  . aspirin EC 81 MG EC tablet Take 1 tablet (81 mg total) by mouth daily. (Patient not taking: Reported on 08/31/2016)    . calcium-vitamin D (OSCAL WITH D) 500-200 MG-UNIT per tablet Take 1 tablet by mouth 2 (two) times daily. (Patient not taking: Reported on 08/31/2016)    . glipiZIDE (GLUCOTROL XL) 5 MG 24 hr tablet Take 1 tablet (5 mg total) by mouth daily with breakfast. (Patient not taking: Reported on 08/31/2016) 30 tablet 1  . ibuprofen (ADVIL,MOTRIN) 800 MG tablet Take 1 tablet (800 mg total) by mouth 3 (three) times daily. (Patient not taking: Reported on 08/31/2016) 21 tablet 0  . insulin glargine (LANTUS) 100 UNIT/ML  injection Inject 0.3 mLs (30 Units total) into the skin daily. (Patient not taking: Reported on 08/31/2016) 10 mL 11  . Multiple Vitamin (MULTIVITAMIN WITH MINERALS) TABS tablet Take 1 tablet by mouth daily. (Patient not taking: Reported on 08/31/2016)    . tamsulosin (FLOMAX) 0.4 MG CAPS capsule Take 1 capsule (0.4 mg total) by mouth daily. (Patient not taking: Reported on 08/31/2016) 30 capsule 0    Results for orders placed or performed during the hospital encounter of 08/31/16 (from the past 48 hour(s))  Glucose, capillary     Status: Abnormal   Collection Time: 08/31/16  2:19 PM  Result Value Ref Range   Glucose-Capillary 444 (H) 65 - 99 mg/dL  Comprehensive metabolic panel     Status: Abnormal   Collection Time: 08/31/16  4:27 PM  Result Value Ref Range   Sodium 130 (L) 135 - 145 mmol/L   Potassium 4.7 3.5 - 5.1 mmol/L   Chloride 97 (L) 101 - 111 mmol/L   CO2 17 (L) 22 - 32 mmol/L   Glucose, Bld 438 (H) 65 - 99 mg/dL   BUN 34 (H) 6 - 20 mg/dL   Creatinine, Ser 2.76 (H) 0.61 - 1.24 mg/dL  Calcium 9.3 8.9 - 10.3 mg/dL   Total Protein 7.0 6.5 - 8.1 g/dL   Albumin 3.8 3.5 - 5.0 g/dL   AST 18 15 - 41 U/L   ALT 15 (L) 17 - 63 U/L   Alkaline Phosphatase 70 38 - 126 U/L   Total Bilirubin 1.6 (H) 0.3 - 1.2 mg/dL   GFR calc non Af Amer 22 (L) >60 mL/min   GFR calc Af Amer 26 (L) >60 mL/min    Comment: (NOTE) The eGFR has been calculated using the CKD EPI equation. This calculation has not been validated in all clinical situations. eGFR's persistently <60 mL/min signify possible Chronic Kidney Disease.    Anion gap 16 (H) 5 - 15  Glucose, capillary     Status: Abnormal   Collection Time: 08/31/16  4:58 PM  Result Value Ref Range   Glucose-Capillary 399 (H) 65 - 99 mg/dL   No results found.  Review Of Systems Constitutional: Positive fever, chills, weight loss. Eyes: Positive vision change, wears glasses. No discharge or pain. Ears: No hearing loss, No tinnitus. Respiratory: No  asthma, COPD, pneumonias. Positive shortness of breath. No hemoptysis. Cardiovascular: Positive chest pain, palpitation, leg edema. Gastrointestinal: No nausea, vomiting, diarrhea, constipation. No GI bleed. No hepatitis. Genitourinary: No dysuria, hematuria, kidney stone. No incontinance. Neurological: Positive headache, stroke, no seizures.  Psychiatry: No psych facility admission for anxiety, depression, suicide. No detox. Skin: No rash. Musculoskeletal: Positive joint pain, fibromyalgia. No neck pain, back pain. Lymphadenopathy: No lymphadenopathy. Hematology: No anemia or easy bruising.   Blood pressure (!) 166/103, pulse 91, temperature 98.3 F (36.8 C), temperature source Oral, resp. rate 16, height _0  (1.905 m), SpO2 100 %. There is no height or weight on file to calculate BMI. General appearance: alert, cooperative, appears stated age and no distress Head: Normocephalic, atraumatic. Eyes: Brown eyes, pink conjunctiva, corneas clear. PERRL, EOM's intact. Throat-Tongue-dry. Neck: No adenopathy, no carotid bruit, no JVD, supple, symmetrical, trachea midline and thyroid not enlarged. Resp: Clear to auscultation bilaterally. Cardio: Regular rate and rhythm, S1, S2 normal, II/VI systolic murmur, no click, rub or gallop GI: Soft, non-tender; bowel sounds normal; no organomegaly. Extremities: No edema, cyanosis or clubbing. Skin: Warm and dry.  Neurologic: Alert and oriented X 3. Normal coordination and slow gait. Right sided weakness.  Assessment/Plan Dehydration Hyperglycemia Hypertension Hyperlipidemia Type II DM S/P stroke with right sided hemiparesisi.  Admit/IV fluids. Resume insulin and home medications. Check UA.  Birdie Riddle, MD  08/31/2016, 5:30 PM

## 2016-09-01 ENCOUNTER — Encounter (HOSPITAL_COMMUNITY): Payer: Self-pay

## 2016-09-01 LAB — CBC
HCT: 38.6 % — ABNORMAL LOW (ref 39.0–52.0)
Hemoglobin: 12.9 g/dL — ABNORMAL LOW (ref 13.0–17.0)
MCH: 29.3 pg (ref 26.0–34.0)
MCHC: 33.4 g/dL (ref 30.0–36.0)
MCV: 87.5 fL (ref 78.0–100.0)
PLATELETS: 221 10*3/uL (ref 150–400)
RBC: 4.41 MIL/uL (ref 4.22–5.81)
RDW: 12.8 % (ref 11.5–15.5)
WBC: 7.6 10*3/uL (ref 4.0–10.5)

## 2016-09-01 LAB — BASIC METABOLIC PANEL
ANION GAP: 8 (ref 5–15)
BUN: 29 mg/dL — AB (ref 6–20)
CALCIUM: 9 mg/dL (ref 8.9–10.3)
CO2: 22 mmol/L (ref 22–32)
CREATININE: 2.19 mg/dL — AB (ref 0.61–1.24)
Chloride: 106 mmol/L (ref 101–111)
GFR calc Af Amer: 34 mL/min — ABNORMAL LOW (ref >60)
GFR, EST NON AFRICAN AMERICAN: 29 mL/min — AB (ref >60)
GLUCOSE: 177 mg/dL — AB (ref 65–99)
Potassium: 3.9 mmol/L (ref 3.5–5.1)
Sodium: 136 mmol/L (ref 135–145)

## 2016-09-01 LAB — GLUCOSE, CAPILLARY
GLUCOSE-CAPILLARY: 186 mg/dL — AB (ref 65–99)
Glucose-Capillary: 287 mg/dL — ABNORMAL HIGH (ref 65–99)
Glucose-Capillary: 262 mg/dL — ABNORMAL HIGH (ref 65–99)
Glucose-Capillary: 166 mg/dL — ABNORMAL HIGH (ref 65–99)

## 2016-09-01 LAB — HEMOGLOBIN A1C
HEMOGLOBIN A1C: 12.5 % — AB (ref 4.8–5.6)
MEAN PLASMA GLUCOSE: 312 mg/dL

## 2016-09-01 MED ORDER — GLUCERNA SHAKE PO LIQD
237.0000 mL | Freq: Two times a day (BID) | ORAL | Status: DC
  Administered 2016-09-01: 237 mL via ORAL

## 2016-09-01 MED ORDER — INSULIN GLARGINE 100 UNIT/ML ~~LOC~~ SOLN
25.0000 [IU] | Freq: Every day | SUBCUTANEOUS | Status: DC
  Administered 2016-09-01: 25 [IU] via SUBCUTANEOUS
  Filled 2016-09-01: qty 0.25

## 2016-09-01 MED ORDER — INSULIN STARTER KIT- SYRINGES (ENGLISH)
1.0000 | Freq: Once | Status: AC
  Administered 2016-09-01: 1
  Filled 2016-09-01: qty 1

## 2016-09-01 MED ORDER — LIVING WELL WITH DIABETES BOOK
Freq: Once | Status: AC
  Administered 2016-09-01: 11:00:00
  Filled 2016-09-01: qty 1

## 2016-09-01 NOTE — Progress Notes (Signed)
Initial Nutrition Assessment  DOCUMENTATION CODES:   Obesity unspecified  INTERVENTION:   Glucerna Shake po BID, each supplement provides 220 kcal and 10 grams of protein  NUTRITION DIAGNOSIS:   Inadequate oral intake related to poor appetite as evidenced by per patient/family report.  GOAL:   Patient will meet greater than or equal to 90% of their needs  MONITOR:   PO intake, Supplement acceptance, Labs  REASON FOR ASSESSMENT:   Malnutrition Screening Tool   ASSESSMENT:   Pt with PMH of DM, HLD, HTN, and stroke. Presents this admission with hyperglycemia and dehydration.   Pt consuming 100% of meals. Reports a loss of appetite for 7 months related to not taking insulin. States he hasn't taken his insulin since Jan 2018, when prices were increased. Care management consulted to help with medication post discharge.   Reports a UBW of 282 lb, the last time being at that wt in Jan 2018. Records do not reflect any currently wt history, but reported wt loss shows to be 10% in 7 months. This is not significant.   Nutrition-Focused physical exam completed. Findings are no fat depletion, no muscle depletion, and no edema.   Medications reviewed and include: SSI, NS @ 100 ml/hr Labs reviewed: CBG 177-438  Diet Order:  Diet heart healthy/carb modified Room service appropriate? Yes; Fluid consistency: Thin  Skin:  Reviewed, no issues  Last BM:  08/30/16  Height:   Ht Readings from Last 1 Encounters:  08/31/16 6\' 3"  (1.905 m)    Weight:   Wt Readings from Last 1 Encounters:  09/01/16 254 lb (115.2 kg)    Ideal Body Weight:  89 kg  BMI:  Body mass index is 31.75 kg/m.  Estimated Nutritional Needs:   Kcal:  2000-2200 (MST)  Protein:  110-120 grams (1-1.3 g/kg IBW)  Fluid:  >2 L/day  EDUCATION NEEDS:   No education needs identified at this time  Vanessa Kickarly Elisama Thissen RD, LDN Pager # - [phone]

## 2016-09-01 NOTE — Progress Notes (Signed)
Inpatient Diabetes Program Recommendations  AACE/ADA: New Consensus Statement on Inpatient Glycemic Control (2015)  Target Ranges:  Prepandial:   less than 140 mg/dL      Peak postprandial:   less than 180 mg/dL (1-2 hours)      Critically ill patients:  140 - 180 mg/dL   Lab Results  Component Value Date   GLUCAP 186 (H) 09/01/2016   HGBA1C 12.5 (H) 08/31/2016    Review of Glycemic Control Results for [name], [name] (MRN [id number]) as of 09/01/2016 11:00  Ref. Range 08/31/2016 14:19 08/31/2016 16:58 08/31/2016 21:44 09/01/2016 07:39  Glucose-Capillary Latest Ref Range: 65 - 99 mg/dL 444 (H) 399 (H) 279 (H) 186 (H)   Diabetes history: DM2 Outpatient Diabetes medications: Lantus 30 units + Glucotrol 5 Current orders for Inpatient glycemic control: Lantus 20 units   Inpatient Diabetes Program Recommendations:  Spoke with patient and he shared Lantus was too expensive for him to take. Patient has been on insulin and taken by syringe in the past, so states he feels comfortable administering with syringe.  70/30 Novolin insulin available @ [facility] for $25 per vial. Lantus 20 units transitioned to 70/30 = recommend approximately 15 units bid ( approximately 21 units basal + 9 units meal coverage).  Spoke with pt about A1C results 12.5 (average CBG 312 over the past 2-3 months time) with them and explained what an A1C is, basic pathophysiology of DM Type 2, basic home care, basic diabetes diet nutrition principles, importance of checking CBGs and maintaining good CBG control to prevent long-term and short-term complications. Reviewed signs and symptoms of hyperglycemia and hypoglycemia and how to treat hypoglycemia at home. Also reviewed blood sugar goals at home.  RNs to provide ongoing basic DM education at bedside with this patient. Have ordered educational booklet, insulin starter kit, and DM videos.   Thank you, Nani Gasser. Kolyn Rozario, RN, MSN, CDE  Diabetes Coordinator Inpatient Glycemic Control  Team Team Pager [phone] (8am-5pm) 09/01/2016 11:10 AM

## 2016-09-01 NOTE — Progress Notes (Signed)
Ref: George Cobb, MD   Subjective:  Feeling better. Improving blood sugar. Elevated creatinine.  Objective:  Vital Signs in the last 24 hours: Temp:  [97.9 F (36.6 C)-98.6 F (37 C)] 97.9 F (36.6 C) (07/20 0501) Pulse Rate:  [67-91] 67 (07/20 0501) Cardiac Rhythm: Sinus bradycardia (07/20 0700) Resp:  [16-20] 20 (07/20 0501) BP: (161-185)/(90-103) 161/90 (07/20 0501) SpO2:  [97 %-100 %] 97 % (07/20 0501)  Physical Exam: BP Readings from Last 1 Encounters:  09/01/16 (!) 161/90    Wt Readings from Last 1 Encounters:  05/08/13 122.9 kg (270 lb 14.4 oz)    Weight change:  There is no height or weight on file to calculate BMI. HEENT: [place]/AT, Eyes-Brown, PERL, EOMI, Conjunctiva-Pink, Sclera-Non-icteric Neck: No JVD, No bruit, Trachea midline. Lungs:  Clear, Bilateral. Cardiac:  Regular rhythm, normal S1 and S2, no S3. II/VI systolic murmur. Abdomen:  Soft, non-tender. BS present. Extremities:  No edema present. No cyanosis. No clubbing. CNS: AxOx3, Cranial nerves grossly intact, moves all 4 extremities.  Skin: Warm and dry.   Intake/Output from previous day: 07/19 0701 - 07/20 0700 In: 1413.3 [I.V.:1413.3] Out: 240 [Urine:240]    Lab Results: BMET    Component Value Date/Time   NA 136 09/01/2016 0438   NA 130 (L) 08/31/2016 1627   NA 136 (L) 02/13/2013 0533   K 3.9 09/01/2016 0438   K 4.7 08/31/2016 1627   K 3.7 02/13/2013 0533   CL 106 09/01/2016 0438   CL 97 (L) 08/31/2016 1627   CL 105 02/13/2013 0533   CO2 22 09/01/2016 0438   CO2 17 (L) 08/31/2016 1627   CO2 18 (L) 02/13/2013 0533   GLUCOSE 177 (H) 09/01/2016 0438   GLUCOSE 438 (H) 08/31/2016 1627   GLUCOSE 234 (H) 02/13/2013 0533   BUN 29 (H) 09/01/2016 0438   BUN 34 (H) 08/31/2016 1627   BUN 23 02/13/2013 0533   CREATININE 2.19 (H) 09/01/2016 0438   CREATININE 2.76 (H) 08/31/2016 1627   CREATININE 2.02 (H) 02/13/2013 0533   CALCIUM 9.0 09/01/2016 0438   CALCIUM 9.3 08/31/2016 1627   CALCIUM 8.1  (L) 02/13/2013 0533   GFRNONAA 29 (L) 09/01/2016 0438   GFRNONAA 22 (L) 08/31/2016 1627   GFRNONAA 33 (L) 02/13/2013 0533   GFRAA 34 (L) 09/01/2016 0438   GFRAA 26 (L) 08/31/2016 1627   GFRAA 38 (L) 02/13/2013 0533   CBC    Component Value Date/Time   WBC 7.6 09/01/2016 0438   RBC 4.41 09/01/2016 0438   HGB 12.9 (L) 09/01/2016 0438   HCT 38.6 (L) 09/01/2016 0438   PLT 221 09/01/2016 0438   MCV 87.5 09/01/2016 0438   MCH 29.3 09/01/2016 0438   MCHC 33.4 09/01/2016 0438   RDW 12.8 09/01/2016 0438   LYMPHSABS 2.7 08/31/2016 1843   MONOABS 0.8 08/31/2016 1843   EOSABS 0.1 08/31/2016 1843   BASOSABS 0.0 08/31/2016 1843   HEPATIC Function Panel  Recent Labs  08/31/16 1627  PROT 7.0   HEMOGLOBIN A1C No components found for: HGA1C,  MPG CARDIAC ENZYMES Lab Results  Component Value Date   TROPONINI <0.30 02/11/2013   TROPONINI <0.30 02/11/2013   TROPONINI <0.30 02/10/2013   BNP No results for input(s): PROBNP in the last 8760 hours. TSH  Recent Labs  08/31/16 1627  TSH 0.551   CHOLESTEROL No results for input(s): CHOL in the last 8760 hours.  Scheduled Meds: . enoxaparin (LOVENOX) injection  30 mg Subcutaneous Q24H  . feeding supplement (  ENSURE ENLIVE)  237 mL Oral BID BM  . glipiZIDE  5 mg Oral Q breakfast  . insulin aspart  0-20 Units Subcutaneous TID WC  . insulin glargine  20 Units Subcutaneous QHS  . tamsulosin  0.4 mg Oral Daily  . traMADol  50 mg Oral Q12H   Continuous Infusions: . sodium chloride 100 mL/hr at 09/01/16 0214   PRN Meds:.  Assessment/Plan: Dehydration-improving Hyperglycemia in DM, II Hypertension Hyperlipidemia S/P stroke with right sided hemiparesis  Continue hydration. Adjust insulin dose. Increase activity.   LOS: 1 day    George CobbAjay Mayson Mcneish  MD  09/01/2016, 9:36 AM

## 2016-09-02 LAB — BASIC METABOLIC PANEL
ANION GAP: 7 (ref 5–15)
BUN: 22 mg/dL — AB (ref 6–20)
CALCIUM: 8.6 mg/dL — AB (ref 8.9–10.3)
CO2: 22 mmol/L (ref 22–32)
Chloride: 109 mmol/L (ref 101–111)
Creatinine, Ser: 1.82 mg/dL — ABNORMAL HIGH (ref 0.61–1.24)
GFR calc Af Amer: 42 mL/min — ABNORMAL LOW (ref >60)
GFR, EST NON AFRICAN AMERICAN: 36 mL/min — AB (ref >60)
GLUCOSE: 145 mg/dL — AB (ref 65–99)
POTASSIUM: 3.5 mmol/L (ref 3.5–5.1)
SODIUM: 138 mmol/L (ref 135–145)

## 2016-09-02 LAB — LIPID PANEL
CHOL/HDL RATIO: 4.9 ratio
CHOLESTEROL: 177 mg/dL (ref 0–200)
HDL: 36 mg/dL — ABNORMAL LOW (ref >40)
LDL Cholesterol: 119 mg/dL — ABNORMAL HIGH (ref 0–99)
Triglycerides: 109 mg/dL (ref <150)
VLDL: 22 mg/dL (ref 0–40)

## 2016-09-02 LAB — GLUCOSE, CAPILLARY: GLUCOSE-CAPILLARY: 116 mg/dL — AB (ref 65–99)

## 2016-09-02 MED ORDER — ATORVASTATIN CALCIUM 40 MG PO TABS: 40.0000 mg | ORAL_TABLET | Freq: Every day | ORAL | 3 refills | Status: DC

## 2016-09-02 MED ORDER — INSULIN GLARGINE 100 UNIT/ML ~~LOC~~ SOLN
25.0000 [IU] | Freq: Every day | SUBCUTANEOUS | 11 refills | Status: DC
Start: 2016-09-02 — End: 2019-01-03

## 2016-09-02 MED ORDER — ATORVASTATIN CALCIUM 40 MG PO TABS: 40.0000 mg | ORAL_TABLET | Freq: Every day | ORAL | Status: DC

## 2016-09-02 MED ORDER — TAMSULOSIN HCL 0.4 MG PO CAPS: 0.4000 mg | ORAL_CAPSULE | Freq: Every day | ORAL | 6 refills | Status: DC

## 2016-09-02 NOTE — Progress Notes (Signed)
[name] to be D/C'd to home per MD order.  Discussed with the patient and all questions fully answered.  VSS, Skin clean, dry and intact without evidence of skin break down, no evidence of skin tears noted. IV catheter discontinued intact. Site without signs and symptoms of complications. Dressing and pressure applied.  An After Visit Summary was printed and given to the patient. Patient received prescriptions.  D/c education completed with patient/family including follow up instructions, medication list, d/c activities limitations if indicated, with other d/c instructions as indicated by MD - patient able to verbalize understanding, all questions fully answered.   Patient instructed to return to ED, call 911, or call MD for any changes in condition.   Patient escorted via WC, and D/C home via private auto.  [name] 09/02/2016 11:09 AM

## 2016-09-02 NOTE — Care Management Note (Signed)
Case Management Note  Patient Details  Name: [name] MRN: [id number] Date of Birth: 11/25/1950  Subjective/Objective: Pt presented for Dehydration and Type 2 uncontrolled Diabetes. Pt has not taken Lantus since Jan. Co pay increased from $45.00 to $250.00. Unsure if pt has not met deductible. Pt aware to call the Insurance Co to check reasoning behind increased cost. Pt is from home and has support of family. Plan for d/c home 09-02-16.                    Action/Plan: CM did speak with pt and MD in regards to disposition needs of medications. Pt agreeable to change to 70/30 Novolin and MD to write Rx. CM did provide information to family in regards to patient assistance application on the [employer]. NO further needs from CM at this time.   Expected Discharge Date:  09/02/16               Expected Discharge Plan:  Home/Self Care  In-House Referral:  NA  Discharge planning Services  CM Consult, Medication Assistance  Post Acute Care Choice:  NA Choice offered to:  NA  DME Arranged:  N/A DME Agency:  NA  HH Arranged:  NA HH Agency:  NA  Status of Service:  Completed, signed off  If discussed at [place] of Stay Meetings, dates discussed:    Additional Comments:  Bethena Roys, RN 09/02/2016, 10:47 AM

## 2016-09-02 NOTE — Discharge Summary (Signed)
Physician Discharge Summary  Patient ID: [name] MRN: [id number] DOB/AGE: August 14, 1950 66 y.o.  Admit date: 08/31/2016 Discharge date: 09/02/2016  Admission Diagnoses: Dehydration Type 2 diabetes mellitus uncontrolled Hypertension Hyperlipidemia Status post stroke with right-sided hemiparesis  Discharge Diagnoses:  Principal Problem:   Dehydration Active Problems:   HYPERTENSION, BENIGN ESSENTIAL   Diabetic ketoacidosis, type II (HCC)   Hyperlipidemia   Status post stroke with right-sided hemiparesis   Obesity  Discharged Condition: fair  Hospital Course: 66 year old male with PMH of diabetes mellitus type 2, asthma, hyperlipidemia, hypertension, obesity and stroke with right sided weakness had the right eye cataract surgery cancelled due to markedly elevated blood sugars. He was admitted for dehydration. Patient had discontinued all his medications for 6 months due to cost. His condition improved with IV hydration and resuming Lantus insulin. His medication regimen was simplified and he was discharged home in stable condition with follow up by me in 1 week.  Consults: cardiology  Significant Diagnostic Studies: labs: CBC unremarkable, Elevated creatinine of 2.76. on admission and on day of discharge it was down to 1.82.  Markedly elevated glucose of 438 mg. was down to 145 mg. TSH was normal, Hgb A1c was 12.5.  UA showed 80 mg. of ketones. LDL cholesterol of 119 and HDL cholesterol was 36 mg. With total cholesterol of 177 mg.  Treatments: IV hydration and insulin: Lantus.  Discharge Exam: Blood pressure (!) 167/73, pulse 66, temperature 97.8 F (36.6 C), temperature source Oral, resp. rate 18, height 6\' 3"  (1.905 m), weight 115.2 kg (254 lb), SpO2 100 %. General appearance: alert, cooperative and appears stated age. Head: Normocephalic, atraumatic. Eyes: Brown eyes, pink conjunctiva, corneas clear. PERRL, EOM's intact.  Neck: No adenopathy, no carotid bruit, no JVD,  supple, symmetrical, trachea midline and thyroid not enlarged. Resp: Clear to auscultation bilaterally. Cardio: Regular rate and rhythm, S1, S2 normal, II/VI systolic murmur, no click, rub or gallop. GI: Soft, non-tender; bowel sounds normal; no organomegaly. Extremities: No edema, cyanosis or clubbing. Skin: Warm and dry.  Neurologic: Alert and oriented X 3, normal strength and tone. Normal coordination and slow gait with right sided weakness.  Disposition: 01-Home or Self Care   Allergies as of 09/02/2016   No Known Allergies     Medication List    STOP taking these medications   glipiZIDE 5 MG 24 hr tablet Commonly known as:  GLUCOTROL XL   ibuprofen 800 MG tablet Commonly known as:  ADVIL,MOTRIN   traMADol 50 MG tablet Commonly known as:  ULTRAM     TAKE these medications   aspirin 81 MG EC tablet Take 1 tablet (81 mg total) by mouth daily.   atorvastatin 40 MG tablet Commonly known as:  LIPITOR Take 1 tablet (40 mg total) by mouth daily at 6 PM.   calcium-vitamin D 500-200 MG-UNIT tablet Commonly known as:  OSCAL WITH D Take 1 tablet by mouth 2 (two) times daily.   insulin glargine 100 UNIT/ML injection Commonly known as:  LANTUS Inject 0.25 mLs (25 Units total) into the skin at bedtime. What changed:  how much to take  when to take this   multivitamin with minerals Tabs tablet Take 1 tablet by mouth daily.   tamsulosin 0.4 MG Caps capsule Commonly known as:  FLOMAX Take 1 capsule (0.4 mg total) by mouth daily after supper. What changed:  when to take this      Follow-up Information    Orpah CobbKadakia, Khup Sapia, MD. Schedule an appointment  as soon as possible for a visit in 1 week(s).   Specialty:  Cardiology Contact information: [address] [place] [address] [phone]           Signed: Ricki Rodriguez 09/02/2016, 10:14 AM

## 2016-09-04 NOTE — Consult Note (Signed)
           [facility] CM Primary Care Navigator  09/04/2016  [name] 09/30/1950 [id number]   Went to see to see patient at the bedsideto identify possible discharge needs but he was alreadydischarged.  Patient was discharged home over the weekend per staff report .  Provider's office called (Annarou)and confirmed if patient is being seen by Dr. Algie CofferKadakia as his primary care provider and to notify of patient's discharge and needfor post hospital follow-up and transition of care. Reminded as well of patient's health issues (particularly DM- A1C 12.5; medication assistance and management) needing follow-up.  Annarou states she will make Dr. Algie CofferKadakia aware of these issues.  Made aware to [facility] to [facility] care management ifdeemed appropriateand necessary for further services.  For questions, please contact:  Wyatt HasteLorraine Acheron Sugg, BSN, RN- [facility] Primary Care Navigator  Telephone: [phone] [facility]

## 2016-09-07 DIAGNOSIS — E119 Type 2 diabetes mellitus without complications: Secondary | ICD-10-CM | POA: Diagnosis not present

## 2016-09-07 DIAGNOSIS — E559 Vitamin D deficiency, unspecified: Secondary | ICD-10-CM | POA: Diagnosis not present

## 2016-09-07 DIAGNOSIS — E6609 Other obesity due to excess calories: Secondary | ICD-10-CM | POA: Diagnosis not present

## 2016-09-07 DIAGNOSIS — I69351 Hemiplegia and hemiparesis following cerebral infarction affecting right dominant side: Secondary | ICD-10-CM | POA: Diagnosis not present

## 2016-09-07 DIAGNOSIS — I10 Essential (primary) hypertension: Secondary | ICD-10-CM | POA: Diagnosis not present

## 2016-09-07 DIAGNOSIS — D500 Iron deficiency anemia secondary to blood loss (chronic): Secondary | ICD-10-CM | POA: Diagnosis not present

## 2016-09-07 DIAGNOSIS — E034 Atrophy of thyroid (acquired): Secondary | ICD-10-CM | POA: Diagnosis not present

## 2016-09-12 DIAGNOSIS — D500 Iron deficiency anemia secondary to blood loss (chronic): Secondary | ICD-10-CM | POA: Diagnosis not present

## 2016-09-12 DIAGNOSIS — I10 Essential (primary) hypertension: Secondary | ICD-10-CM | POA: Diagnosis not present

## 2016-09-12 DIAGNOSIS — E119 Type 2 diabetes mellitus without complications: Secondary | ICD-10-CM | POA: Diagnosis not present

## 2016-09-12 DIAGNOSIS — I69351 Hemiplegia and hemiparesis following cerebral infarction affecting right dominant side: Secondary | ICD-10-CM | POA: Diagnosis not present

## 2016-09-12 DIAGNOSIS — E6609 Other obesity due to excess calories: Secondary | ICD-10-CM | POA: Diagnosis not present

## 2016-09-12 DIAGNOSIS — E559 Vitamin D deficiency, unspecified: Secondary | ICD-10-CM | POA: Diagnosis not present

## 2016-09-12 DIAGNOSIS — E034 Atrophy of thyroid (acquired): Secondary | ICD-10-CM | POA: Diagnosis not present

## 2016-09-20 DIAGNOSIS — E034 Atrophy of thyroid (acquired): Secondary | ICD-10-CM | POA: Diagnosis not present

## 2016-09-20 DIAGNOSIS — E559 Vitamin D deficiency, unspecified: Secondary | ICD-10-CM | POA: Diagnosis not present

## 2016-09-20 DIAGNOSIS — E119 Type 2 diabetes mellitus without complications: Secondary | ICD-10-CM | POA: Diagnosis not present

## 2016-09-20 DIAGNOSIS — D500 Iron deficiency anemia secondary to blood loss (chronic): Secondary | ICD-10-CM | POA: Diagnosis not present

## 2016-09-20 DIAGNOSIS — I10 Essential (primary) hypertension: Secondary | ICD-10-CM | POA: Diagnosis not present

## 2016-09-20 DIAGNOSIS — E6609 Other obesity due to excess calories: Secondary | ICD-10-CM | POA: Diagnosis not present

## 2016-09-20 DIAGNOSIS — I69351 Hemiplegia and hemiparesis following cerebral infarction affecting right dominant side: Secondary | ICD-10-CM | POA: Diagnosis not present

## 2016-09-27 DIAGNOSIS — I69351 Hemiplegia and hemiparesis following cerebral infarction affecting right dominant side: Secondary | ICD-10-CM | POA: Diagnosis not present

## 2016-09-27 DIAGNOSIS — E6609 Other obesity due to excess calories: Secondary | ICD-10-CM | POA: Diagnosis not present

## 2016-09-27 DIAGNOSIS — E559 Vitamin D deficiency, unspecified: Secondary | ICD-10-CM | POA: Diagnosis not present

## 2016-09-27 DIAGNOSIS — D500 Iron deficiency anemia secondary to blood loss (chronic): Secondary | ICD-10-CM | POA: Diagnosis not present

## 2016-09-27 DIAGNOSIS — I10 Essential (primary) hypertension: Secondary | ICD-10-CM | POA: Diagnosis not present

## 2016-09-27 DIAGNOSIS — E119 Type 2 diabetes mellitus without complications: Secondary | ICD-10-CM | POA: Diagnosis not present

## 2016-09-27 DIAGNOSIS — E034 Atrophy of thyroid (acquired): Secondary | ICD-10-CM | POA: Diagnosis not present

## 2016-10-31 DIAGNOSIS — I10 Essential (primary) hypertension: Secondary | ICD-10-CM | POA: Diagnosis not present

## 2016-10-31 DIAGNOSIS — E6609 Other obesity due to excess calories: Secondary | ICD-10-CM | POA: Diagnosis not present

## 2016-10-31 DIAGNOSIS — I69351 Hemiplegia and hemiparesis following cerebral infarction affecting right dominant side: Secondary | ICD-10-CM | POA: Diagnosis not present

## 2016-10-31 DIAGNOSIS — E034 Atrophy of thyroid (acquired): Secondary | ICD-10-CM | POA: Diagnosis not present

## 2016-10-31 DIAGNOSIS — E119 Type 2 diabetes mellitus without complications: Secondary | ICD-10-CM | POA: Diagnosis not present

## 2016-10-31 DIAGNOSIS — E559 Vitamin D deficiency, unspecified: Secondary | ICD-10-CM | POA: Diagnosis not present

## 2016-10-31 DIAGNOSIS — D500 Iron deficiency anemia secondary to blood loss (chronic): Secondary | ICD-10-CM | POA: Diagnosis not present

## 2016-11-07 DIAGNOSIS — E119 Type 2 diabetes mellitus without complications: Secondary | ICD-10-CM | POA: Diagnosis not present

## 2016-11-07 DIAGNOSIS — H25813 Combined forms of age-related cataract, bilateral: Secondary | ICD-10-CM | POA: Diagnosis not present

## 2016-11-07 DIAGNOSIS — H43811 Vitreous degeneration, right eye: Secondary | ICD-10-CM | POA: Diagnosis not present

## 2017-01-30 DIAGNOSIS — E6609 Other obesity due to excess calories: Secondary | ICD-10-CM | POA: Diagnosis not present

## 2017-01-30 DIAGNOSIS — E034 Atrophy of thyroid (acquired): Secondary | ICD-10-CM | POA: Diagnosis not present

## 2017-01-30 DIAGNOSIS — I10 Essential (primary) hypertension: Secondary | ICD-10-CM | POA: Diagnosis not present

## 2017-01-30 DIAGNOSIS — E559 Vitamin D deficiency, unspecified: Secondary | ICD-10-CM | POA: Diagnosis not present

## 2017-01-30 DIAGNOSIS — E119 Type 2 diabetes mellitus without complications: Secondary | ICD-10-CM | POA: Diagnosis not present

## 2017-01-30 DIAGNOSIS — I69351 Hemiplegia and hemiparesis following cerebral infarction affecting right dominant side: Secondary | ICD-10-CM | POA: Diagnosis not present

## 2017-01-30 DIAGNOSIS — D500 Iron deficiency anemia secondary to blood loss (chronic): Secondary | ICD-10-CM | POA: Diagnosis not present

## 2017-02-01 DIAGNOSIS — H268 Other specified cataract: Secondary | ICD-10-CM | POA: Diagnosis not present

## 2017-02-01 DIAGNOSIS — H2511 Age-related nuclear cataract, right eye: Secondary | ICD-10-CM | POA: Diagnosis not present

## 2017-10-04 DIAGNOSIS — E119 Type 2 diabetes mellitus without complications: Secondary | ICD-10-CM | POA: Diagnosis not present

## 2017-10-04 DIAGNOSIS — I69351 Hemiplegia and hemiparesis following cerebral infarction affecting right dominant side: Secondary | ICD-10-CM | POA: Diagnosis not present

## 2017-10-04 DIAGNOSIS — E559 Vitamin D deficiency, unspecified: Secondary | ICD-10-CM | POA: Diagnosis not present

## 2017-10-04 DIAGNOSIS — I10 Essential (primary) hypertension: Secondary | ICD-10-CM | POA: Diagnosis not present

## 2017-10-04 DIAGNOSIS — E034 Atrophy of thyroid (acquired): Secondary | ICD-10-CM | POA: Diagnosis not present

## 2017-10-04 DIAGNOSIS — D500 Iron deficiency anemia secondary to blood loss (chronic): Secondary | ICD-10-CM | POA: Diagnosis not present

## 2017-10-04 DIAGNOSIS — E6609 Other obesity due to excess calories: Secondary | ICD-10-CM | POA: Diagnosis not present

## 2017-10-22 DIAGNOSIS — E119 Type 2 diabetes mellitus without complications: Secondary | ICD-10-CM | POA: Diagnosis not present

## 2017-10-22 DIAGNOSIS — Z961 Presence of intraocular lens: Secondary | ICD-10-CM | POA: Diagnosis not present

## 2017-10-22 DIAGNOSIS — H25812 Combined forms of age-related cataract, left eye: Secondary | ICD-10-CM | POA: Diagnosis not present

## 2017-10-22 DIAGNOSIS — H43811 Vitreous degeneration, right eye: Secondary | ICD-10-CM | POA: Diagnosis not present

## 2017-12-24 DIAGNOSIS — H2512 Age-related nuclear cataract, left eye: Secondary | ICD-10-CM | POA: Diagnosis not present

## 2017-12-27 DIAGNOSIS — H2512 Age-related nuclear cataract, left eye: Secondary | ICD-10-CM | POA: Diagnosis not present

## 2017-12-27 DIAGNOSIS — H268 Other specified cataract: Secondary | ICD-10-CM | POA: Diagnosis not present

## 2017-12-31 DIAGNOSIS — Z79899 Other long term (current) drug therapy: Secondary | ICD-10-CM | POA: Diagnosis not present

## 2017-12-31 DIAGNOSIS — N429 Disorder of prostate, unspecified: Secondary | ICD-10-CM | POA: Diagnosis not present

## 2017-12-31 DIAGNOSIS — E559 Vitamin D deficiency, unspecified: Secondary | ICD-10-CM | POA: Diagnosis not present

## 2017-12-31 DIAGNOSIS — E876 Hypokalemia: Secondary | ICD-10-CM | POA: Diagnosis not present

## 2017-12-31 DIAGNOSIS — E1165 Type 2 diabetes mellitus with hyperglycemia: Secondary | ICD-10-CM | POA: Diagnosis not present

## 2017-12-31 DIAGNOSIS — E785 Hyperlipidemia, unspecified: Secondary | ICD-10-CM | POA: Diagnosis not present

## 2017-12-31 DIAGNOSIS — D649 Anemia, unspecified: Secondary | ICD-10-CM | POA: Diagnosis not present

## 2018-01-01 DIAGNOSIS — E1165 Type 2 diabetes mellitus with hyperglycemia: Secondary | ICD-10-CM | POA: Diagnosis not present

## 2018-01-01 DIAGNOSIS — E119 Type 2 diabetes mellitus without complications: Secondary | ICD-10-CM | POA: Diagnosis not present

## 2018-01-01 DIAGNOSIS — E559 Vitamin D deficiency, unspecified: Secondary | ICD-10-CM | POA: Diagnosis not present

## 2018-01-01 DIAGNOSIS — E034 Atrophy of thyroid (acquired): Secondary | ICD-10-CM | POA: Diagnosis not present

## 2018-01-01 DIAGNOSIS — I10 Essential (primary) hypertension: Secondary | ICD-10-CM | POA: Diagnosis not present

## 2018-01-01 DIAGNOSIS — I69351 Hemiplegia and hemiparesis following cerebral infarction affecting right dominant side: Secondary | ICD-10-CM | POA: Diagnosis not present

## 2018-01-23 ENCOUNTER — Encounter (HOSPITAL_COMMUNITY): Payer: Self-pay

## 2018-01-23 ENCOUNTER — Emergency Department (HOSPITAL_COMMUNITY)
Admission: EM | Admit: 2018-01-23 | Discharge: 2018-01-23 | Disposition: A | Discharge status: Home / Self Care | Payer: Medicare HMO | Attending: Emergency Medicine | Admitting: Emergency Medicine

## 2018-01-23 ENCOUNTER — Other Ambulatory Visit: Payer: Self-pay

## 2018-01-23 DIAGNOSIS — J45909 Unspecified asthma, uncomplicated: Secondary | ICD-10-CM | POA: Diagnosis not present

## 2018-01-23 DIAGNOSIS — I10 Essential (primary) hypertension: Secondary | ICD-10-CM | POA: Diagnosis not present

## 2018-01-23 DIAGNOSIS — E119 Type 2 diabetes mellitus without complications: Secondary | ICD-10-CM | POA: Diagnosis not present

## 2018-01-23 DIAGNOSIS — M109 Gout, unspecified: Secondary | ICD-10-CM

## 2018-01-23 DIAGNOSIS — F17210 Nicotine dependence, cigarettes, uncomplicated: Secondary | ICD-10-CM | POA: Diagnosis not present

## 2018-01-23 DIAGNOSIS — M79674 Pain in right toe(s): Secondary | ICD-10-CM | POA: Diagnosis present

## 2018-01-23 DIAGNOSIS — Z794 Long term (current) use of insulin: Secondary | ICD-10-CM | POA: Diagnosis not present

## 2018-01-23 DIAGNOSIS — Z79899 Other long term (current) drug therapy: Secondary | ICD-10-CM | POA: Insufficient documentation

## 2018-01-23 HISTORY — DX: Gout, unspecified: M10.9

## 2018-01-23 LAB — I-STAT CHEM 8, ED
BUN: 22 mg/dL (ref 8–23)
CHLORIDE: 106 mmol/L (ref 98–111)
CREATININE: 1.8 mg/dL — AB (ref 0.61–1.24)
Calcium, Ion: 1.2 mmol/L (ref 1.15–1.40)
Glucose, Bld: 118 mg/dL — ABNORMAL HIGH (ref 70–99)
HCT: 45 % (ref 39.0–52.0)
HEMOGLOBIN: 15.3 g/dL (ref 13.0–17.0)
Potassium: 4.3 mmol/L (ref 3.5–5.1)
Sodium: 138 mmol/L (ref 135–145)
TCO2: 25 mmol/L (ref 22–32)

## 2018-01-23 MED ORDER — HYDROCODONE-ACETAMINOPHEN 5-325 MG PO TABS: 1.0000 | ORAL_TABLET | Freq: Four times a day (QID) | ORAL | 0 refills | Status: DC | PRN

## 2018-01-23 MED ORDER — PREDNISONE 20 MG PO TABS: 40.0000 mg | ORAL_TABLET | Freq: Every day | ORAL | 0 refills | Status: DC

## 2018-01-23 NOTE — ED Notes (Signed)
Declined W/C at D/C and was escorted to lobby by RN. 

## 2018-01-23 NOTE — ED Triage Notes (Signed)
Pt with hx of diabetes and gout presents with right great toe pain and discoloration.  Pt reports noticing the pain starting around thanksgiving.  No open wound to note in triage per pt.

## 2018-01-23 NOTE — ED Provider Notes (Signed)
[facility] EMERGENCY DEPARTMENT Provider Note   CSN: [id number] Arrival date & time: 01/23/18  1335     History   Chief Complaint Chief Complaint  Patient presents with  . Toe Pain    HPI [name] is a 67 y.o. male.  HPI Patient with right-sided great toe pain.  Pain became severe after Thanksgiving.  States that he has gout and this feels the same.  However over the last few days it is starting to turn black in the area.  States the pain is actually improving.  No fevers.  Still has pain however.  No injury to the area. Past Medical History:  Diagnosis Date  . Asthma   . Diabetes mellitus without complication (HCC)   . Gout   . Heartburn   . Hyperlipidemia   . Hypertension   . Obesity   . Stroke [facility])     Patient Active Problem List   Diagnosis Date Noted  . Dehydration 08/31/2016  . Diabetic ketoacidosis, type II (HCC) 02/10/2013  . HYPERLIPIDEMIA 10/12/2006  . DEPRESSIVE DISORDER, MAJOR RCR, UNSPECIFIED 10/12/2006  . TOBACCO USE 10/12/2006  . HYPERTENSION, BENIGN ESSENTIAL 10/12/2006  . FATIGUE 10/12/2006  . GRAVES DISEASE 04/12/2006  . IMPOTENCE INORGANIC 04/12/2006  . MYOCARDIAL INFARCTION, INITIAL EPISODE OF CARE 04/12/2006  . CVA 04/12/2006    Past Surgical History:  Procedure Laterality Date  . KNEE SURGERY  02/2003        Home Medications    Prior to Admission medications   Medication Sig Start Date End Date Taking? Authorizing Provider  aspirin EC 81 MG EC tablet Take 1 tablet (81 mg total) by mouth daily. Patient not taking: Reported on 08/31/2016 02/14/13   Orpah Cobb, MD  atorvastatin (LIPITOR) 40 MG tablet Take 1 tablet (40 mg total) by mouth daily at 6 PM. 09/02/16   Orpah Cobb, MD  calcium-vitamin D (OSCAL WITH D) 500-200 MG-UNIT per tablet Take 1 tablet by mouth 2 (two) times daily. Patient not taking: Reported on 08/31/2016 02/14/13   Orpah Cobb, MD  HYDROcodone-acetaminophen (NORCO/VICODIN) 5-325 MG  tablet Take 1-2 tablets by mouth every 6 (six) hours as needed. 01/23/18   Benjiman Core, MD  insulin glargine (LANTUS) 100 UNIT/ML injection Inject 0.25 mLs (25 Units total) into the skin at bedtime. 09/02/16   Orpah Cobb, MD  Multiple Vitamin (MULTIVITAMIN WITH MINERALS) TABS tablet Take 1 tablet by mouth daily. Patient not taking: Reported on 08/31/2016 02/14/13   Orpah Cobb, MD  predniSONE (DELTASONE) 20 MG tablet Take 2 tablets (40 mg total) by mouth daily. 01/23/18   Benjiman Core, MD  tamsulosin (FLOMAX) 0.4 MG CAPS capsule Take 1 capsule (0.4 mg total) by mouth daily after supper. 09/02/16   Orpah Cobb, MD    Family History Family History  Problem Relation Age of Onset  . Diabetes Sister   . Asthma Other   . Cancer Other   . Hypertension Other   . Stroke Other   . Heart disease Other   . Obesity Other     Social History Social History   Tobacco Use  . Smoking status: Current Every Day Smoker    Types: Cigarettes, Cigars  . Smokeless tobacco: Never Used  Substance Use Topics  . Alcohol use: Yes  . Drug use: No     Allergies   Patient has no known allergies.   Review of Systems Review of Systems  Constitutional: Negative for appetite change and fever.  Respiratory: Negative for  cough.   Cardiovascular: Negative for chest pain.  Gastrointestinal: Negative for abdominal distention.  Musculoskeletal: Positive for joint swelling.       Right first MTP joint pain.  Skin: Positive for color change.  Neurological: Negative for weakness.  Psychiatric/Behavioral: Negative for decreased concentration.     Physical Exam Updated Vital Signs BP 131/64 (BP Location: Right Wrist)   Pulse 77   Temp 97.8 F (36.6 C) (Oral)   Resp 16   Ht 6\' 3"  (1.905 m)   Wt 131.5 kg   SpO2 97%   BMI 36.25 kg/m   Physical Exam  Constitutional: He appears well-developed.  Cardiovascular: Normal rate.  Pulmonary/Chest: Effort normal.  Musculoskeletal: He exhibits  tenderness.  Tenderness over right first MTP.  Medially some darkening of the skin.  No fluctuance.  Neurovascularly intact in right great toe.  Neurological: He is alert.  Skin: Skin is warm. Capillary refill takes less than 2 seconds.     ED Treatments / Results  Labs (all labs ordered are listed, but only abnormal results are displayed) Labs Reviewed  I-STAT CHEM 8, ED - Abnormal; Notable for the following components:      Result Value   Creatinine, Ser 1.80 (*)    Glucose, Bld 118 (*)    All other components within normal limits    EKG None  Radiology No results found.  Procedures Procedures (including critical care time)  Medications Ordered in ED Medications - No data to display   Initial Impression / Assessment and Plan / ED Course  I have reviewed the triage vital signs and the nursing notes.  Pertinent labs & imaging results that were available during my care of the patient were reviewed by me and considered in my medical decision making (see chart for details).     Patient with toe pain.  I think likely gout which he has had before.  Kidney function at baseline.  Will treat with pain medicine and steroids.  Will watch his sugar more closely since it made more difficult control to steroids.  Follow-up as an outpatient.  Final Clinical Impressions(s) / ED Diagnoses   Final diagnoses:  Acute gout involving toe of right foot, unspecified cause    ED Discharge Orders         Ordered    HYDROcodone-acetaminophen (NORCO/VICODIN) 5-325 MG tablet  Every 6 hours PRN     01/23/18 1505    predniSONE (DELTASONE) 20 MG tablet  Daily     01/23/18 1505           Benjiman CorePickering, Raquelle Pietro, MD 01/23/18 1847

## 2018-03-07 DIAGNOSIS — I10 Essential (primary) hypertension: Secondary | ICD-10-CM | POA: Diagnosis not present

## 2018-03-07 DIAGNOSIS — E034 Atrophy of thyroid (acquired): Secondary | ICD-10-CM | POA: Diagnosis not present

## 2018-03-07 DIAGNOSIS — M1000 Idiopathic gout, unspecified site: Secondary | ICD-10-CM | POA: Diagnosis not present

## 2018-03-07 DIAGNOSIS — E119 Type 2 diabetes mellitus without complications: Secondary | ICD-10-CM | POA: Diagnosis not present

## 2018-03-07 DIAGNOSIS — I69351 Hemiplegia and hemiparesis following cerebral infarction affecting right dominant side: Secondary | ICD-10-CM | POA: Diagnosis not present

## 2018-09-19 DIAGNOSIS — E1165 Type 2 diabetes mellitus with hyperglycemia: Secondary | ICD-10-CM | POA: Diagnosis not present

## 2018-09-19 DIAGNOSIS — I2510 Atherosclerotic heart disease of native coronary artery without angina pectoris: Secondary | ICD-10-CM | POA: Diagnosis not present

## 2018-09-19 DIAGNOSIS — I10 Essential (primary) hypertension: Secondary | ICD-10-CM | POA: Diagnosis not present

## 2018-09-19 DIAGNOSIS — I69351 Hemiplegia and hemiparesis following cerebral infarction affecting right dominant side: Secondary | ICD-10-CM | POA: Diagnosis not present

## 2018-12-31 ENCOUNTER — Inpatient Hospital Stay (HOSPITAL_COMMUNITY)
Admission: AD | Admit: 2018-12-31 | Discharge: 2019-01-03 | DRG: 641 | Disposition: A | Discharge status: Home / Self Care | Payer: Medicare HMO | Source: Ambulatory Visit | Attending: Cardiovascular Disease | Admitting: Cardiovascular Disease

## 2018-12-31 ENCOUNTER — Encounter (HOSPITAL_COMMUNITY): Payer: Self-pay

## 2018-12-31 ENCOUNTER — Inpatient Hospital Stay (HOSPITAL_COMMUNITY): Payer: Medicare HMO

## 2018-12-31 ENCOUNTER — Other Ambulatory Visit: Payer: Self-pay

## 2018-12-31 DIAGNOSIS — I69351 Hemiplegia and hemiparesis following cerebral infarction affecting right dominant side: Secondary | ICD-10-CM | POA: Diagnosis not present

## 2018-12-31 DIAGNOSIS — E669 Obesity, unspecified: Secondary | ICD-10-CM | POA: Diagnosis present

## 2018-12-31 DIAGNOSIS — Z825 Family history of asthma and other chronic lower respiratory diseases: Secondary | ICD-10-CM | POA: Diagnosis not present

## 2018-12-31 DIAGNOSIS — E785 Hyperlipidemia, unspecified: Secondary | ICD-10-CM | POA: Diagnosis present

## 2018-12-31 DIAGNOSIS — E860 Dehydration: Secondary | ICD-10-CM | POA: Diagnosis present

## 2018-12-31 DIAGNOSIS — R531 Weakness: Secondary | ICD-10-CM

## 2018-12-31 DIAGNOSIS — I10 Essential (primary) hypertension: Secondary | ICD-10-CM | POA: Diagnosis present

## 2018-12-31 DIAGNOSIS — E1165 Type 2 diabetes mellitus with hyperglycemia: Secondary | ICD-10-CM | POA: Diagnosis present

## 2018-12-31 DIAGNOSIS — Z794 Long term (current) use of insulin: Secondary | ICD-10-CM | POA: Diagnosis not present

## 2018-12-31 DIAGNOSIS — Z6830 Body mass index (BMI) 30.0-30.9, adult: Secondary | ICD-10-CM | POA: Diagnosis not present

## 2018-12-31 DIAGNOSIS — Z8249 Family history of ischemic heart disease and other diseases of the circulatory system: Secondary | ICD-10-CM

## 2018-12-31 DIAGNOSIS — F17210 Nicotine dependence, cigarettes, uncomplicated: Secondary | ICD-10-CM | POA: Diagnosis present

## 2018-12-31 DIAGNOSIS — Z7982 Long term (current) use of aspirin: Secondary | ICD-10-CM | POA: Diagnosis not present

## 2018-12-31 DIAGNOSIS — E034 Atrophy of thyroid (acquired): Secondary | ICD-10-CM | POA: Diagnosis not present

## 2018-12-31 DIAGNOSIS — N390 Urinary tract infection, site not specified: Secondary | ICD-10-CM | POA: Diagnosis present

## 2018-12-31 DIAGNOSIS — Z833 Family history of diabetes mellitus: Secondary | ICD-10-CM

## 2018-12-31 DIAGNOSIS — N182 Chronic kidney disease, stage 2 (mild): Secondary | ICD-10-CM | POA: Diagnosis present

## 2018-12-31 DIAGNOSIS — Z20828 Contact with and (suspected) exposure to other viral communicable diseases: Secondary | ICD-10-CM | POA: Diagnosis present

## 2018-12-31 DIAGNOSIS — Z8673 Personal history of transient ischemic attack (TIA), and cerebral infarction without residual deficits: Secondary | ICD-10-CM | POA: Diagnosis not present

## 2018-12-31 DIAGNOSIS — Z823 Family history of stroke: Secondary | ICD-10-CM

## 2018-12-31 DIAGNOSIS — Z79899 Other long term (current) drug therapy: Secondary | ICD-10-CM | POA: Diagnosis not present

## 2018-12-31 DIAGNOSIS — E1122 Type 2 diabetes mellitus with diabetic chronic kidney disease: Secondary | ICD-10-CM | POA: Diagnosis present

## 2018-12-31 DIAGNOSIS — R634 Abnormal weight loss: Secondary | ICD-10-CM | POA: Diagnosis present

## 2018-12-31 DIAGNOSIS — I129 Hypertensive chronic kidney disease with stage 1 through stage 4 chronic kidney disease, or unspecified chronic kidney disease: Secondary | ICD-10-CM | POA: Diagnosis present

## 2018-12-31 DIAGNOSIS — R2681 Unsteadiness on feet: Secondary | ICD-10-CM | POA: Diagnosis present

## 2018-12-31 DIAGNOSIS — B888 Other specified infestations: Secondary | ICD-10-CM | POA: Diagnosis present

## 2018-12-31 DIAGNOSIS — R2689 Other abnormalities of gait and mobility: Secondary | ICD-10-CM

## 2018-12-31 DIAGNOSIS — J45909 Unspecified asthma, uncomplicated: Secondary | ICD-10-CM | POA: Diagnosis present

## 2018-12-31 DIAGNOSIS — R269 Unspecified abnormalities of gait and mobility: Secondary | ICD-10-CM

## 2018-12-31 HISTORY — DX: Weakness: R53.1

## 2018-12-31 LAB — CBC WITH DIFFERENTIAL/PLATELET
Abs Immature Granulocytes: 0.05 10*3/uL (ref 0.00–0.07)
Basophils Absolute: 0 10*3/uL (ref 0.0–0.1)
Basophils Relative: 0 %
Eosinophils Absolute: 0 10*3/uL (ref 0.0–0.5)
Eosinophils Relative: 0 %
HCT: 42 % (ref 39.0–52.0)
Hemoglobin: 14.1 g/dL (ref 13.0–17.0)
Immature Granulocytes: 1 %
Lymphocytes Relative: 22 %
Lymphs Abs: 1.9 10*3/uL (ref 0.7–4.0)
MCH: 29.9 pg (ref 26.0–34.0)
MCHC: 33.6 g/dL (ref 30.0–36.0)
MCV: 89 fL (ref 80.0–100.0)
Monocytes Absolute: 0.9 10*3/uL (ref 0.1–1.0)
Monocytes Relative: 10 %
Neutro Abs: 5.7 10*3/uL (ref 1.7–7.7)
Neutrophils Relative %: 67 %
Platelets: 328 10*3/uL (ref 150–400)
RBC: 4.72 MIL/uL (ref 4.22–5.81)
RDW: 13.4 % (ref 11.5–15.5)
WBC: 8.6 10*3/uL (ref 4.0–10.5)
nRBC: 0 % (ref 0.0–0.2)

## 2018-12-31 LAB — COMPREHENSIVE METABOLIC PANEL
ALT: 13 U/L (ref 0–44)
AST: 12 U/L — ABNORMAL LOW (ref 15–41)
Albumin: 3.7 g/dL (ref 3.5–5.0)
Alkaline Phosphatase: 65 U/L (ref 38–126)
Anion gap: 13 (ref 5–15)
BUN: 25 mg/dL — ABNORMAL HIGH (ref 8–23)
CO2: 18 mmol/L — ABNORMAL LOW (ref 22–32)
Calcium: 9.5 mg/dL (ref 8.9–10.3)
Chloride: 98 mmol/L (ref 98–111)
Creatinine, Ser: 2.32 mg/dL — ABNORMAL HIGH (ref 0.61–1.24)
GFR calc Af Amer: 32 mL/min — ABNORMAL LOW (ref >60)
GFR calc non Af Amer: 27 mL/min — ABNORMAL LOW (ref >60)
Glucose, Bld: 475 mg/dL — ABNORMAL HIGH (ref 70–99)
Potassium: 4 mmol/L (ref 3.5–5.1)
Sodium: 129 mmol/L — ABNORMAL LOW (ref 135–145)
Total Bilirubin: 1.1 mg/dL (ref 0.3–1.2)
Total Protein: 7.1 g/dL (ref 6.5–8.1)

## 2018-12-31 LAB — TSH: TSH: 0.94 u[IU]/mL (ref 0.350–4.500)

## 2018-12-31 LAB — T4, FREE: Free T4: 1.15 ng/dL — ABNORMAL HIGH (ref 0.61–1.12)

## 2018-12-31 LAB — GLUCOSE, CAPILLARY: Glucose-Capillary: 445 mg/dL — ABNORMAL HIGH (ref 70–99)

## 2018-12-31 MED ORDER — INSULIN ASPART 100 UNIT/ML ~~LOC~~ SOLN
15.0000 [IU] | Freq: Once | SUBCUTANEOUS | Status: AC
  Administered 2018-12-31: 15 [IU] via SUBCUTANEOUS

## 2018-12-31 MED ORDER — LINDANE 1 % EX SHAM
MEDICATED_SHAMPOO | Freq: Two times a day (BID) | CUTANEOUS | Status: AC
  Administered 2018-12-31 – 2019-01-01 (×2): 1 via TOPICAL
  Filled 2018-12-31: qty 60

## 2018-12-31 MED ORDER — SODIUM CHLORIDE 0.9 % IV SOLN
INTRAVENOUS | Status: DC
  Administered 2018-12-31 – 2019-01-02 (×3): via INTRAVENOUS

## 2018-12-31 MED ORDER — INFLUENZA VAC A&B SA ADJ QUAD 0.5 ML IM PRSY
0.5000 mL | PREFILLED_SYRINGE | INTRAMUSCULAR | Status: DC
  Filled 2018-12-31: qty 0.5

## 2018-12-31 MED ORDER — HEPARIN SODIUM (PORCINE) 5000 UNIT/ML IJ SOLN
5000.0000 [IU] | Freq: Three times a day (TID) | INTRAMUSCULAR | Status: DC
  Administered 2018-12-31 – 2019-01-03 (×9): 5000 [IU] via SUBCUTANEOUS
  Filled 2018-12-31 (×9): qty 1

## 2018-12-31 MED ORDER — INSULIN ASPART 100 UNIT/ML ~~LOC~~ SOLN
0.0000 [IU] | Freq: Three times a day (TID) | SUBCUTANEOUS | Status: DC
  Administered 2019-01-01: 15 [IU] via SUBCUTANEOUS
  Administered 2019-01-01: 5 [IU] via SUBCUTANEOUS
  Administered 2019-01-02: 11 [IU] via SUBCUTANEOUS
  Administered 2019-01-02: 8 [IU] via SUBCUTANEOUS
  Administered 2019-01-02 – 2019-01-03 (×3): 3 [IU] via SUBCUTANEOUS

## 2018-12-31 MED ORDER — INSULIN GLARGINE 100 UNIT/ML ~~LOC~~ SOLN
20.0000 [IU] | Freq: Every day | SUBCUTANEOUS | Status: DC
  Administered 2018-12-31 – 2019-01-01 (×2): 20 [IU] via SUBCUTANEOUS
  Filled 2018-12-31 (×3): qty 0.2

## 2018-12-31 NOTE — H&P (Signed)
Referring Physician: Dr. Katherina Right is an 68 y.o. male.                       Chief Complaint: Weakness and unsteady gait  HPI: 68 years old male with PMH of type 2 diabetes mellitus, Gout, hypertension, hyperlipidemia, stroke and obesity has taken his medications intermittently, has lack of appetite and 30 pounds of weight loss in 7 months. He feels weak and has unsteady gait. He did not seek medical help earlier due to fear of COVID-19 pandemic. Nursing staff reported bedbug infestation on his back also.  Past Medical History:  Diagnosis Date  . Asthma   . Diabetes mellitus without complication ([place])   . Gout   . Heartburn   . Hyperlipidemia   . Hypertension   . Obesity   . Stroke [facility])       Past Surgical History:  Procedure Laterality Date  . KNEE SURGERY  02/2003    Family History  Problem Relation Age of Onset  . Diabetes Sister   . Asthma Other   . Cancer Other   . Hypertension Other   . Stroke Other   . Heart disease Other   . Obesity Other    Social History:  reports that he has been smoking cigarettes and cigars. He has never used smokeless tobacco. He reports current alcohol use. He reports that he does not use drugs.  Allergies: No Known Allergies  Medications Prior to Admission  Medication Sig Dispense Refill  . aspirin EC 81 MG EC tablet Take 1 tablet (81 mg total) by mouth daily. (Patient not taking: Reported on 08/31/2016)    . atorvastatin (LIPITOR) 40 MG tablet Take 1 tablet (40 mg total) by mouth daily at 6 PM. 30 tablet 3  . calcium-vitamin D (OSCAL WITH D) 500-200 MG-UNIT per tablet Take 1 tablet by mouth 2 (two) times daily. (Patient not taking: Reported on 08/31/2016)    . HYDROcodone-acetaminophen (NORCO/VICODIN) 5-325 MG tablet Take 1-2 tablets by mouth every 6 (six) hours as needed. 8 tablet 0  . insulin glargine (LANTUS) 100 UNIT/ML injection Inject 0.25 mLs (25 Units total) into the skin at bedtime. 10 mL 11  . Multiple Vitamin  (MULTIVITAMIN WITH MINERALS) TABS tablet Take 1 tablet by mouth daily. (Patient not taking: Reported on 08/31/2016)    . predniSONE (DELTASONE) 20 MG tablet Take 2 tablets (40 mg total) by mouth daily. 8 tablet 0  . tamsulosin (FLOMAX) 0.4 MG CAPS capsule Take 1 capsule (0.4 mg total) by mouth daily after supper. 30 capsule 6    No results found for this or any previous visit (from the past 48 hour(s)). No results found.  Review Of Systems Constitutional: No fever, chills, Positive weight loss. Eyes: Positive vision change, wears glasses. No discharge or pain. Ears: No hearing loss, No tinnitus. Respiratory: No asthma, COPD, pneumonias. Positive shortness of breath. No hemoptysis. Cardiovascular: No chest pain, palpitation, leg edema. Gastrointestinal: Positive nausea, no vomiting, diarrhea, constipation. No GI bleed. No hepatitis. Genitourinary: No dysuria, hematuria, kidney stone. No incontinance. Neurological: No headache, positive stroke, seizures.  Psychiatry: No psych facility admission for anxiety, depression, suicide. No detox. Skin: No rash. Musculoskeletal: Positive joint pain, no fibromyalgia. positive neck pain, back pain. Lymphadenopathy: No lymphadenopathy. Hematology: No anemia or easy bruising.   There were no vitals taken for this visit. There is no height or weight on file to calculate BMI. General appearance: alert, cooperative, appears stated  age and no distress Head: Normocephalic, atraumatic. Eyes: Brown eyes, pink conjunctiva, corneas clear.  Neck: No adenopathy, no carotid bruit, no JVD, supple, symmetrical, trachea midline and thyroid not enlarged. Resp: Clear to auscultation bilaterally. Cardio: Regular rate and rhythm, S1, S2 normal, II/VI systolic murmur, no click, rub or gallop GI: Soft, non-tender; bowel sounds normal; no organomegaly. Extremities: No edema, cyanosis or clubbing. Skin: Warm and dry.  Neurologic: Alert and oriented X 3, decreased  strength. Normal coordination and slow and unsteady gait with use of cane.  Assessment/Plan Dehydration Weakness Gait abnormality HTN Type 2 DM Hyperlipidemia Stroke Obesity  Admit. IV fluids. Glycemic control orders. Blood work, Hgb A1C, thyroid, CBC, CMET etc. Hold medications until blood work is back. Possible CT scan of abdomen. Kwell shampoo for bedbug infestation.  Time spent: Review of old records, Lab, x-rays, EKG, other cardiac tests, examination, discussion with patient and family over 70 minutes.  Ricki Rodriguez, MD  12/31/2018, 7:48 PM

## 2019-01-01 LAB — BASIC METABOLIC PANEL
Anion gap: 13 (ref 5–15)
BUN: 24 mg/dL — ABNORMAL HIGH (ref 8–23)
CO2: 21 mmol/L — ABNORMAL LOW (ref 22–32)
Calcium: 9.4 mg/dL (ref 8.9–10.3)
Chloride: 101 mmol/L (ref 98–111)
Creatinine, Ser: 2.08 mg/dL — ABNORMAL HIGH (ref 0.61–1.24)
GFR calc Af Amer: 36 mL/min — ABNORMAL LOW (ref >60)
GFR calc non Af Amer: 31 mL/min — ABNORMAL LOW (ref >60)
Glucose, Bld: 173 mg/dL — ABNORMAL HIGH (ref 70–99)
Potassium: 3.5 mmol/L (ref 3.5–5.1)
Sodium: 135 mmol/L (ref 135–145)

## 2019-01-01 LAB — URINALYSIS, ROUTINE W REFLEX MICROSCOPIC
Bilirubin Urine: NEGATIVE
Glucose, UA: >=500 mg/dL — AB
Ketones, ur: 5 mg/dL — AB
Nitrite: NEGATIVE
Protein, ur: 30 mg/dL — AB
Specific Gravity, Urine: 1.028 (ref 1.005–1.030)
pH: 5 (ref 5.0–8.0)

## 2019-01-01 LAB — CBC
HCT: 41.1 % (ref 39.0–52.0)
Hemoglobin: 14.1 g/dL (ref 13.0–17.0)
MCH: 30.7 pg (ref 26.0–34.0)
MCHC: 34.3 g/dL (ref 30.0–36.0)
MCV: 89.3 fL (ref 80.0–100.0)
Platelets: 338 10*3/uL (ref 150–400)
RBC: 4.6 MIL/uL (ref 4.22–5.81)
RDW: 13.6 % (ref 11.5–15.5)
WBC: 8.9 10*3/uL (ref 4.0–10.5)
nRBC: 0 % (ref 0.0–0.2)

## 2019-01-01 LAB — URIC ACID: Uric Acid, Serum: 8 mg/dL (ref 3.7–8.6)

## 2019-01-01 LAB — HIV ANTIBODY (ROUTINE TESTING W REFLEX): HIV Screen 4th Generation wRfx: NONREACTIVE

## 2019-01-01 LAB — GLUCOSE, CAPILLARY
Glucose-Capillary: 202 mg/dL — ABNORMAL HIGH (ref 70–99)
Glucose-Capillary: 373 mg/dL — ABNORMAL HIGH (ref 70–99)
Glucose-Capillary: 117 mg/dL — ABNORMAL HIGH (ref 70–99)
Glucose-Capillary: 227 mg/dL — ABNORMAL HIGH (ref 70–99)

## 2019-01-01 LAB — SARS CORONAVIRUS 2 (TAT 6-24 HRS): SARS Coronavirus 2: NEGATIVE

## 2019-01-01 MED ORDER — POTASSIUM CHLORIDE ER 10 MEQ PO TBCR
10.0000 meq | EXTENDED_RELEASE_TABLET | Freq: Every day | ORAL | Status: DC
  Administered 2019-01-01 – 2019-01-03 (×3): 10 meq via ORAL
  Filled 2019-01-01 (×6): qty 1

## 2019-01-01 MED ORDER — ACETAMINOPHEN 325 MG PO TABS
650.0000 mg | ORAL_TABLET | Freq: Four times a day (QID) | ORAL | Status: DC | PRN
  Administered 2019-01-01: 650 mg via ORAL
  Filled 2019-01-01: qty 2

## 2019-01-01 MED ORDER — ATORVASTATIN CALCIUM 40 MG PO TABS: 40.0000 mg | ORAL_TABLET | Freq: Every day | ORAL | Status: DC

## 2019-01-01 MED ORDER — TAMSULOSIN HCL 0.4 MG PO CAPS
0.4000 mg | ORAL_CAPSULE | Freq: Every day | ORAL | Status: DC
  Administered 2019-01-01: 0.4 mg via ORAL
  Filled 2019-01-01 (×2): qty 1

## 2019-01-01 MED ORDER — AMLODIPINE BESYLATE 5 MG PO TABS
5.0000 mg | ORAL_TABLET | Freq: Every day | ORAL | Status: DC
  Administered 2019-01-01 – 2019-01-03 (×3): 5 mg via ORAL
  Filled 2019-01-01 (×3): qty 1

## 2019-01-01 NOTE — Progress Notes (Signed)
Ref: Orpah Cobb, MD   Subjective:  Weakness and gait abnormality continues. VS stable with mild hypertension.  Markedly elevated blood sugars are improving.   Objective:  Vital Signs in the last 24 hours: Temp:  [98.5 F (36.9 C)] 98.5 F (36.9 C) (11/17 1950) Pulse Rate:  [96] 96 (11/17 1950) Resp:  [18] 18 (11/17 1950) BP: (142)/(83) 142/83 (11/17 1950) SpO2:  [100 %] 100 % (11/17 1950) Weight:  [105.7 kg] 105.7 kg (11/17 2046)  Physical Exam: BP Readings from Last 1 Encounters:  12/31/18 (!) 142/83     Wt Readings from Last 1 Encounters:  12/31/18 105.7 kg    Weight change:  Body mass index is 29.12 kg/m. HEENT: [place]/AT, Eyes-Brown, PERL, EOMI, Conjunctiva-Pink, Sclera-Non-icteric Neck: No JVD, No bruit, Trachea midline. Lungs:  Clear, Bilateral. Cardiac:  Regular rhythm, normal S1 and S2, no S3. II/VI systolic murmur. Abdomen:  Soft, non-tender. BS present. Extremities:  No edema present. No cyanosis. No clubbing. CNS: AxOx3, Cranial nerves grossly intact, moves all 4 extremities with right sided weakness.  Skin: Warm and dry.   Intake/Output from previous day: 11/17 0701 - 11/18 0700 In: 440 [P.O.:440] Out: 750 [Urine:750]    Lab Results: BMET    Component Value Date/Time   NA 135 01/01/2019 0304   NA 129 (L) 12/31/2018 1950   NA 138 01/23/2018 1425   K 3.5 01/01/2019 0304   K 4.0 12/31/2018 1950   K 4.3 01/23/2018 1425   CL 101 01/01/2019 0304   CL 98 12/31/2018 1950   CL 106 01/23/2018 1425   CO2 21 (L) 01/01/2019 0304   CO2 18 (L) 12/31/2018 1950   CO2 22 09/02/2016 0420   GLUCOSE 173 (H) 01/01/2019 0304   GLUCOSE 475 (H) 12/31/2018 1950   GLUCOSE 118 (H) 01/23/2018 1425   BUN 24 (H) 01/01/2019 0304   BUN 25 (H) 12/31/2018 1950   BUN 22 01/23/2018 1425   CREATININE 2.08 (H) 01/01/2019 0304   CREATININE 2.32 (H) 12/31/2018 1950   CREATININE 1.80 (H) 01/23/2018 1425   CALCIUM 9.4 01/01/2019 0304   CALCIUM 9.5 12/31/2018 1950   CALCIUM 8.6  (L) 09/02/2016 0420   GFRNONAA 31 (L) 01/01/2019 0304   GFRNONAA 27 (L) 12/31/2018 1950   GFRNONAA 36 (L) 09/02/2016 0420   GFRAA 36 (L) 01/01/2019 0304   GFRAA 32 (L) 12/31/2018 1950   GFRAA 42 (L) 09/02/2016 0420   CBC    Component Value Date/Time   WBC 8.9 01/01/2019 0304   RBC 4.60 01/01/2019 0304   HGB 14.1 01/01/2019 0304   HCT 41.1 01/01/2019 0304   PLT 338 01/01/2019 0304   MCV 89.3 01/01/2019 0304   MCH 30.7 01/01/2019 0304   MCHC 34.3 01/01/2019 0304   RDW 13.6 01/01/2019 0304   LYMPHSABS 1.9 12/31/2018 1950   MONOABS 0.9 12/31/2018 1950   EOSABS 0.0 12/31/2018 1950   BASOSABS 0.0 12/31/2018 1950   HEPATIC Function Panel Recent Labs    12/31/18 1950  PROT 7.1   HEMOGLOBIN A1C No components found for: HGA1C,  MPG CARDIAC ENZYMES Lab Results  Component Value Date   TROPONINI <0.30 02/11/2013   TROPONINI <0.30 02/11/2013   TROPONINI <0.30 02/10/2013   BNP No results for input(s): PROBNP in the last 8760 hours. TSH Recent Labs    12/31/18 1950  TSH 0.940   CHOLESTEROL No results for input(s): CHOL in the last 8760 hours.  Scheduled Meds: . amLODipine  5 mg Oral Daily  . atorvastatin  40 mg Oral q1800  . heparin  5,000 Units Subcutaneous Q8H  . influenza vaccine adjuvanted  0.5 mL Intramuscular Tomorrow-1000  . insulin aspart  0-15 Units Subcutaneous TID WC  . insulin glargine  20 Units Subcutaneous QHS  . lindane shampoo   Topical BID  . potassium chloride  10 mEq Oral Daily  . tamsulosin  0.4 mg Oral QPC supper   Continuous Infusions: . sodium chloride 75 mL/hr at 12/31/18 2337   PRN Meds:.acetaminophen  Assessment/Plan: Dehydration Weakness Gait abnormality Type 2 DM, Uncontrolled with hyperglycemia Hyperlipidemia Stroke Obesity  Continue IV fluids. Add Amlodipine for BP control. Add potassium for low normal potassium level. Continue Insulin. Postpone CT abdomen for now. Consider ace inhibitor if renal function improves  further. Walker with seat for ambulation at home. PT consult.   LOS: 1 day   Time spent including chart review, lab review, examination, discussion with patient and nurse : 30 min   Dixie Dials  MD  01/01/2019, 10:48 AM

## 2019-01-01 NOTE — Progress Notes (Signed)
Inpatient Diabetes Program Recommendations  AACE/ADA: New Consensus Statement on Inpatient Glycemic Control (2015)  Target Ranges:  Prepandial:   less than 140 mg/dL      Peak postprandial:   less than 180 mg/dL (1-2 hours)      Critically ill patients:  140 - 180 mg/dL   Lab Results  Component Value Date   GLUCAP 373 (H) 01/01/2019   HGBA1C 12.5 (H) 08/31/2016    Review of Glycemic Control Results for [name], [name] (MRN [id number]) as of 01/01/2019 13:01  Ref. Range 12/31/2018 20:04 01/01/2019 08:10 01/01/2019 12:10  Glucose-Capillary Latest Ref Range: 70 - 99 mg/dL 445 (H) novolog 15units 202 (H) novolog 5units 373 (H) novolog 15 units    Diabetes history: DM2 Outpatient Diabetes medications: Lantus 25 units HS + Prednisone 20 mg BID Current orders for Inpatient glycemic control: Lantus 20 units QD + Novolog 0-15 TID with meals  Inpatient Diabetes Program Recommendations:    -CBG's 200-400's -Please consider adding meal coverage Novolog 6 units if eats at least 50% of meals + -Novolog 0-5 units HS  Thank you, Geoffry Paradise, RN, BSN Diabetes Coordinator Inpatient Diabetes Program [phone] (team pager from 8a-5p)

## 2019-01-01 NOTE — Progress Notes (Signed)
Son, Mouhamad Teed, called and was updated.

## 2019-01-02 LAB — BASIC METABOLIC PANEL
Anion gap: 9 (ref 5–15)
BUN: 18 mg/dL (ref 8–23)
CO2: 19 mmol/L — ABNORMAL LOW (ref 22–32)
Calcium: 8.7 mg/dL — ABNORMAL LOW (ref 8.9–10.3)
Chloride: 108 mmol/L (ref 98–111)
Creatinine, Ser: 1.54 mg/dL — ABNORMAL HIGH (ref 0.61–1.24)
GFR calc Af Amer: 52 mL/min — ABNORMAL LOW (ref >60)
GFR calc non Af Amer: 45 mL/min — ABNORMAL LOW (ref >60)
Glucose, Bld: 187 mg/dL — ABNORMAL HIGH (ref 70–99)
Potassium: 3.6 mmol/L (ref 3.5–5.1)
Sodium: 136 mmol/L (ref 135–145)

## 2019-01-02 LAB — HEMOGLOBIN A1C
Hgb A1c MFr Bld: >15.5 % — ABNORMAL HIGH (ref 4.8–5.6)
Mean Plasma Glucose: >398 mg/dL

## 2019-01-02 LAB — GLUCOSE, CAPILLARY
Glucose-Capillary: 199 mg/dL — ABNORMAL HIGH (ref 70–99)
Glucose-Capillary: 350 mg/dL — ABNORMAL HIGH (ref 70–99)
Glucose-Capillary: 429 mg/dL — ABNORMAL HIGH (ref 70–99)

## 2019-01-02 MED ORDER — LINAGLIPTIN 5 MG PO TABS
5.0000 mg | ORAL_TABLET | Freq: Every day | ORAL | Status: DC
  Administered 2019-01-02 – 2019-01-03 (×2): 5 mg via ORAL
  Filled 2019-01-02 (×2): qty 1

## 2019-01-02 MED ORDER — INSULIN GLARGINE 100 UNIT/ML ~~LOC~~ SOLN
25.0000 [IU] | Freq: Every day | SUBCUTANEOUS | Status: DC
  Filled 2019-01-02: qty 0.25

## 2019-01-02 MED ORDER — INSULIN ASPART 100 UNIT/ML ~~LOC~~ SOLN
4.0000 [IU] | Freq: Three times a day (TID) | SUBCUTANEOUS | Status: DC
  Administered 2019-01-02 – 2019-01-03 (×4): 4 [IU] via SUBCUTANEOUS

## 2019-01-02 MED ORDER — LOSARTAN POTASSIUM 50 MG PO TABS
25.0000 mg | ORAL_TABLET | Freq: Every day | ORAL | Status: DC
  Administered 2019-01-02 – 2019-01-03 (×2): 25 mg via ORAL
  Filled 2019-01-02 (×2): qty 1

## 2019-01-02 MED ORDER — INSULIN GLARGINE 100 UNIT/ML ~~LOC~~ SOLN
30.0000 [IU] | Freq: Every day | SUBCUTANEOUS | Status: DC
  Administered 2019-01-02: 22:00:00 30 [IU] via SUBCUTANEOUS
  Filled 2019-01-02 (×2): qty 0.3

## 2019-01-02 NOTE — Evaluation (Signed)
Physical Therapy Evaluation Patient Details Name: [name] MRN: [id number] DOB: [birth year] Today's Date: 01/02/2019   History of Present Illness  68 years old male with PMH of type 2 diabetes mellitus, Gout, hypertension, hyperlipidemia, stroke and obesity who presents with 30 pounds of weight loss in 7 months, weakness, and unsteady gait. Nursing staff reporting bedbug infestation on his back also.   Clinical Impression  Pt admitted with above. Prior to admission, pt ambulatory with a cane; [facility] On PT evaluation, pt ambulating 90 feet in room with cane at a supervision level. Education provided re: general cardiovascular exercise recommendations. Will continue to follow acutely to progress mobility.     Follow Up Recommendations No PT follow up    Equipment Recommendations  None recommended by PT    Recommendations for Other Services       Precautions / Restrictions Precautions Precautions: None Restrictions Weight Bearing Restrictions: No      Mobility  Bed Mobility               General bed mobility comments: Sitting on EOB upon arrival  Transfers Overall transfer level: Needs assistance Equipment used: Straight cane Transfers: Sit to/from Stand Sit to Stand: Modified independent (Device/Increase time)            Ambulation/Gait Ambulation/Gait assistance: Supervision Gait Distance (Feet): 90 Feet Assistive device: Straight cane Gait Pattern/deviations: Step-through pattern;Decreased stride length Gait velocity: decreased   General Gait Details: Slow pace, no gross unsteadiness, supervision for line management  Stairs            Wheelchair Mobility    Modified Rankin (Stroke Patients Only)       Balance Overall balance assessment: Mild deficits observed, not formally tested                                           Pertinent Vitals/Pain Pain Assessment: No/denies pain     Home Living Family/patient expects to be discharged to:: Private residence Living Arrangements: Spouse/significant other(partner) Available Help at Discharge: Friend(s);Family;Available PRN/intermittently Type of Home: Apartment Home Access: Elevator     Home Layout: One level Home Equipment: Cane - single point      Prior Function Level of Independence: Independent with assistive device(s)         Comments: uses cane     Hand Dominance        Extremity/Trunk Assessment   Upper Extremity Assessment Upper Extremity Assessment: RUE deficits/detail;LUE deficits/detail RUE Deficits / Details: Strength 5/5 LUE Deficits / Details: Strength 5/5    Lower Extremity Assessment Lower Extremity Assessment: RLE deficits/detail;LLE deficits/detail RLE Deficits / Details: Strength 5/5 LLE Deficits / Details: Strength 5/5    Cervical / Trunk Assessment Cervical / Trunk Assessment: Normal  Communication   Communication: No difficulties  Cognition Arousal/Alertness: Awake/alert Behavior During Therapy: WFL for tasks assessed/performed Overall Cognitive Status: Within Functional Limits for tasks assessed                                        General Comments      Exercises     Assessment/Plan    PT Assessment Patient needs continued PT services  PT Problem List Decreased activity tolerance;Decreased balance;Decreased mobility  PT Treatment Interventions Gait training;Functional mobility training;Therapeutic activities;Therapeutic exercise;Balance training;Patient/family education    PT Goals (Current goals can be found in the Care Plan section)  Acute Rehab PT Goals Patient Stated [facility]: [facility] PT Goal Formulation: With patient Time For Goal Achievement: 01/16/19 Potential to Achieve Goals: Good    Frequency Min 3X/week   Barriers to discharge        Co-evaluation               AM-PAC PT "6 Clicks" Mobility   Outcome Measure Help needed turning from your back to your side while in a flat bed without using bedrails?: None Help needed moving from lying on your back to sitting on the side of a flat bed without using bedrails?: None Help needed moving to and from a bed to a chair (including a wheelchair)?: None Help needed standing up from a chair using your arms (e.g., wheelchair or bedside chair)?: None Help needed to walk in hospital room?: None Help needed climbing 3-5 steps with a railing? : A Little 6 Click Score: 23    End of Session   Activity Tolerance: Patient tolerated treatment well Patient left: in bed;with call bell/phone within reach;with bed alarm set Nurse Communication: Mobility status PT Visit Diagnosis: Difficulty in walking, not elsewhere classified (R26.2)    Time: 4765-4650 PT Time Calculation (min) (ACUTE ONLY): 20 min   Charges:   PT Evaluation $PT Eval Moderate Complexity: 1 Mod          Laurina Bustle, PT, DPT Acute Rehabilitation Services Pager [phone] Office [phone]   Vanetta Mulders 01/02/2019, 3:49 PM

## 2019-01-02 NOTE — Plan of Care (Signed)
  Problem: Activity: Goal: Will identify at least one activity in which they can participate Outcome: Progressing   Problem: Coping: Goal: Ability to interact with others will improve Outcome: Progressing   Problem: Self-Concept: Goal: Will verbalize positive feelings about self Outcome: Progressing

## 2019-01-02 NOTE — Progress Notes (Signed)
Inpatient Diabetes Program Recommendations  AACE/ADA: New Consensus Statement on Inpatient Glycemic Control (2015)  Target Ranges:  Prepandial:   less than 140 mg/dL      Peak postprandial:   less than 180 mg/dL (1-2 hours)      Critically ill patients:  140 - 180 mg/dL   Lab Results  Component Value Date   GLUCAP 350 (H) 01/02/2019   HGBA1C >15.5 (H) 12/31/2018    Review of Glycemic Control Results for MORRIE, DAYWALT (MRN [id number]) as of 01/02/2019 15:43  Ref. Range 01/01/2019 22:46 01/02/2019 07:51 01/02/2019 11:43  Glucose-Capillary Latest Ref Range: 70 - 99 mg/dL 227 (H) 199 (H) 350 (H)   Diabetes history: Type 2 DM Outpatient Diabetes medications: Glipizide 5 mg QD, Lantus 25 units QHS Current orders for Inpatient glycemic control: Tradjenta 5 mg QD, Novolog 4 units TID, Novolog 0-15 units TID, Lantus 20 units QHS  Inpatient Diabetes Program Recommendations:    Noted addition of meal coverage, in agreement.   Spoke to patient regarding outpatient diabetes management. Patient verified home medications, but reports that he wasn't taking for several months and just restarted. When asked, he states, "I just stopped, I had what I needed, I know what I need to do. Now I got to do it and not get off schedule."   Reviewed patient's current A1c of >15.5%. Explained what a A1c is and what it measures. Also reviewed goal A1c with patient, importance of good glucose control @ home, and blood sugar goals. Reviewed patho of DM, need for insulin, vascular changes, when to call MD and commodities.  Patient has a meter and supplies, reports checking 2 times per day with a range of 300-400s mg/dL.  Patient is not interested in endocrinology. Patient has a follow up with Dr Doylene Canard who manages his diabetes. Will attach endo list to DC summary.  Patient has no further questions.   Thanks, Bronson Curb, MSN, RNC-OB Diabetes Coordinator [phone] (8a-5p)

## 2019-01-02 NOTE — Discharge Instructions (Signed)
Local Endocrinologists [facility] [phone]) 1. Dr. Carlus Pavlov 2. Dr. Su [facility] [phone]) 1. Dr. Talmage Coin [facility] [phone]) 1. Dr. Dorisann Frames 2. Dr. Darci Needle [facility] [phone]) 1. Dr. Deirdre Pippins [facility] [phone]) [[place] office]  [phone]) [[place] office] 1. Dr. Efraim Kaufmann Solum 2. Dr. Gelene Mink [facility]) [phone]) 1. Autumn Hudnall Yetta Barre), PA 2. Dr. Izell  3. Dr. Jillyn Ledger. [facility] [phone]) 1. Dr. Marquis Lunch [facility]  [phone]) 1. Dr. Jerelyn Scott 2. Dr. Dessa Phi 3. Dr. Judene Companion 4. Barron Alvine, FNP Dr. Girtha Hake. [facility] in [place] [phone]) Hemoglobin A1c Test Why am I having this test? You may have the hemoglobin A1c test (HbA1c test) done to:  Evaluate your risk for developing diabetes (diabetes mellitus).  Diagnose diabetes.  Monitor long-term control of blood sugar (glucose) in people who have diabetes and help make treatment decisions. This test may be done with other blood glucose tests, such as fasting blood glucose and oral glucose tolerance tests. What is being tested? Hemoglobin is a type of protein in the blood that carries oxygen. Glucose attaches to hemoglobin to form glycated hemoglobin. This test checks the amount of glycated hemoglobin in your blood, which is a good indicator of the average amount of glucose in your blood during the past 2-3 months. What kind of sample is taken?  A blood sample is required for this test. It is usually collected by inserting a needle into a blood vessel. Tell a health care provider about:  All medicines you are taking, including vitamins, herbs, eye drops, creams, and over-the-counter medicines.  Any blood disorders you  have.  Any surgeries you have had.  Any medical conditions you have.  Whether you are pregnant or may be pregnant. How are the results reported? Your results will be reported as a percentage that indicates how much of your hemoglobin has glucose attached to it (is glycated). Your health care provider will compare your results to normal ranges that were established after testing a large group of people (reference ranges). Reference ranges may vary among labs and hospitals. For this test, common reference ranges are:  Adult or child without diabetes: 4-5.6%.  Adult or child with diabetes and good blood glucose control: less than 7%. What do the results mean? If you have diabetes:  A result of less than 7% is considered normal, meaning that your blood glucose is well controlled.  A result higher than 7% means that your blood glucose is not well controlled, and your treatment plan may need to be adjusted. If you do not have diabetes:  A result within the reference range is considered normal, meaning that you are not at high risk for diabetes.  A result of 5.7-6.4% means that you have a high risk of developing diabetes, and you may have prediabetes. Prediabetes is the condition of having a blood glucose level that is higher than it should be, but not high enough for you to be diagnosed with diabetes. Having prediabetes puts you at risk for developing type 2 diabetes (type 2 diabetes mellitus). You may have more tests, including a repeat HbA1c test.  Results of 6.5% or higher on two separate HbA1c tests mean that you have diabetes. You may have more tests to confirm the diagnosis. Abnormally low HbA1c values may be caused by:  Pregnancy.  Severe blood loss.  Receiving donated blood (transfusions).  Low red blood cell count (anemia).  Long-term kidney failure.  Some unusual forms (variants) of hemoglobin. Talk with your health care provider about what your results mean. Questions to  ask your health care provider Ask your health care provider, or the department that is doing the test:  When will my results be ready?  How will I get my results?  What are my treatment options?  What other tests do I need?  What are my next steps? Summary  The hemoglobin A1c test (HbA1c test) may be done to evaluate your risk for developing diabetes, to diagnose diabetes, and to monitor long-term control of blood sugar (glucose) in people who have diabetes and help make treatment decisions.  Hemoglobin is a type of protein in the blood that carries oxygen. Glucose attaches to hemoglobin to form glycated hemoglobin. This test checks the amount of glycated hemoglobin in your blood, which is a good indicator of the average amount of glucose in your blood during the past 2-3 months.  Talk with your health care provider about what your results mean. This information is not intended to replace advice given to you by your health care provider. Make sure you discuss any questions you have with your health care provider. Document Released: 02/22/2004 Document Revised: 01/12/2017 Document Reviewed: 09/12/2016 Elsevier Patient Education  2020 Elsevier Inc.  Hyperglycemia Hyperglycemia occurs when the level of sugar (glucose) in the blood is too high. Glucose is a type of sugar that provides the body's main source of energy. Certain hormones (insulin and glucagon) control the level of glucose in the blood. Insulin lowers blood glucose, and glucagon increases blood glucose. Hyperglycemia can result from having too little insulin in the bloodstream, or from the body not responding normally to insulin. Hyperglycemia occurs most often in people who have diabetes (diabetes mellitus), but it can happen in people who do not have diabetes. It can develop quickly, and it can be life-threatening if it causes you to become severely dehydrated (diabetic ketoacidosis or hyperglycemic hyperosmolar state). Severe  hyperglycemia is a medical emergency. What are the causes? If you have diabetes, hyperglycemia may be caused by:  Diabetes medicine.  Medicines that increase blood glucose or affect your diabetes control.  Not eating enough, or not eating often enough.  Changes in physical activity level.  Being sick or having an infection. If you have prediabetes or undiagnosed diabetes:  Hyperglycemia may be caused by those conditions. If you do not have diabetes, hyperglycemia may be caused by:  Certain medicines, including steroid medicines, beta-blockers, epinephrine, and thiazide diuretics.  Stress.  Serious illness.  Surgery.  Diseases of the pancreas.  Infection. What increases the risk? Hyperglycemia is more likely to develop in people who have risk factors for diabetes, such as:  Having a family member with diabetes.  Having a gene for type 1 diabetes that is passed from parent to child (inherited).  Living in an area with cold weather conditions.  Exposure to certain viruses.  Certain conditions in which the body's disease-fighting (immune) system attacks itself (autoimmune disorders).  Being overweight or obese.  Having an inactive (sedentary) lifestyle.  Having been diagnosed with insulin resistance.  Having a history of prediabetes, gestational diabetes, or polycystic ovarian syndrome (PCOS).  Being of American-Indian, African-American, Hispanic/Latino, or Asian/Pacific Islander descent. What are the signs or symptoms? Hyperglycemia may not cause any symptoms. If you do have symptoms, they may include early warning signs, such as:  Increased thirst.  Hunger.  Feeling very tired.  Needing to urinate more often than usual.  Blurry vision. Other symptoms may develop if hyperglycemia gets worse, such as:  Dry mouth.  Loss of appetite.  Fruity-smelling breath.  Weakness.  Unexpected or rapid weight gain or weight loss.  Tingling or numbness in the  hands or feet.  Headache.  Skin that does not quickly return to normal after being lightly pinched and released (poor skin turgor).  Abdominal pain.  Cuts or bruises that are slow to heal. How is this diagnosed? Hyperglycemia is diagnosed with a blood test to measure your blood glucose level. This blood test is usually done while you are having symptoms. Your health care provider may also do a physical exam and review your medical history. You may have more tests to determine the cause of your hyperglycemia, such as:  A fasting blood glucose (FBG) test. You will not be allowed to eat (you will fast) for at least 8 hours before a blood sample is taken.  An A1c (hemoglobin A1c) blood test. This provides information about blood glucose control over the previous 2-3 months.  An oral glucose tolerance test (OGTT). This measures your blood glucose at two times: ? After fasting. This is your baseline blood glucose level. ? Two hours after drinking a beverage that contains glucose. How is this treated? Treatment depends on the cause of your hyperglycemia. Treatment may include:  Taking medicine to regulate your blood glucose levels. If you take insulin or other diabetes medicines, your medicine or dosage may be adjusted.  Lifestyle changes, such as exercising more, eating healthier foods, or losing weight.  Treating an illness or infection, if this caused your hyperglycemia.  Checking your blood glucose more often.  Stopping or reducing steroid medicines, if these caused your hyperglycemia. If your hyperglycemia becomes severe and it results in hyperglycemic hyperosmolar state, you must be hospitalized and given IV fluids. Follow these instructions at home:  General instructions  Take over-the-counter and prescription medicines only as told by your health care provider.  Do not use any products that contain nicotine or tobacco, such as cigarettes and e-cigarettes. If you need help  quitting, ask your health care provider.  Limit alcohol intake to no more than 1 drink per day for nonpregnant women and 2 drinks per day for men. One drink equals 12 oz of beer, 5 oz of wine, or 1 oz of hard liquor.  Learn to manage stress. If you need help with this, ask your health care provider.  Keep all follow-up visits as told by your health care provider. This is important. Eating and drinking   Maintain a healthy weight.  Exercise regularly, as directed by your health care provider.  Stay hydrated, especially when you exercise, get sick, or spend time in hot temperatures.  Eat healthy foods, such as: ? Lean proteins. ? Complex carbohydrates. ? Fresh fruits and vegetables. ? Low-fat dairy products. ? Healthy fats.  Drink enough fluid to keep your urine clear or pale yellow. If you have diabetes:  Make sure you know the symptoms of hyperglycemia.  Follow your diabetes management plan, as told by your health care provider. Make sure you: ? Take your insulin and medicines as directed. ? Follow your exercise plan. ? Follow your meal plan. Eat on time, and do not skip meals. ? Check your blood glucose as often as directed. Make sure to check your blood glucose before and after exercise. If you exercise longer or in a different way than usual, check your  blood glucose more often. ? Follow your sick day plan whenever you cannot eat or drink normally. Make this plan in advance with your health care provider.  Share your diabetes management plan with people in your workplace, school, and household.  Check your urine for ketones when you are ill and as told by your health care provider.  Carry a medical alert card or wear medical alert jewelry. Contact a health care provider if:  Your blood glucose is at or above 240 mg/dL (13.3 mmol/L) for 2 days in a row.  You have problems keeping your blood glucose in your target range.  You have frequent episodes of  hyperglycemia. Get help right away if:  You have difficulty breathing.  You have a change in how you think, feel, or act (mental status).  You have nausea or vomiting that does not go away. These symptoms may represent a serious problem that is an emergency. Do not wait to see if the symptoms will go away. Get medical help right away. Call your local emergency services (911 in the [place] Do not drive yourself to the hospital. Summary  Hyperglycemia occurs when the level of sugar (glucose) in the blood is too high.  Hyperglycemia is diagnosed with a blood test to measure your blood glucose level. This blood test is usually done while you are having symptoms. Your health care provider may also do a physical exam and review your medical history.  If you have diabetes, follow your diabetes management plan as told by your health care provider.  Contact your health care provider if you have problems keeping your blood glucose in your target range. This information is not intended to replace advice given to you by your health care provider. Make sure you discuss any questions you have with your health care provider. Document Released: 07/26/2000 Document Revised: 10/18/2015 Document Reviewed: 10/18/2015 Elsevier Patient Education  2020 [employer].

## 2019-01-02 NOTE — Progress Notes (Signed)
Spoke to Dr. Doylene Canard. Gave pt 12 units regular insulin. Will continue to monitor.

## 2019-01-02 NOTE — Progress Notes (Signed)
Ref: Dixie Dials, MD   Subjective:  Feeling better. Improving blood sugar control. Improving hypertension. Creatinine is down to 1.54  Objective:  Vital Signs in the last 24 hours: Temp:  [98.3 F (36.8 C)-99 F (37.2 C)] 98.5 F (36.9 C) (11/19 0518) Pulse Rate:  [67-76] 67 (11/19 0518) Resp:  [16] 16 (11/19 0518) BP: (105-126)/(60-79) 126/79 (11/19 0518) SpO2:  [100 %] 100 % (11/19 0518) Weight:  [053 kg] 108 kg (11/19 0518)  Physical Exam: BP Readings from Last 1 Encounters:  01/02/19 126/79     Wt Readings from Last 1 Encounters:  01/02/19 108 kg    Weight change: 2.312 kg Body mass index is 29.76 kg/m. HEENT: [place]/AT, Eyes-Brown, PERL, EOMI, Conjunctiva-Pink, Sclera-Non-icteric Neck: No JVD, No bruit, Trachea midline. Lungs:  Clear, Bilateral. Cardiac:  Regular rhythm, normal S1 and S2, no S3. II/VI systolic murmur. Abdomen:  Soft, non-tender. BS present. Extremities:  No edema present. No cyanosis. No clubbing. CNS: AxOx3, Cranial nerves grossly intact, moves all 4 extremities. Right sided weakness. Skin: Warm and dry.   Intake/Output from previous day: 11/18 0701 - 11/19 0700 In: 600 [I.V.:600] Out: 850 [Urine:850]    Lab Results: BMET    Component Value Date/Time   NA 136 01/02/2019 0259   NA 135 01/01/2019 0304   NA 129 (L) 12/31/2018 1950   K 3.6 01/02/2019 0259   K 3.5 01/01/2019 0304   K 4.0 12/31/2018 1950   CL 108 01/02/2019 0259   CL 101 01/01/2019 0304   CL 98 12/31/2018 1950   CO2 19 (L) 01/02/2019 0259   CO2 21 (L) 01/01/2019 0304   CO2 18 (L) 12/31/2018 1950   GLUCOSE 187 (H) 01/02/2019 0259   GLUCOSE 173 (H) 01/01/2019 0304   GLUCOSE 475 (H) 12/31/2018 1950   BUN 18 01/02/2019 0259   BUN 24 (H) 01/01/2019 0304   BUN 25 (H) 12/31/2018 1950   CREATININE 1.54 (H) 01/02/2019 0259   CREATININE 2.08 (H) 01/01/2019 0304   CREATININE 2.32 (H) 12/31/2018 1950   CALCIUM 8.7 (L) 01/02/2019 0259   CALCIUM 9.4 01/01/2019 0304   CALCIUM 9.5  12/31/2018 1950   GFRNONAA 45 (L) 01/02/2019 0259   GFRNONAA 31 (L) 01/01/2019 0304   GFRNONAA 27 (L) 12/31/2018 1950   GFRAA 52 (L) 01/02/2019 0259   GFRAA 36 (L) 01/01/2019 0304   GFRAA 32 (L) 12/31/2018 1950   CBC    Component Value Date/Time   WBC 8.9 01/01/2019 0304   RBC 4.60 01/01/2019 0304   HGB 14.1 01/01/2019 0304   HCT 41.1 01/01/2019 0304   PLT 338 01/01/2019 0304   MCV 89.3 01/01/2019 0304   MCH 30.7 01/01/2019 0304   MCHC 34.3 01/01/2019 0304   RDW 13.6 01/01/2019 0304   LYMPHSABS 1.9 12/31/2018 1950   MONOABS 0.9 12/31/2018 1950   EOSABS 0.0 12/31/2018 1950   BASOSABS 0.0 12/31/2018 1950   HEPATIC Function Panel Recent Labs    12/31/18 1950  PROT 7.1   HEMOGLOBIN A1C No components found for: HGA1C,  MPG CARDIAC ENZYMES Lab Results  Component Value Date   TROPONINI <0.30 02/11/2013   TROPONINI <0.30 02/11/2013   TROPONINI <0.30 02/10/2013   BNP No results for input(s): PROBNP in the last 8760 hours. TSH Recent Labs    12/31/18 1950  TSH 0.940   CHOLESTEROL No results for input(s): CHOL in the last 8760 hours.  Scheduled Meds: . amLODipine  5 mg Oral Daily  . heparin  5,000 Units  Subcutaneous Q8H  . influenza vaccine adjuvanted  0.5 mL Intramuscular Tomorrow-1000  . insulin aspart  0-15 Units Subcutaneous TID WC  . insulin aspart  4 Units Subcutaneous TID WC  . insulin glargine  20 Units Subcutaneous QHS  . linagliptin  5 mg Oral Daily  . losartan  25 mg Oral Daily  . potassium chloride  10 mEq Oral Daily  . tamsulosin  0.4 mg Oral QPC supper   Continuous Infusions: . sodium chloride 75 mL/hr at 01/02/19 0402   PRN Meds:.acetaminophen  Assessment/Plan: Dehydration, improving Weakness. Improving Gait abnormality Type 2 DM, uncontrolled with hyperglycemia Hyperlipidemia Stroke Obesity  Increase activity. Add meal time insulin. Add Tradjenta. Add small dose Losartan.   LOS: 2 days   Time spent including chart review, lab  review, examination, discussion with patient and diabetic co-ordinator : 30 min   Orpah Cobb  MD  01/02/2019, 11:49 AM

## 2019-01-02 NOTE — TOC Initial Note (Addendum)
Transition of Care [facility]) - Initial/Assessment Note    Patient Details  Name: [name] MRN: [id number] Date of Birth: 12-Apr-1950  Transition of Care [facility]) CM/SW Contact:    Sharin Mons, RN Phone Number: 01/02/2019, 3:27 PM  Clinical Narrative:  Presented with weakness and  dehydration. Hx of DM, gout, HTN, hyperlipidemia, stroke, and obesity. From home alone. Pt resides [address], [address] [place], [place] [id number], [phone], Ellwood Handler ( Secondary school teacher).   Per pt son Margretta Sidle, pt's residence is infested with roaches and bed bugs .  NCM reached out to Secondary school teacher via voice message to discuss conditions of pt's residence. Awaiting call back....      Chaska Hagger (Son)     [phone]       [facility]  Team following for needs...  11/20  Per PT's evaluation noted on 11/19 : No PT follow up.    Expected Discharge Plan: Skilled Nursing Facility Barriers to Discharge: Continued Medical Work up   Patient Goals and CMS Choice        Expected Discharge Plan and Services Expected Discharge Plan: [place]     Prior Living Arrangements/Services   Activities of Daily Living Home Assistive Devices/Equipment: None ADL Screening (condition at time of admission) Patient's cognitive ability adequate to safely complete daily activities?: Yes Is the patient deaf or have difficulty hearing?: No Does the patient have difficulty seeing, even when wearing glasses/contacts?: No Does the patient have difficulty concentrating, remembering, or making decisions?: No Patient able to express need for assistance with ADLs?: Yes Does the patient have difficulty dressing or bathing?: No Independently performs ADLs?: Yes (appropriate for developmental age) Does the patient have difficulty walking or climbing stairs?: Yes Weakness of Legs: Both Weakness of Arms/Hands: None  Permission Sought/Granted                  Emotional  Assessment              Admission diagnosis:  Dehydration Uncontrolled Diabetes Patient Active Problem List   Diagnosis Date Noted  . Weakness 12/31/2018  . Gait abnormality 12/31/2018    Class: Present on Admission  . Dehydration 08/31/2016  . Diabetic ketoacidosis, type II ([place]) 02/10/2013  . HYPERLIPIDEMIA 10/12/2006  . DEPRESSIVE DISORDER, MAJOR RCR, UNSPECIFIED 10/12/2006  . TOBACCO USE 10/12/2006  . HYPERTENSION, BENIGN ESSENTIAL 10/12/2006  . FATIGUE 10/12/2006  . [place] DISEASE 04/12/2006  . IMPOTENCE INORGANIC 04/12/2006  . MYOCARDIAL INFARCTION, INITIAL EPISODE OF CARE 04/12/2006  . CVA 04/12/2006   PCP:  Dixie Dials, MD Pharmacy:   [id number] [place], [address] [place] [address] Phone: [phone] Fax: [phone]     Social Determinants of Health (SDOH) Interventions    Readmission Risk Interventions No flowsheet data found.

## 2019-01-02 NOTE — Progress Notes (Signed)
Pt's CBG is 429. MD paged. Awaiting response for orders. Will continue to monitor.

## 2019-01-03 LAB — BASIC METABOLIC PANEL
Anion gap: 10 (ref 5–15)
BUN: 18 mg/dL (ref 8–23)
CO2: 20 mmol/L — ABNORMAL LOW (ref 22–32)
Calcium: 8.8 mg/dL — ABNORMAL LOW (ref 8.9–10.3)
Chloride: 110 mmol/L (ref 98–111)
Creatinine, Ser: 1.56 mg/dL — ABNORMAL HIGH (ref 0.61–1.24)
GFR calc Af Amer: 51 mL/min — ABNORMAL LOW (ref >60)
GFR calc non Af Amer: 44 mL/min — ABNORMAL LOW (ref >60)
Glucose, Bld: 108 mg/dL — ABNORMAL HIGH (ref 70–99)
Potassium: 4 mmol/L (ref 3.5–5.1)
Sodium: 140 mmol/L (ref 135–145)

## 2019-01-03 LAB — CBC
HCT: 37.2 % — ABNORMAL LOW (ref 39.0–52.0)
Hemoglobin: 12.5 g/dL — ABNORMAL LOW (ref 13.0–17.0)
MCH: 30.6 pg (ref 26.0–34.0)
MCHC: 33.6 g/dL (ref 30.0–36.0)
MCV: 91 fL (ref 80.0–100.0)
Platelets: 276 10*3/uL (ref 150–400)
RBC: 4.09 MIL/uL — ABNORMAL LOW (ref 4.22–5.81)
RDW: 13.6 % (ref 11.5–15.5)
WBC: 7.9 10*3/uL (ref 4.0–10.5)
nRBC: 0 % (ref 0.0–0.2)

## 2019-01-03 LAB — LIPID PANEL
Cholesterol: 171 mg/dL (ref 0–200)
HDL: 46 mg/dL (ref >40)
LDL Cholesterol: 105 mg/dL — ABNORMAL HIGH (ref 0–99)
Total CHOL/HDL Ratio: 3.7 RATIO
Triglycerides: 100 mg/dL (ref <150)
VLDL: 20 mg/dL (ref 0–40)

## 2019-01-03 LAB — GLUCOSE, CAPILLARY
Glucose-Capillary: 167 mg/dL — ABNORMAL HIGH (ref 70–99)
Glucose-Capillary: 195 mg/dL — ABNORMAL HIGH (ref 70–99)

## 2019-01-03 MED ORDER — POTASSIUM CHLORIDE ER 10 MEQ PO TBCR: 10.0000 meq | EXTENDED_RELEASE_TABLET | Freq: Every day | ORAL | 1 refills | Status: DC

## 2019-01-03 MED ORDER — ACETAMINOPHEN 325 MG PO TABS: 650.0000 mg | ORAL_TABLET | Freq: Four times a day (QID) | ORAL | Status: AC | PRN

## 2019-01-03 MED ORDER — CIPROFLOXACIN HCL 250 MG PO TABS: 250.0000 mg | ORAL_TABLET | Freq: Two times a day (BID) | ORAL | 0 refills | Status: DC

## 2019-01-03 MED ORDER — POTASSIUM CHLORIDE ER 10 MEQ PO TBCR: 10.0000 meq | EXTENDED_RELEASE_TABLET | ORAL | 2 refills | Status: DC

## 2019-01-03 MED ORDER — AMLODIPINE BESYLATE 5 MG PO TABS: 5.0000 mg | ORAL_TABLET | Freq: Every day | ORAL | 3 refills | Status: DC

## 2019-01-03 MED ORDER — LINAGLIPTIN 5 MG PO TABS: 5.0000 mg | ORAL_TABLET | Freq: Every day | ORAL | 3 refills | Status: DC

## 2019-01-03 MED ORDER — CIPROFLOXACIN HCL 500 MG PO TABS
250.0000 mg | ORAL_TABLET | Freq: Two times a day (BID) | ORAL | Status: DC
  Administered 2019-01-03: 250 mg via ORAL
  Filled 2019-01-03: qty 1

## 2019-01-03 MED ORDER — INSULIN ASPART 100 UNIT/ML ~~LOC~~ SOLN: 0.0000 [IU] | Freq: Three times a day (TID) | SUBCUTANEOUS | 11 refills | Status: DC

## 2019-01-03 MED ORDER — INSULIN GLARGINE 100 UNITS/ML SOLOSTAR PEN: 30.0000 [IU] | PEN_INJECTOR | Freq: Every day | SUBCUTANEOUS | 11 refills | Status: DC

## 2019-01-03 NOTE — Consult Note (Signed)
   [facility] Inpatient Consult   01/03/2019  [name] Jun 23, 1950 [id number]   Thank you for this consult.  Patient evaluated for [place] Management services.  Patient is not currently a beneficiary of the attributed [place] in the [employer] due to Provider is not a [facility] primary care provider or is not [facility] affiliated.   This patient is Not eligible for [facility] Care Management Services.    Reason: Patient's current primary care provider, Dr. Dixie Dials,  is not an in network provider at this time. Spoke with inpatient [facility] RNCM, referral source, regarding eligibility. Roster reveals patient has not been active with any [facility] Primary Care Provider listed with [facility]. For questions, please call:  Natividad Brood, RN BSN [facility] Liaison  [phone] business mobile phone Toll free office [phone]  Fax number: [phone] [email or web address]

## 2019-01-03 NOTE — Discharge Summary (Signed)
Physician Discharge Summary  Patient ID: [name] MRN: [id number] DOB/AGE: 10-22-1950 68 y.o.  Admit date: 12/31/2018 Discharge date: 01/03/2019  Admission Diagnoses: Dehydration Weakness Gait abnormality Type 2 DM, uncontrolled Hyperlipidemia Stroke Obesity  Discharge Diagnoses:  Principal Problem:   Dehydration, improved Active Problems:   Gait abnormality   HYPERTENSION, BENIGN ESSENTIAL   Weakness   Pre-renal azotemia   CKD, II from HTN and type 2 DM   Type 2 DM with hyperglycemia, improved control   Obesity   Stroke   Hyperlipidemia   UTI  Discharged Condition: fair  Hospital Course: 68 years old black male with PMH of type 2 DM, HTN, Gout, hyperlipidemia, stroke and obesity had 30 pounds of weight loss from lack of appetite. He also had medication non-compliance off and on for 6 months. He improved with IV saline and resumption of Insulin as long acting basal and short acting by sliding scale. Some of his anti-hypertension medications were resumed. His elevated creatinine and blood sugar levels improved. His activity improved and his appetite came back.  His abdomen x-ray was unremarkable and plan for CT abdomen was cancelled for now.  He will have PT evaluation and diabetic teaching for Insulin Pen/needle use prior to discharge. He claims to have good help at home. Wheeled walker was prescribed for improved mobility. Follow up with me in 1 week.  Consults: cardiology  Significant Diagnostic Studies: labs: BUN of 25, Creatinine of 2.32 and blood sugar of 475 mg on admission improved to BUN of 18, Creatinine of 1.56 and blood sugar of 108 mg.  UA showed mild proteinuria and Leukocytes with few bacteria and without nitrite.  EKG: NSR.  Chest x-ray and abdominal x-rays were unremarkable.  Treatments: cardiac meds: lisinopril, potassium, Flomax and amlodipine. Insulin: regular and Lantus  Discharge Exam: Blood pressure 133/74, pulse 75, temperature 98.3  F (36.8 C), temperature source Oral, resp. rate 18, height 6\' 3"  (1.905 m), weight 110.1 kg, SpO2 100 %. General appearance: alert, cooperative and appears stated age. Head: Normocephalic, atraumatic. Eyes: Brown eyes, pink conjunctiva, corneas clear. PERRL, EOM's intact.  Neck: No adenopathy, no carotid bruit, no JVD, supple, symmetrical, trachea midline and thyroid not enlarged. Resp: Clear to auscultation bilaterally. Cardio: Regular rate and rhythm, S1, S2 normal, II/VI systolic murmur, no click, rub or gallop. GI: Soft, non-tender; bowel sounds normal; no organomegaly. Extremities: No edema, cyanosis or clubbing. Skin: Warm and dry.  Neurologic: Alert and oriented X 3, normal strength and tone. Right sided weakness and slow gait with cane use.  Disposition: Discharge disposition: 01-Home or Self Care        Allergies as of 01/03/2019   No Known Allergies     Medication List    STOP taking these medications   glipiZIDE 5 MG 24 hr tablet Commonly known as: GLUCOTROL XL   hydrALAZINE 25 MG tablet Commonly known as: APRESOLINE   insulin glargine 100 UNIT/ML injection Commonly known as: LANTUS Replaced by: insulin glargine 100 unit/mL Sopn   metoprolol tartrate 50 MG tablet Commonly known as: LOPRESSOR   traMADol 50 MG tablet Commonly known as: ULTRAM     TAKE these medications   acetaminophen 325 MG tablet Commonly known as: TYLENOL Take 2 tablets (650 mg total) by mouth every 6 (six) hours as needed for moderate pain.   amLODipine 5 MG tablet Commonly known as: NORVASC Take 1 tablet (5 mg total) by mouth daily. Start taking on: January 04, 2019 What changed:   medication strength  how much to take   ciprofloxacin 250 MG tablet Commonly known as: CIPRO Take 1 tablet (250 mg total) by mouth 2 (two) times daily.   insulin aspart 100 UNIT/ML injection Commonly known as: novoLOG Inject 0-15 Units into the skin 3 (three) times daily with meals.    insulin glargine 100 unit/mL Sopn Commonly known as: LANTUS Inject 0.3 mLs (30 Units total) into the skin daily. Replaces: insulin glargine 100 UNIT/ML injection   linagliptin 5 MG Tabs tablet Commonly known as: TRADJENTA Take 1 tablet (5 mg total) by mouth daily. Start taking on: January 04, 2019   lisinopril 5 MG tablet Commonly known as: ZESTRIL Take 5 mg by mouth daily.   potassium chloride 10 MEQ tablet Commonly known as: KLOR-CON Take 1 tablet (10 mEq total) by mouth every Monday, Wednesday, and Friday.   tamsulosin 0.4 MG Caps capsule Commonly known as: FLOMAX Take 1 capsule (0.4 mg total) by mouth daily after supper.            Durable Medical Equipment  (From admission, onward)         Start     Ordered   01/01/19 1048  For home use only DME 4 wheeled rolling walker with seat  Once    Question:  Patient needs a walker to treat with the following condition  Answer:  Weakness of both legs   01/01/19 1048         F/U Dr. Orpah Cobb in 1 week. Call [phone].  Time spent: Review of old chart, current chart, lab, x-ray, cardiac tests and discussion with patient over 60 minutes.  Signed: Ricki Rodriguez 01/03/2019, 11:07 AM

## 2019-01-03 NOTE — TOC Transition Note (Signed)
Transition of Care [facility]) - CM/SW Discharge Note   Patient Details  Name: [name] MRN: [id number] Date of Birth: Apr 12, 1950  Transition of Care [facility]) CM/SW Contact:  Sharin Mons, RN Phone Number: 01/03/2019, 4:27 PM   Clinical Narrative:  Weakness/dehydration.  Pt will transition to home. States will f/u with bed buds and roaches with Secondary school teacher. DME rolling walker will be delivered to bedside prior to d/c. Pt states has transportation to home and will f/u with PCP for post hospital visit appointment. Shedric Fredericks [facility])       [phone]          PCP: Dixie Dials    Barriers to Discharge: Continued Medical Work up   Patient Goals and CMS Choice     Discharge Placement     Discharge Plan and Services     Social Determinants of Health (SDOH) Interventions     Readmission Risk Interventions No flowsheet data found.

## 2019-01-03 NOTE — Progress Notes (Signed)
Nutrition Consult  RD consulted for nutrition education regarding diabetes.   Lab Results  Component Value Date   HGBA1C >15.5 (H) 12/31/2018    RD provided "Carbohydrate Counting for People with Diabetes" handout from the Academy of Nutrition and Dietetics. Discussed different food groups and their effects on blood sugar, emphasizing carbohydrate-containing foods. Provided list of carbohydrates and recommended serving sizes of common foods.  Discussed importance of controlled and consistent carbohydrate intake throughout the day. Provided examples of ways to balance meals/snacks and encouraged intake of high-fiber, whole grain complex carbohydrates. Teach back method used.  Expect fair compliance.  Body mass index is 30.34 kg/m. Pt meets criteria for obesity based on current BMI.  Current diet order is heart healthy CHO modified, patient is consuming approximately 75-80% of meals at this time. Labs and medications reviewed. No further nutrition interventions warranted at this time. RD contact information provided. If additional nutrition issues arise, please re-consult RD.  Molli Barrows, RD, LDN, [place] Pager [phone] After Hours Pager [phone]

## 2019-01-03 NOTE — Progress Notes (Signed)
Pt given discharge instructions, prescriptions, and care notes. Pt verbalized understanding AEB no further questions or concerns at this time. IV was discontinued, no redness, pain, or swelling noted at this time. Pt left the floor via wheelchair with staff in stable condition. 

## 2019-01-03 NOTE — Progress Notes (Signed)
PT Cancellation Note  Patient Details Name: [name] MRN: [id number] DOB: [birth year]   Cancelled Treatment:    Reason Eval/Treat Not Completed: New PT evaluation received for imminent discharge and chart reviewed. Pt was evaluated by PT yesterday 01/02/2019, and no follow-up PT or DME was recommended. Pt appears to be functioning at a supervision to mod I level with [facility] for support. Discussed pt case with RN who does not report any decline in function or new needs since eval. Please see evaluation for more detail; pt appears to be ready for d/c from a mobility standpoint.   Thelma Comp 01/03/2019, 2:31 PM   Rolinda Roan, PT, DPT Acute Rehabilitation Services Pager: [phone] Office: [phone]

## 2019-01-03 NOTE — Care Management Important Message (Signed)
Important Message  Patient Details  Name: [name] MRN: [id number] Date of Birth: Sep 01, 1950   Medicare Important Message Given:  Yes     Orbie Pyo 01/03/2019, 3:39 PM

## 2019-01-21 DIAGNOSIS — I5022 Chronic systolic (congestive) heart failure: Secondary | ICD-10-CM | POA: Diagnosis not present

## 2019-01-21 DIAGNOSIS — M1000 Idiopathic gout, unspecified site: Secondary | ICD-10-CM | POA: Diagnosis not present

## 2019-01-21 DIAGNOSIS — E034 Atrophy of thyroid (acquired): Secondary | ICD-10-CM | POA: Diagnosis not present

## 2019-01-21 DIAGNOSIS — E119 Type 2 diabetes mellitus without complications: Secondary | ICD-10-CM | POA: Diagnosis not present

## 2019-01-21 DIAGNOSIS — I69351 Hemiplegia and hemiparesis following cerebral infarction affecting right dominant side: Secondary | ICD-10-CM | POA: Diagnosis not present

## 2019-03-18 DIAGNOSIS — E119 Type 2 diabetes mellitus without complications: Secondary | ICD-10-CM | POA: Diagnosis not present

## 2019-03-18 DIAGNOSIS — I10 Essential (primary) hypertension: Secondary | ICD-10-CM | POA: Diagnosis not present

## 2019-03-18 DIAGNOSIS — E034 Atrophy of thyroid (acquired): Secondary | ICD-10-CM | POA: Diagnosis not present

## 2019-03-18 DIAGNOSIS — I69351 Hemiplegia and hemiparesis following cerebral infarction affecting right dominant side: Secondary | ICD-10-CM | POA: Diagnosis not present

## 2019-04-24 DIAGNOSIS — E1165 Type 2 diabetes mellitus with hyperglycemia: Secondary | ICD-10-CM | POA: Diagnosis not present

## 2019-04-24 DIAGNOSIS — D649 Anemia, unspecified: Secondary | ICD-10-CM | POA: Diagnosis not present

## 2019-04-24 DIAGNOSIS — E034 Atrophy of thyroid (acquired): Secondary | ICD-10-CM | POA: Diagnosis not present

## 2019-04-24 DIAGNOSIS — E7849 Other hyperlipidemia: Secondary | ICD-10-CM | POA: Diagnosis not present

## 2019-04-24 DIAGNOSIS — E559 Vitamin D deficiency, unspecified: Secondary | ICD-10-CM | POA: Diagnosis not present

## 2019-04-24 DIAGNOSIS — Z79899 Other long term (current) drug therapy: Secondary | ICD-10-CM | POA: Diagnosis not present

## 2019-04-24 DIAGNOSIS — N429 Disorder of prostate, unspecified: Secondary | ICD-10-CM | POA: Diagnosis not present

## 2019-04-24 DIAGNOSIS — E876 Hypokalemia: Secondary | ICD-10-CM | POA: Diagnosis not present

## 2019-05-08 DIAGNOSIS — I69351 Hemiplegia and hemiparesis following cerebral infarction affecting right dominant side: Secondary | ICD-10-CM | POA: Diagnosis not present

## 2019-05-08 DIAGNOSIS — I10 Essential (primary) hypertension: Secondary | ICD-10-CM | POA: Diagnosis not present

## 2019-05-08 DIAGNOSIS — E119 Type 2 diabetes mellitus without complications: Secondary | ICD-10-CM | POA: Diagnosis not present

## 2019-07-01 DIAGNOSIS — I340 Nonrheumatic mitral (valve) insufficiency: Secondary | ICD-10-CM | POA: Diagnosis not present

## 2019-07-01 DIAGNOSIS — I425 Other restrictive cardiomyopathy: Secondary | ICD-10-CM | POA: Diagnosis not present

## 2019-07-01 DIAGNOSIS — I361 Nonrheumatic tricuspid (valve) insufficiency: Secondary | ICD-10-CM | POA: Diagnosis not present

## 2019-10-21 DIAGNOSIS — E6609 Other obesity due to excess calories: Secondary | ICD-10-CM | POA: Diagnosis not present

## 2019-10-21 DIAGNOSIS — I69351 Hemiplegia and hemiparesis following cerebral infarction affecting right dominant side: Secondary | ICD-10-CM | POA: Diagnosis not present

## 2019-10-21 DIAGNOSIS — E119 Type 2 diabetes mellitus without complications: Secondary | ICD-10-CM | POA: Diagnosis not present

## 2019-10-21 DIAGNOSIS — I10 Essential (primary) hypertension: Secondary | ICD-10-CM | POA: Diagnosis not present

## 2019-11-11 DIAGNOSIS — M109 Gout, unspecified: Secondary | ICD-10-CM | POA: Diagnosis not present

## 2019-11-11 DIAGNOSIS — F1210 Cannabis abuse, uncomplicated: Secondary | ICD-10-CM | POA: Diagnosis not present

## 2019-11-11 DIAGNOSIS — N183 Chronic kidney disease, stage 3 unspecified: Secondary | ICD-10-CM | POA: Diagnosis not present

## 2019-11-11 DIAGNOSIS — E1122 Type 2 diabetes mellitus with diabetic chronic kidney disease: Secondary | ICD-10-CM | POA: Diagnosis not present

## 2019-11-11 DIAGNOSIS — F1411 Cocaine abuse, in remission: Secondary | ICD-10-CM | POA: Diagnosis not present

## 2019-11-11 DIAGNOSIS — I69351 Hemiplegia and hemiparesis following cerebral infarction affecting right dominant side: Secondary | ICD-10-CM | POA: Diagnosis not present

## 2019-11-11 DIAGNOSIS — E034 Atrophy of thyroid (acquired): Secondary | ICD-10-CM | POA: Diagnosis not present

## 2019-11-11 DIAGNOSIS — I5022 Chronic systolic (congestive) heart failure: Secondary | ICD-10-CM | POA: Diagnosis not present

## 2019-11-11 DIAGNOSIS — I700 Atherosclerosis of aorta: Secondary | ICD-10-CM | POA: Diagnosis not present

## 2019-11-11 DIAGNOSIS — Z125 Encounter for screening for malignant neoplasm of prostate: Secondary | ICD-10-CM | POA: Diagnosis not present

## 2020-08-25 ENCOUNTER — Other Ambulatory Visit: Payer: Self-pay

## 2020-08-25 ENCOUNTER — Ambulatory Visit (HOSPITAL_COMMUNITY)
Admission: EM | Admit: 2020-08-25 | Discharge: 2020-08-25 | Disposition: A | Discharge status: Home / Self Care | Payer: Medicare HMO

## 2020-08-25 ENCOUNTER — Encounter (HOSPITAL_COMMUNITY): Payer: Self-pay

## 2020-08-25 NOTE — ED Provider Notes (Signed)
Patient evaluated in triage and reports having numbness to right side that has been worsening for the past month.  Reports similar symptoms to stroke in past.  Patient instructed to go to the ED for further evaluation.     Ivette Loyal, NP 08/25/20 1812

## 2020-08-25 NOTE — ED Triage Notes (Signed)
Pt reports pain, numbness in the right sided of the body from the "head to legs" x 1 month, worse in the past days. ,  Pt reports he had a stroke on 2007 and stopped taking his medications.

## 2020-08-28 ENCOUNTER — Other Ambulatory Visit: Payer: Self-pay

## 2020-08-28 ENCOUNTER — Emergency Department (HOSPITAL_COMMUNITY): Payer: Medicare HMO

## 2020-08-28 ENCOUNTER — Inpatient Hospital Stay (HOSPITAL_COMMUNITY)
Admission: EM | Admit: 2020-08-28 | Discharge: 2020-08-31 | DRG: 638 | Disposition: A | Discharge status: Home / Self Care | Payer: Medicare HMO | Attending: Internal Medicine | Admitting: Internal Medicine

## 2020-08-28 ENCOUNTER — Observation Stay (HOSPITAL_COMMUNITY): Payer: Medicare HMO

## 2020-08-28 DIAGNOSIS — M109 Gout, unspecified: Secondary | ICD-10-CM | POA: Diagnosis present

## 2020-08-28 DIAGNOSIS — J948 Other specified pleural conditions: Secondary | ICD-10-CM

## 2020-08-28 DIAGNOSIS — F17290 Nicotine dependence, other tobacco product, uncomplicated: Secondary | ICD-10-CM | POA: Diagnosis present

## 2020-08-28 DIAGNOSIS — N179 Acute kidney failure, unspecified: Secondary | ICD-10-CM

## 2020-08-28 DIAGNOSIS — E1100 Type 2 diabetes mellitus with hyperosmolarity without nonketotic hyperglycemic-hyperosmolar coma (NKHHC): Secondary | ICD-10-CM | POA: Diagnosis not present

## 2020-08-28 DIAGNOSIS — I10 Essential (primary) hypertension: Secondary | ICD-10-CM

## 2020-08-28 DIAGNOSIS — I129 Hypertensive chronic kidney disease with stage 1 through stage 4 chronic kidney disease, or unspecified chronic kidney disease: Secondary | ICD-10-CM | POA: Diagnosis present

## 2020-08-28 DIAGNOSIS — Z8249 Family history of ischemic heart disease and other diseases of the circulatory system: Secondary | ICD-10-CM

## 2020-08-28 DIAGNOSIS — E785 Hyperlipidemia, unspecified: Secondary | ICD-10-CM

## 2020-08-28 DIAGNOSIS — E0500 Thyrotoxicosis with diffuse goiter without thyrotoxic crisis or storm: Secondary | ICD-10-CM | POA: Diagnosis present

## 2020-08-28 DIAGNOSIS — E870 Hyperosmolality and hypernatremia: Secondary | ICD-10-CM

## 2020-08-28 DIAGNOSIS — E119 Type 2 diabetes mellitus without complications: Secondary | ICD-10-CM | POA: Diagnosis present

## 2020-08-28 DIAGNOSIS — N390 Urinary tract infection, site not specified: Secondary | ICD-10-CM

## 2020-08-28 DIAGNOSIS — J45909 Unspecified asthma, uncomplicated: Secondary | ICD-10-CM | POA: Diagnosis present

## 2020-08-28 DIAGNOSIS — Z794 Long term (current) use of insulin: Secondary | ICD-10-CM

## 2020-08-28 DIAGNOSIS — E1169 Type 2 diabetes mellitus with other specified complication: Secondary | ICD-10-CM

## 2020-08-28 DIAGNOSIS — E1122 Type 2 diabetes mellitus with diabetic chronic kidney disease: Secondary | ICD-10-CM | POA: Diagnosis present

## 2020-08-28 DIAGNOSIS — E860 Dehydration: Secondary | ICD-10-CM

## 2020-08-28 DIAGNOSIS — Z6831 Body mass index (BMI) 31.0-31.9, adult: Secondary | ICD-10-CM

## 2020-08-28 DIAGNOSIS — Z20822 Contact with and (suspected) exposure to covid-19: Secondary | ICD-10-CM | POA: Diagnosis present

## 2020-08-28 DIAGNOSIS — K219 Gastro-esophageal reflux disease without esophagitis: Secondary | ICD-10-CM | POA: Diagnosis present

## 2020-08-28 DIAGNOSIS — N189 Chronic kidney disease, unspecified: Secondary | ICD-10-CM

## 2020-08-28 DIAGNOSIS — N400 Enlarged prostate without lower urinary tract symptoms: Secondary | ICD-10-CM | POA: Diagnosis present

## 2020-08-28 DIAGNOSIS — I69351 Hemiplegia and hemiparesis following cerebral infarction affecting right dominant side: Secondary | ICD-10-CM

## 2020-08-28 DIAGNOSIS — I252 Old myocardial infarction: Secondary | ICD-10-CM

## 2020-08-28 DIAGNOSIS — Z825 Family history of asthma and other chronic lower respiratory diseases: Secondary | ICD-10-CM

## 2020-08-28 DIAGNOSIS — Z8673 Personal history of transient ischemic attack (TIA), and cerebral infarction without residual deficits: Secondary | ICD-10-CM

## 2020-08-28 DIAGNOSIS — Z91128 Patient's intentional underdosing of medication regimen for other reason: Secondary | ICD-10-CM

## 2020-08-28 DIAGNOSIS — F17210 Nicotine dependence, cigarettes, uncomplicated: Secondary | ICD-10-CM | POA: Diagnosis present

## 2020-08-28 DIAGNOSIS — J984 Other disorders of lung: Secondary | ICD-10-CM | POA: Diagnosis present

## 2020-08-28 DIAGNOSIS — R739 Hyperglycemia, unspecified: Secondary | ICD-10-CM | POA: Diagnosis not present

## 2020-08-28 DIAGNOSIS — Z79899 Other long term (current) drug therapy: Secondary | ICD-10-CM

## 2020-08-28 DIAGNOSIS — E669 Obesity, unspecified: Secondary | ICD-10-CM | POA: Diagnosis present

## 2020-08-28 DIAGNOSIS — E0810 Diabetes mellitus due to underlying condition with ketoacidosis without coma: Secondary | ICD-10-CM

## 2020-08-28 DIAGNOSIS — Z9114 Patient's other noncompliance with medication regimen: Secondary | ICD-10-CM

## 2020-08-28 DIAGNOSIS — Z833 Family history of diabetes mellitus: Secondary | ICD-10-CM

## 2020-08-28 DIAGNOSIS — Z823 Family history of stroke: Secondary | ICD-10-CM

## 2020-08-28 DIAGNOSIS — R202 Paresthesia of skin: Secondary | ICD-10-CM

## 2020-08-28 DIAGNOSIS — E1165 Type 2 diabetes mellitus with hyperglycemia: Secondary | ICD-10-CM | POA: Diagnosis present

## 2020-08-28 DIAGNOSIS — N182 Chronic kidney disease, stage 2 (mild): Secondary | ICD-10-CM

## 2020-08-28 HISTORY — DX: Hyperosmolality and hypernatremia: E87.0

## 2020-08-28 HISTORY — DX: Urinary tract infection, site not specified: N39.0

## 2020-08-28 HISTORY — DX: Other specified pleural conditions: J94.8

## 2020-08-28 LAB — URINALYSIS, ROUTINE W REFLEX MICROSCOPIC
Bilirubin Urine: NEGATIVE
Glucose, UA: >=500 mg/dL — AB
Ketones, ur: 20 mg/dL — AB
Nitrite: NEGATIVE
Protein, ur: NEGATIVE mg/dL
RBC / HPF: >50 RBC/hpf — ABNORMAL HIGH (ref 0–5)
Specific Gravity, Urine: 1.028 (ref 1.005–1.030)
pH: 5 (ref 5.0–8.0)

## 2020-08-28 LAB — CBC
HCT: 41.8 % (ref 39.0–52.0)
Hemoglobin: 13.9 g/dL (ref 13.0–17.0)
MCH: 30.8 pg (ref 26.0–34.0)
MCHC: 33.3 g/dL (ref 30.0–36.0)
MCV: 92.7 fL (ref 80.0–100.0)
Platelets: 307 10*3/uL (ref 150–400)
RBC: 4.51 MIL/uL (ref 4.22–5.81)
RDW: 15.1 % (ref 11.5–15.5)
WBC: 7.9 10*3/uL (ref 4.0–10.5)
nRBC: 0 % (ref 0.0–0.2)

## 2020-08-28 LAB — COMPREHENSIVE METABOLIC PANEL
ALT: 13 U/L (ref 0–44)
AST: 13 U/L — ABNORMAL LOW (ref 15–41)
Albumin: 3.4 g/dL — ABNORMAL LOW (ref 3.5–5.0)
Alkaline Phosphatase: 75 U/L (ref 38–126)
Anion gap: 15 (ref 5–15)
BUN: 17 mg/dL (ref 8–23)
CO2: 18 mmol/L — ABNORMAL LOW (ref 22–32)
Calcium: 9 mg/dL (ref 8.9–10.3)
Chloride: 100 mmol/L (ref 98–111)
Creatinine, Ser: 2.22 mg/dL — ABNORMAL HIGH (ref 0.61–1.24)
GFR, Estimated: 31 mL/min — ABNORMAL LOW (ref >60)
Glucose, Bld: 620 mg/dL (ref 70–99)
Potassium: 4.8 mmol/L (ref 3.5–5.1)
Sodium: 133 mmol/L — ABNORMAL LOW (ref 135–145)
Total Bilirubin: 0.9 mg/dL (ref 0.3–1.2)
Total Protein: 7 g/dL (ref 6.5–8.1)

## 2020-08-28 LAB — CBG MONITORING, ED
Glucose-Capillary: 536 mg/dL (ref 70–99)
Glucose-Capillary: 407 mg/dL — ABNORMAL HIGH (ref 70–99)
Glucose-Capillary: 264 mg/dL — ABNORMAL HIGH (ref 70–99)
Glucose-Capillary: 187 mg/dL — ABNORMAL HIGH (ref 70–99)

## 2020-08-28 LAB — LIPASE, BLOOD: Lipase: 34 U/L (ref 11–51)

## 2020-08-28 MED ORDER — DEXTROSE IN LACTATED RINGERS 5 % IV SOLN: INTRAVENOUS | Status: DC

## 2020-08-28 MED ORDER — INSULIN REGULAR(HUMAN) IN NACL 100-0.9 UT/100ML-% IV SOLN
INTRAVENOUS | Status: DC
  Administered 2020-08-28: 1.7 [IU]/h via INTRAVENOUS
  Administered 2020-08-28: 11.3 [IU]/h via INTRAVENOUS
  Filled 2020-08-28: qty 100

## 2020-08-28 MED ORDER — ASPIRIN EC 81 MG PO TBEC
81.0000 mg | DELAYED_RELEASE_TABLET | Freq: Every day | ORAL | Status: DC
  Administered 2020-08-28 – 2020-08-31 (×4): 81 mg via ORAL
  Filled 2020-08-28 (×4): qty 1

## 2020-08-28 MED ORDER — GABAPENTIN 100 MG PO CAPS
100.0000 mg | ORAL_CAPSULE | Freq: Every day | ORAL | Status: DC
  Administered 2020-08-28: 100 mg via ORAL
  Filled 2020-08-28: qty 1

## 2020-08-28 MED ORDER — SODIUM CHLORIDE 0.9 % IV SOLN
1.0000 g | INTRAVENOUS | Status: DC
  Administered 2020-08-29 – 2020-08-31 (×3): 1 g via INTRAVENOUS
  Filled 2020-08-28 (×2): qty 10

## 2020-08-28 MED ORDER — SODIUM CHLORIDE 0.9 % IV SOLN
1.0000 g | Freq: Once | INTRAVENOUS | Status: AC
  Administered 2020-08-28: 1 g via INTRAVENOUS
  Filled 2020-08-28: qty 10

## 2020-08-28 MED ORDER — MORPHINE SULFATE (PF) 2 MG/ML IV SOLN: 1.0000 mg | INTRAVENOUS | Status: DC | PRN

## 2020-08-28 MED ORDER — SODIUM CHLORIDE 0.9 % IV BOLUS
1000.0000 mL | Freq: Once | INTRAVENOUS | Status: AC
  Administered 2020-08-28: 1000 mL via INTRAVENOUS

## 2020-08-28 MED ORDER — ENOXAPARIN SODIUM 40 MG/0.4ML IJ SOSY
40.0000 mg | PREFILLED_SYRINGE | INTRAMUSCULAR | Status: DC
  Administered 2020-08-28 – 2020-08-30 (×3): 40 mg via SUBCUTANEOUS
  Filled 2020-08-28 (×3): qty 0.4

## 2020-08-28 MED ORDER — ATORVASTATIN CALCIUM 40 MG PO TABS
40.0000 mg | ORAL_TABLET | Freq: Every day | ORAL | Status: DC
  Administered 2020-08-28 – 2020-08-31 (×4): 40 mg via ORAL
  Filled 2020-08-28 (×4): qty 1

## 2020-08-28 MED ORDER — LACTATED RINGERS IV SOLN: INTRAVENOUS | Status: DC

## 2020-08-28 MED ORDER — DEXTROSE 50 % IV SOLN: 0.0000 mL | INTRAVENOUS | Status: DC | PRN

## 2020-08-28 NOTE — ED Triage Notes (Signed)
Patient reports right side pain from his right shoulder to his right lower legs. History of a stroke in 2007, right side was the affected side.

## 2020-08-28 NOTE — H&P (Signed)
History and Physical    [name] KGM:[id number] DOB: [birth year] DOA: 08/28/2020  PCP: Orpah Cobb, MD  Patient coming from: Home  I have personally briefly reviewed patient's old medical records in [facility]  Chief Complaint: Right sided pain and numbness  HPI: [name] is a 70 y.o. male with medical history significant for CVA with residual right-sided weakness and paresthesia, history of MI, hypertension, CKD stage 2, , type 2 diabetes, hyperlipidemia, obesity and tobacco use who presents with worsening decided pain.  Patient has history of CVA reportedly in 2007 with residual right-sided weakness.  However in the past month he has noticed the pain to his right side has been much worse.  States pain goes from his head down to his toes.  Also feels right-sided weakness but not focal to any 1 extremity.  Denies any chest pain or shortness of breath.  Denies any nausea, vomiting or diarrhea.  He reports that he stopped taking all of his medication back in March just because he no longer felt like it.  Also polydipsia and polyuria. Denies any alcohol or illicit drug use.  Smokes occasional cigars.  ED Course: He was afebrile, mildly hypertensive systolic of 165.  CBC without leukocytosis or anemia.  Sodium of 133 which corrected to 141 with hyperglycemia of 620.  No anion gap.  Creatinine of 2.22 with a baseline of 2.  UA shows large leukocyte, negative nitrite rare bacteria and budding yeast.  CT of the abdomen shows no acute process but there is development of a nodular subpleural parenchymal scarring within the right lower lobe and CT in 3 months was recommended.  He was started on IV Rocephin for UTI and insulin infusion for his hyperglycemia and hospitalist called for admission.  Review of Systems: Constitutional: No Weight Change, No Fever ENT/Mouth: No sore throat, No Rhinorrhea Eyes: No Eye Pain, No Vision Changes Cardiovascular: No Chest Pain, no  SOB Respiratory: No Cough, No Sputum, No Wheezing, no Dyspnea  Gastrointestinal: No Nausea, No Vomiting, No Diarrhea, No Constipation, No Pain Genitourinary: no Urinary Incontinence, No Urgency, No Flank Pain Musculoskeletal: No Arthralgias, No Myalgias Skin: No Skin Lesions, No Pruritus, Neuro: no Weakness, + Numbness,  No Loss of Consciousness, No Syncope Psych: No Anxiety/Panic, No Depression, no decrease appetite Heme/Lymph: No Bruising, No Bleeding  Past Medical History:  Diagnosis Date   Asthma    Diabetes mellitus without complication (HCC)    Gout    Heartburn    Hyperlipidemia    Hypertension    Obesity    Stroke [facility])     Past Surgical History:  Procedure Laterality Date   KNEE SURGERY  02/2003     reports that he has been smoking cigarettes and cigars. He has never used smokeless tobacco. He reports current alcohol use. He reports that he does not use drugs. Social History  No Known Allergies  Family History  Problem Relation Age of Onset   Diabetes Sister    Asthma Other    Cancer Other    Hypertension Other    Stroke Other    Heart disease Other    Obesity Other      Prior to Admission medications   Medication Sig Start Date End Date Taking? Authorizing Provider  acetaminophen (TYLENOL) 325 MG tablet Take 2 tablets (650 mg total) by mouth every 6 (six) hours as needed for moderate pain. 01/03/19   Orpah Cobb, MD  amLODipine (NORVASC) 5 MG tablet Take 1 tablet (  5 mg total) by mouth daily. 01/04/19   Orpah Cobb, MD  atorvastatin (LIPITOR) 40 MG tablet SMARTSIG:1 Tablet(s) By Mouth Every Evening 04/15/20   [provider]  ciprofloxacin (CIPRO) 250 MG tablet Take 1 tablet (250 mg total) by mouth 2 (two) times daily. 01/03/19   Orpah Cobb, MD  gabapentin (NEURONTIN) 100 MG capsule Take 200 mg by mouth 3 (three) times daily. 04/21/20   [provider]  insulin aspart (NOVOLOG) 100 UNIT/ML injection Inject 0-15 Units into the skin 3  (three) times daily with meals. 01/03/19   Orpah Cobb, MD  LANTUS SOLOSTAR 100 UNIT/ML Solostar Pen Inject 30 Units into the skin at bedtime. 04/09/20   [provider]  linagliptin (TRADJENTA) 5 MG TABS tablet Take 1 tablet (5 mg total) by mouth daily. 01/04/19   Orpah Cobb, MD  lisinopril (ZESTRIL) 5 MG tablet Take 5 mg by mouth daily. 12/09/18   [provider]  potassium chloride (KLOR-CON) 10 MEQ tablet Take 1 tablet (10 mEq total) by mouth every Monday, Wednesday, and Friday. 01/03/19   Orpah Cobb, MD  tamsulosin (FLOMAX) 0.4 MG CAPS capsule Take 1 capsule (0.4 mg total) by mouth daily after supper. 09/02/16   Orpah Cobb, MD    Physical Exam: Vitals:   08/28/20 1700 08/28/20 1715 08/28/20 1730 08/28/20 2015  BP: 132/73 139/80 (!) 148/91 (!) 165/93  Pulse: 88 82 81 73  Resp: 13 17 16 18   Temp:      SpO2: 99% 98% 98% 98%    Constitutional: NAD, calm, comfortable, elderly obese male lying flat in bed Vitals:   08/28/20 1700 08/28/20 1715 08/28/20 1730 08/28/20 2015  BP: 132/73 139/80 (!) 148/91 (!) 165/93  Pulse: 88 82 81 73  Resp: 13 17 16 18   Temp:      SpO2: 99% 98% 98% 98%   Eyes: PERRL, lids and conjunctivae normal ENMT: Mucous membranes are moist.  Neck: normal, suppl Respiratory: clear to auscultation bilaterally, no wheezing, no crackles. Normal respiratory effort. No accessory muscle use.  Cardiovascular: Regular rate and rhythm, no murmurs / rubs / gallops. No extremity edema.  Abdomen: no tenderness, no masses palpated. . Bowel sounds positive.  Musculoskeletal: no clubbing / cyanosis. No joint deformity upper and lower extremities. Good ROM, no contractures. Normal muscle tone.  Skin: no rashes, lesions, ulcers. No induration Neurologic: CN 2-12 grossly intact. Sensation intact. Strength 5/5 in all 4.  Psychiatric: Normal judgment and insight. Alert and oriented x 3. Normal mood.    Labs on Admission: I have personally reviewed  following labs and imaging studies  CBC: Recent Labs  Lab 08/28/20 1655  WBC 7.9  HGB 13.9  HCT 41.8  MCV 92.7  PLT 307   Basic Metabolic Panel: Recent Labs  Lab 08/28/20 1655  NA 133*  K 4.8  CL 100  CO2 18*  GLUCOSE 620*  BUN 17  CREATININE 2.22*  CALCIUM 9.0   GFR: CrCl cannot be calculated (Unknown ideal weight.). Liver Function Tests: Recent Labs  Lab 08/28/20 1655  AST 13*  ALT 13  ALKPHOS 75  BILITOT 0.9  PROT 7.0  ALBUMIN 3.4*   Recent Labs  Lab 08/28/20 1655  LIPASE 34   No results for input(s): AMMONIA in the last 168 hours. Coagulation Profile: No results for input(s): INR, PROTIME in the last 168 hours. Cardiac Enzymes: No results for input(s): CKTOTAL, CKMB, CKMBINDEX, TROPONINI in the last 168 hours. BNP (last 3 results) No results for input(s): PROBNP  in the last 8760 hours. HbA1C: No results for input(s): HGBA1C in the last 72 hours. CBG: Recent Labs  Lab 08/28/20 1925 08/28/20 2107  GLUCAP 536* 407*   Lipid Profile: No results for input(s): CHOL, HDL, LDLCALC, TRIG, CHOLHDL, LDLDIRECT in the last 72 hours. Thyroid Function Tests: No results for input(s): TSH, T4TOTAL, FREET4, T3FREE, THYROIDAB in the last 72 hours. Anemia Panel: No results for input(s): VITAMINB12, FOLATE, FERRITIN, TIBC, IRON, RETICCTPCT in the last 72 hours. Urine analysis:    Component Value Date/Time   COLORURINE YELLOW 08/28/2020 1656   APPEARANCEUR HAZY (A) 08/28/2020 1656   LABSPEC 1.028 08/28/2020 1656   PHURINE 5.0 08/28/2020 1656   GLUCOSEU >=500 (A) 08/28/2020 1656   HGBUR SMALL (A) 08/28/2020 1656   BILIRUBINUR NEGATIVE 08/28/2020 1656   KETONESUR 20 (A) 08/28/2020 1656   PROTEINUR NEGATIVE 08/28/2020 1656   UROBILINOGEN 0.2 03/23/2016 1218   NITRITE NEGATIVE 08/28/2020 1656   LEUKOCYTESUR LARGE (A) 08/28/2020 1656    Radiological Exams on Admission: DG Chest Port 1 View  Result Date: 08/28/2020 CLINICAL DATA:  Portable CXR for right  side chest pain. Pt is non compliant with medicine. See ED Notes: Patient reports right side pain from his right shoulder to his right lower legs. History of a stroke in 2007, right side was the affected sidepain EXAM: PORTABLE CHEST 1 VIEW COMPARISON:  12/31/2018 FINDINGS: Normal mediastinum and cardiac silhouette. Normal pulmonary vasculature. No evidence of effusion, infiltrate, or pneumothorax. No acute bony abnormality. IMPRESSION: No acute cardiopulmonary process. Electronically Signed   By: Genevive Bi M.D.   On: 08/28/2020 17:19   CT Renal Stone Study  Result Date: 08/28/2020 CLINICAL DATA:  Right flank pain EXAM: CT ABDOMEN AND PELVIS WITHOUT CONTRAST TECHNIQUE: Multidetector CT imaging of the abdomen and pelvis was performed following the standard protocol without IV contrast. COMPARISON:  02/13/2013 FINDINGS: Lower chest: There is slightly nodular peripheral parenchymal subpleural scarring, best appreciated on sagittal reformat # 53 within the visualized right lower lobe. The nodular component measures 12 mm x 23 mm at axial image # 5/5 and is indeterminate. While this most likely represents masslike scarring, a underlying pulmonary nodule is difficult to exclude on this examination alone. This appears new since prior examination. The lung bases are otherwise clear. Mild coronary artery calcification. Global cardiac size within normal limits. Hepatobiliary: No focal liver abnormality is seen. No gallstones, gallbladder wall thickening, or biliary dilatation. Pancreas: Unremarkable Spleen: Unremarkable Adrenals/Urinary Tract: The adrenal glands are unremarkable. The kidneys are normal in position. There is marked atrophy and cortical scarring of the right kidney. The left kidney is normal in size. No pelvicaliceal or ureteral calculi. No hydronephrosis. The bladder is unremarkable. Stomach/Bowel: Mild sigmoid diverticulosis. The stomach, small bowel, and large bowel are otherwise unremarkable.  Appendix normal. No free intraperitoneal gas or fluid. Vascular/Lymphatic: Mild aortoiliac atherosclerotic calcification. No aortic aneurysm. No pathologic adenopathy within the abdomen and pelvis. Reproductive: Prostate is unremarkable. Other: No abdominal wall hernia.  The rectum is unremarkable. Musculoskeletal: No acute bone abnormality. IMPRESSION: No acute intra-abdominal pathology identified. No definite radiographic explanation for the patient's reported symptoms. Interval development of nodular subpleural parenchymal scarring within the right lower lobe, not optimally characterized on this examination. A dedicated CT examination in 3 months would be helpful in documenting both stability as well as further characterizing this finding. Marked atrophy and cortical scarring of the right kidney, new since prior examination. Aortic Atherosclerosis (ICD10-I70.0).  Fall Electronically Signed   By: Gloris Ham  Ramiro HarvestParikh MD   On: 08/28/2020 19:15      Assessment/Plan  Type 2 diabetes with hyperglycemia - replete potassium as needed  - continue insulin gtt with goal of 140-180 and AG <12 - IV NS until BG <250, then switch to D5 1/2 NS  - BMP q4hr  - keep NPO   Right sided paresthesia and pain w/hx of CVA  -Possible recrudescence of old stroke made worse by uncontrolled diabetes after being non-complaint with insulin -obtain CT head  -also check TSH since he has hx of documented graves disease -insulin infusion tx as above -resume aspirin daily -Trial Gabapentin qHS for nerve pain  Hypernatremia -corrected Na of 144 -continue D5W as above with BMP Q4hr check  AKI on CKD Stage 2 -creatinine elevated to 2.22 from baseline of 2  -monitor with fluids and repeat labs in the morning   UTI -continue IV Rocephin with urine culture pending   HTN -mildly hypertensive. Continue to monitor. Could benefit from ACE once AKI resolves.   HLD -repeat lipid panel -start atorvastatin   Nodular subpleural  parenchymal scarring RLL - incidental finding on CT. CT chest recommended in 3 months   Medication non-compliance -encouraged to take meds at discharge to avoid further complications   DVT prophylaxis:.Lovenox Code Status: Full Family Communication: Plan discussed with patient at bedside  disposition Plan: Home with observation Consults called:  Admission status: Observation  Level of care: Progressive  Status is: Observation  The patient remains OBS appropriate and will d/c before 2 midnights.  Dispo: The patient is from: Home              Anticipated d/c is to: Home              Patient currently is not medically stable to d/c.   Difficult to place patient No         Anselm Junglinghing T Linell Shawn DO [facility]   If 7PM-7AM, please contact night-coverage [email or web address]   08/28/2020, 9:27 PM

## 2020-08-28 NOTE — ED Provider Notes (Addendum)
[facility] EMERGENCY DEPARTMENT Provider Note   CSN: [id number] Arrival date & time: 08/28/20  1631     History Chief Complaint  Patient presents with   right side pain    TRICE ASPINALL is a 70 y.o. male.  Patient c/o pain to right side of body from head to toes for the past month. Symptoms gradual onset, moderate, constant, persistent, without acute or abrupt change today or this week. Denies any specific exacerbating or alleviating factors. No radicular type pain. Denies specific headaches. No episodic or exertional chest pain. No pleuritic chest pain. Normal appetite. No nvd. No dysuria, hematuria or gu c/o.  Denies extremity swelling. No focal numbness/weakness. Pt indicates out of his meds for past month as well.   The history is provided by the patient.      Past Medical History:  Diagnosis Date   Asthma    Diabetes mellitus without complication (HCC)    Gout    Heartburn    Hyperlipidemia    Hypertension    Obesity    Stroke [facility])     Patient Active Problem List   Diagnosis Date Noted   Weakness 12/31/2018   Gait abnormality 12/31/2018    Class: Present on Admission   Dehydration 08/31/2016   Diabetic ketoacidosis, type II (HCC) 02/10/2013   HYPERLIPIDEMIA 10/12/2006   DEPRESSIVE DISORDER, MAJOR RCR, UNSPECIFIED 10/12/2006   TOBACCO USE 10/12/2006   HYPERTENSION, BENIGN ESSENTIAL 10/12/2006   FATIGUE 10/12/2006   GRAVES DISEASE 04/12/2006   IMPOTENCE INORGANIC 04/12/2006   MYOCARDIAL INFARCTION, INITIAL EPISODE OF CARE 04/12/2006   CVA 04/12/2006    Past Surgical History:  Procedure Laterality Date   KNEE SURGERY  02/2003       Family History  Problem Relation Age of Onset   Diabetes Sister    Asthma Other    Cancer Other    Hypertension Other    Stroke Other    Heart disease Other    Obesity Other     Social History   Tobacco Use   Smoking status: Every Day    Types: Cigarettes, Cigars   Smokeless tobacco: Never   Substance Use Topics   Alcohol use: Yes   Drug use: No    Home Medications Prior to Admission medications   Medication Sig Start Date End Date Taking? Authorizing Provider  acetaminophen (TYLENOL) 325 MG tablet Take 2 tablets (650 mg total) by mouth every 6 (six) hours as needed for moderate pain. 01/03/19   Orpah Cobb, MD  amLODipine (NORVASC) 5 MG tablet Take 1 tablet (5 mg total) by mouth daily. 01/04/19   Orpah Cobb, MD  ciprofloxacin (CIPRO) 250 MG tablet Take 1 tablet (250 mg total) by mouth 2 (two) times daily. 01/03/19   Orpah Cobb, MD  insulin aspart (NOVOLOG) 100 UNIT/ML injection Inject 0-15 Units into the skin 3 (three) times daily with meals. 01/03/19   Orpah Cobb, MD  linagliptin (TRADJENTA) 5 MG TABS tablet Take 1 tablet (5 mg total) by mouth daily. 01/04/19   Orpah Cobb, MD  lisinopril (ZESTRIL) 5 MG tablet Take 5 mg by mouth daily. 12/09/18   [provider]  potassium chloride (KLOR-CON) 10 MEQ tablet Take 1 tablet (10 mEq total) by mouth every Monday, Wednesday, and Friday. 01/03/19   Orpah Cobb, MD  tamsulosin (FLOMAX) 0.4 MG CAPS capsule Take 1 capsule (0.4 mg total) by mouth daily after supper. 09/02/16   Orpah Cobb, MD    Allergies  Patient has no known allergies.  Review of Systems   Review of Systems  Constitutional:  Negative for chills and fever.  HENT:  Negative for sore throat and trouble swallowing.   Eyes:  Negative for visual disturbance.  Respiratory:  Negative for cough and shortness of breath.   Cardiovascular:  Negative for chest pain.  Gastrointestinal:  Negative for abdominal pain, diarrhea and vomiting.  Endocrine: Positive for polyuria.  Genitourinary:  Negative for dysuria and flank pain.  Musculoskeletal:  Negative for back pain and neck pain.  Skin:  Negative for rash.  Neurological:  Negative for speech difficulty, weakness and numbness.  Hematological:  Does not bruise/bleed easily.   Psychiatric/Behavioral:  Negative for confusion.    Physical Exam Updated Vital Signs BP (!) 144/82 (BP Location: Right Arm)   Pulse 98   Temp 98.7 F (37.1 C)   Resp 19   SpO2 98%   Physical Exam Vitals and nursing note reviewed.  Constitutional:      Appearance: Normal appearance. He is well-developed.  HENT:     Head: Atraumatic.     Nose: Nose normal.     Mouth/Throat:     Mouth: Mucous membranes are moist.     Pharynx: Oropharynx is clear.  Eyes:     General: No scleral icterus.    Conjunctiva/sclera: Conjunctivae normal.     Pupils: Pupils are equal, round, and reactive to light.  Neck:     Vascular: No carotid bruit.     Trachea: No tracheal deviation.  Cardiovascular:     Rate and Rhythm: Normal rate and regular rhythm.     Pulses: Normal pulses.     Heart sounds: Normal heart sounds. No murmur heard.   No friction rub. No gallop.  Pulmonary:     Effort: Pulmonary effort is normal. No accessory muscle usage or respiratory distress.     Breath sounds: Normal breath sounds.  Chest:     Chest wall: No tenderness.  Abdominal:     General: Bowel sounds are normal. There is no distension.     Palpations: Abdomen is soft. There is no mass.     Tenderness: There is no abdominal tenderness. There is no guarding or rebound.     Hernia: No hernia is present.  Genitourinary:    Comments: No cva tenderness. Musculoskeletal:        General: No swelling or tenderness.     Cervical back: Normal range of motion and neck supple. No rigidity or tenderness.     Right lower leg: No edema.     Left lower leg: No edema.     Comments: CTLS spine, non tender, aligned, no step off. Good rom bilateral extremities without pain or focal bony tenderness, distal pulses palp and equal bil.   Skin:    General: Skin is warm and dry.     Findings: No rash.  Neurological:     Mental Status: He is alert.     Comments: Alert, speech clear, no dysarthria or aphasia. Motor intact bil, stre  5/5. Sens grossly intact. Steady gait.   Psychiatric:        Mood and Affect: Mood normal.    ED Results / Procedures / Treatments   Labs (all labs ordered are listed, but only abnormal results are displayed) Results for orders placed or performed during the hospital encounter of 08/28/20  CBC  Result Value Ref Range   WBC 7.9 4.0 - 10.5 K/uL   RBC 4.51 4.22 -  5.81 MIL/uL   Hemoglobin 13.9 13.0 - 17.0 g/dL   HCT 94.7 09.6 - 28.3 %   MCV 92.7 80.0 - 100.0 fL   MCH 30.8 26.0 - 34.0 pg   MCHC 33.3 30.0 - 36.0 g/dL   RDW 66.2 94.7 - 65.4 %   Platelets 307 150 - 400 K/uL   nRBC 0.0 0.0 - 0.2 %  Comprehensive metabolic panel  Result Value Ref Range   Sodium 133 (L) 135 - 145 mmol/L   Potassium 4.8 3.5 - 5.1 mmol/L   Chloride 100 98 - 111 mmol/L   CO2 18 (L) 22 - 32 mmol/L   Glucose, Bld 620 (HH) 70 - 99 mg/dL   BUN 17 8 - 23 mg/dL   Creatinine, Ser 6.50 (H) 0.61 - 1.24 mg/dL   Calcium 9.0 8.9 - 35.4 mg/dL   Total Protein 7.0 6.5 - 8.1 g/dL   Albumin 3.4 (L) 3.5 - 5.0 g/dL   AST 13 (L) 15 - 41 U/L   ALT 13 0 - 44 U/L   Alkaline Phosphatase 75 38 - 126 U/L   Total Bilirubin 0.9 0.3 - 1.2 mg/dL   GFR, Estimated 31 (L) >60 mL/min   Anion gap 15 5 - 15  Lipase, blood  Result Value Ref Range   Lipase 34 11 - 51 U/L  Urinalysis, Routine w reflex microscopic Urine, Clean Catch  Result Value Ref Range   Color, Urine YELLOW YELLOW   APPearance HAZY (A) CLEAR   Specific Gravity, Urine 1.028 1.005 - 1.030   pH 5.0 5.0 - 8.0   Glucose, UA >=500 (A) NEGATIVE mg/dL   Hgb urine dipstick SMALL (A) NEGATIVE   Bilirubin Urine NEGATIVE NEGATIVE   Ketones, ur 20 (A) NEGATIVE mg/dL   Protein, ur NEGATIVE NEGATIVE mg/dL   Nitrite NEGATIVE NEGATIVE   Leukocytes,Ua LARGE (A) NEGATIVE   RBC / HPF >50 (H) 0 - 5 RBC/hpf   WBC, UA 21-50 0 - 5 WBC/hpf   Bacteria, UA RARE (A) NONE SEEN   Squamous Epithelial / LPF 0-5 0 - 5   Mucus PRESENT    Budding Yeast PRESENT       EKG EKG  Interpretation  Date/Time:  Saturday August 28 2020 16:46:29 EDT Ventricular Rate:  98 PR Interval:  164 QRS Duration: 73 QT Interval:  337 QTC Calculation: 431 R Axis:   96 Text Interpretation: Sinus rhythm Nonspecific T wave abnormality Confirmed by Cathren Laine ([id number]) on 08/28/2020 4:53:14 PM  Radiology DG Chest Port 1 View  Result Date: 08/28/2020 CLINICAL DATA:  Portable CXR for right side chest pain. Pt is non compliant with medicine. See ED Notes: Patient reports right side pain from his right shoulder to his right lower legs. History of a stroke in 2007, right side was the affected sidepain EXAM: PORTABLE CHEST 1 VIEW COMPARISON:  12/31/2018 FINDINGS: Normal mediastinum and cardiac silhouette. Normal pulmonary vasculature. No evidence of effusion, infiltrate, or pneumothorax. No acute bony abnormality. IMPRESSION: No acute cardiopulmonary process. Electronically Signed   By: Genevive Bi M.D.   On: 08/28/2020 17:19    Procedures Procedures   Medications Ordered in ED Medications - No data to display  ED Course  I have reviewed the triage vital signs and the nursing notes.  Pertinent labs & imaging results that were available during my care of the patient were reviewed by me and considered in my medical decision making (see chart for details).    MDM Rules/Calculators/A&P  Labs sent. Imaging ordered.   Reviewed nursing notes and prior charts for additional history.   Labs reviewed/interpreted by me - wbc and hgb normal.   CXR reviewed/interpreted by me - no pna.  Acetaminophen po. Po fluids. Await additional labs.   Additional labs reviewed/interpreted by me - glucose v high, 620, hco3 mildly low 18, AG mildly elev. Iv ns bolus. Insulin gtt per hyperglycemic crisis orders.  Medicine consulted for admission.  Recheck abd soft nt.   CRITICAL CARE RE: severe hyperglycemia/dka, right flank pain. Performed by: Suzi RootsKevin E Layaan Mott Total critical  care time: 45 minutes Critical care time was exclusive of separately billable procedures and treating other patients. Critical care was necessary to treat or prevent imminent or life-threatening deterioration. Critical care was time spent personally by me on the following activities: development of treatment plan with patient and/or surrogate as well as nursing, discussions with consultants, evaluation of patient's response to treatment, examination of patient, obtaining history from patient or surrogate, ordering and performing treatments and interventions, ordering and review of laboratory studies, ordering and review of radiographic studies, pulse oximetry and re-evaluation of patient's condition.  Patient indicates his cardiologist is Dr Algie CofferKadakia, and indicates no pcp. We tried to reach Dr Algie CofferKadakia, and/or Dr Pearla DubonnetHarwan  x several, and I personally spoke with answering services three separate times, and unable to reach.   Will consult unassigned medicine for admission. Discussed w hospitalist - will admit.         Final Clinical Impression(s) / ED Diagnoses Final diagnoses:  None    Rx / DC Orders ED Discharge Orders     None           Cathren LaineSteinl, Brinley Rosete, MD 08/28/20 2059

## 2020-08-28 NOTE — Progress Notes (Signed)
Report received on pt from [place], RN in ED included SBAR. Awaiting pt arrival to unit.

## 2020-08-29 ENCOUNTER — Encounter (HOSPITAL_COMMUNITY): Payer: Self-pay | Admitting: Family Medicine

## 2020-08-29 DIAGNOSIS — E785 Hyperlipidemia, unspecified: Secondary | ICD-10-CM | POA: Diagnosis present

## 2020-08-29 DIAGNOSIS — E1122 Type 2 diabetes mellitus with diabetic chronic kidney disease: Secondary | ICD-10-CM | POA: Diagnosis present

## 2020-08-29 DIAGNOSIS — R739 Hyperglycemia, unspecified: Secondary | ICD-10-CM | POA: Diagnosis not present

## 2020-08-29 DIAGNOSIS — E669 Obesity, unspecified: Secondary | ICD-10-CM | POA: Diagnosis present

## 2020-08-29 DIAGNOSIS — Z9114 Patient's other noncompliance with medication regimen: Secondary | ICD-10-CM | POA: Diagnosis not present

## 2020-08-29 DIAGNOSIS — E0810 Diabetes mellitus due to underlying condition with ketoacidosis without coma: Secondary | ICD-10-CM | POA: Diagnosis not present

## 2020-08-29 DIAGNOSIS — J45909 Unspecified asthma, uncomplicated: Secondary | ICD-10-CM | POA: Diagnosis present

## 2020-08-29 DIAGNOSIS — Z20822 Contact with and (suspected) exposure to covid-19: Secondary | ICD-10-CM | POA: Diagnosis present

## 2020-08-29 DIAGNOSIS — N400 Enlarged prostate without lower urinary tract symptoms: Secondary | ICD-10-CM | POA: Diagnosis present

## 2020-08-29 DIAGNOSIS — I129 Hypertensive chronic kidney disease with stage 1 through stage 4 chronic kidney disease, or unspecified chronic kidney disease: Secondary | ICD-10-CM | POA: Diagnosis present

## 2020-08-29 DIAGNOSIS — E1100 Type 2 diabetes mellitus with hyperosmolarity without nonketotic hyperglycemic-hyperosmolar coma (NKHHC): Secondary | ICD-10-CM | POA: Diagnosis present

## 2020-08-29 DIAGNOSIS — J984 Other disorders of lung: Secondary | ICD-10-CM | POA: Diagnosis present

## 2020-08-29 DIAGNOSIS — F17290 Nicotine dependence, other tobacco product, uncomplicated: Secondary | ICD-10-CM | POA: Diagnosis present

## 2020-08-29 DIAGNOSIS — Z91128 Patient's intentional underdosing of medication regimen for other reason: Secondary | ICD-10-CM | POA: Diagnosis not present

## 2020-08-29 DIAGNOSIS — N179 Acute kidney failure, unspecified: Secondary | ICD-10-CM | POA: Diagnosis present

## 2020-08-29 DIAGNOSIS — M109 Gout, unspecified: Secondary | ICD-10-CM | POA: Diagnosis present

## 2020-08-29 DIAGNOSIS — E1169 Type 2 diabetes mellitus with other specified complication: Secondary | ICD-10-CM | POA: Diagnosis present

## 2020-08-29 DIAGNOSIS — E0500 Thyrotoxicosis with diffuse goiter without thyrotoxic crisis or storm: Secondary | ICD-10-CM | POA: Diagnosis present

## 2020-08-29 DIAGNOSIS — I69351 Hemiplegia and hemiparesis following cerebral infarction affecting right dominant side: Secondary | ICD-10-CM | POA: Diagnosis not present

## 2020-08-29 DIAGNOSIS — N390 Urinary tract infection, site not specified: Secondary | ICD-10-CM | POA: Diagnosis present

## 2020-08-29 DIAGNOSIS — E860 Dehydration: Secondary | ICD-10-CM | POA: Diagnosis present

## 2020-08-29 DIAGNOSIS — K219 Gastro-esophageal reflux disease without esophagitis: Secondary | ICD-10-CM | POA: Diagnosis present

## 2020-08-29 DIAGNOSIS — I252 Old myocardial infarction: Secondary | ICD-10-CM | POA: Diagnosis not present

## 2020-08-29 DIAGNOSIS — N182 Chronic kidney disease, stage 2 (mild): Secondary | ICD-10-CM | POA: Diagnosis not present

## 2020-08-29 DIAGNOSIS — Z6831 Body mass index (BMI) 31.0-31.9, adult: Secondary | ICD-10-CM | POA: Diagnosis not present

## 2020-08-29 DIAGNOSIS — E1165 Type 2 diabetes mellitus with hyperglycemia: Secondary | ICD-10-CM | POA: Diagnosis present

## 2020-08-29 DIAGNOSIS — E870 Hyperosmolality and hypernatremia: Secondary | ICD-10-CM | POA: Diagnosis present

## 2020-08-29 LAB — BASIC METABOLIC PANEL
Anion gap: 10 (ref 5–15)
Anion gap: 7 (ref 5–15)
Anion gap: 9 (ref 5–15)
BUN: 14 mg/dL (ref 8–23)
BUN: 13 mg/dL (ref 8–23)
BUN: 13 mg/dL (ref 8–23)
CO2: 22 mmol/L (ref 22–32)
CO2: 25 mmol/L (ref 22–32)
CO2: 24 mmol/L (ref 22–32)
Calcium: 8.9 mg/dL (ref 8.9–10.3)
Calcium: 8.9 mg/dL (ref 8.9–10.3)
Calcium: 8.8 mg/dL — ABNORMAL LOW (ref 8.9–10.3)
Chloride: 104 mmol/L (ref 98–111)
Chloride: 107 mmol/L (ref 98–111)
Chloride: 106 mmol/L (ref 98–111)
Creatinine, Ser: 1.65 mg/dL — ABNORMAL HIGH (ref 0.61–1.24)
Creatinine, Ser: 1.53 mg/dL — ABNORMAL HIGH (ref 0.61–1.24)
Creatinine, Ser: 1.46 mg/dL — ABNORMAL HIGH (ref 0.61–1.24)
GFR, Estimated: 44 mL/min — ABNORMAL LOW (ref >60)
GFR, Estimated: 48 mL/min — ABNORMAL LOW (ref >60)
GFR, Estimated: 51 mL/min — ABNORMAL LOW (ref >60)
Glucose, Bld: 175 mg/dL — ABNORMAL HIGH (ref 70–99)
Glucose, Bld: 159 mg/dL — ABNORMAL HIGH (ref 70–99)
Glucose, Bld: 174 mg/dL — ABNORMAL HIGH (ref 70–99)
Potassium: 3.9 mmol/L (ref 3.5–5.1)
Potassium: 3.4 mmol/L — ABNORMAL LOW (ref 3.5–5.1)
Potassium: 3.6 mmol/L (ref 3.5–5.1)
Sodium: 136 mmol/L (ref 135–145)
Sodium: 139 mmol/L (ref 135–145)
Sodium: 139 mmol/L (ref 135–145)

## 2020-08-29 LAB — GLUCOSE, CAPILLARY
Glucose-Capillary: 198 mg/dL — ABNORMAL HIGH (ref 70–99)
Glucose-Capillary: 183 mg/dL — ABNORMAL HIGH (ref 70–99)
Glucose-Capillary: 209 mg/dL — ABNORMAL HIGH (ref 70–99)
Glucose-Capillary: 152 mg/dL — ABNORMAL HIGH (ref 70–99)
Glucose-Capillary: 153 mg/dL — ABNORMAL HIGH (ref 70–99)
Glucose-Capillary: 156 mg/dL — ABNORMAL HIGH (ref 70–99)
Glucose-Capillary: 177 mg/dL — ABNORMAL HIGH (ref 70–99)
Glucose-Capillary: 371 mg/dL — ABNORMAL HIGH (ref 70–99)
Glucose-Capillary: 326 mg/dL — ABNORMAL HIGH (ref 70–99)
Glucose-Capillary: 219 mg/dL — ABNORMAL HIGH (ref 70–99)

## 2020-08-29 LAB — LIPID PANEL
Cholesterol: 165 mg/dL (ref 0–200)
HDL: 42 mg/dL (ref >40)
LDL Cholesterol: 102 mg/dL — ABNORMAL HIGH (ref 0–99)
Total CHOL/HDL Ratio: 3.9 RATIO
Triglycerides: 103 mg/dL (ref <150)
VLDL: 21 mg/dL (ref 0–40)

## 2020-08-29 LAB — URINE CULTURE: Culture: 30000 — AB

## 2020-08-29 LAB — RESP PANEL BY RT-PCR (FLU A&B, COVID) ARPGX2
Influenza A by PCR: NEGATIVE
Influenza B by PCR: NEGATIVE
SARS Coronavirus 2 by RT PCR: NEGATIVE

## 2020-08-29 LAB — TSH: TSH: 0.977 u[IU]/mL (ref 0.350–4.500)

## 2020-08-29 LAB — BETA-HYDROXYBUTYRIC ACID: Beta-Hydroxybutyric Acid: 1.02 mmol/L — ABNORMAL HIGH (ref 0.05–0.27)

## 2020-08-29 MED ORDER — ONDANSETRON HCL 4 MG/2ML IJ SOLN: 4.0000 mg | Freq: Four times a day (QID) | INTRAMUSCULAR | Status: DC | PRN

## 2020-08-29 MED ORDER — AMLODIPINE BESYLATE 10 MG PO TABS
10.0000 mg | ORAL_TABLET | Freq: Every day | ORAL | Status: DC
  Administered 2020-08-29 – 2020-08-31 (×3): 10 mg via ORAL
  Filled 2020-08-29 (×3): qty 1

## 2020-08-29 MED ORDER — ACETAMINOPHEN 325 MG PO TABS: 650.0000 mg | ORAL_TABLET | Freq: Four times a day (QID) | ORAL | Status: DC | PRN

## 2020-08-29 MED ORDER — INSULIN ASPART 100 UNIT/ML IJ SOLN
5.0000 [IU] | Freq: Three times a day (TID) | INTRAMUSCULAR | Status: DC
  Administered 2020-08-29 – 2020-08-31 (×6): 5 [IU] via SUBCUTANEOUS

## 2020-08-29 MED ORDER — INSULIN ASPART 100 UNIT/ML IJ SOLN: 5.0000 [IU] | Freq: Three times a day (TID) | INTRAMUSCULAR | Status: DC

## 2020-08-29 MED ORDER — PANTOPRAZOLE SODIUM 40 MG PO TBEC
40.0000 mg | DELAYED_RELEASE_TABLET | Freq: Every day | ORAL | Status: DC
  Administered 2020-08-29 – 2020-08-31 (×3): 40 mg via ORAL
  Filled 2020-08-29 (×3): qty 1

## 2020-08-29 MED ORDER — ISOSORBIDE MONONITRATE ER 30 MG PO TB24
30.0000 mg | ORAL_TABLET | Freq: Every day | ORAL | Status: DC
  Administered 2020-08-29 – 2020-08-31 (×3): 30 mg via ORAL
  Filled 2020-08-29 (×3): qty 1

## 2020-08-29 MED ORDER — TAMSULOSIN HCL 0.4 MG PO CAPS
0.4000 mg | ORAL_CAPSULE | Freq: Every day | ORAL | Status: DC
  Administered 2020-08-29 – 2020-08-30 (×2): 0.4 mg via ORAL
  Filled 2020-08-29 (×2): qty 1

## 2020-08-29 MED ORDER — LACTATED RINGERS IV SOLN: INTRAVENOUS | Status: AC

## 2020-08-29 MED ORDER — INSULIN GLARGINE 100 UNIT/ML ~~LOC~~ SOLN
35.0000 [IU] | Freq: Every day | SUBCUTANEOUS | Status: DC
  Administered 2020-08-29: 35 [IU] via SUBCUTANEOUS
  Filled 2020-08-29 (×2): qty 0.35

## 2020-08-29 MED ORDER — GABAPENTIN 100 MG PO CAPS
200.0000 mg | ORAL_CAPSULE | Freq: Three times a day (TID) | ORAL | Status: DC
  Administered 2020-08-29 – 2020-08-31 (×7): 200 mg via ORAL
  Filled 2020-08-29 (×7): qty 2

## 2020-08-29 MED ORDER — INSULIN ASPART 100 UNIT/ML IJ SOLN
0.0000 [IU] | Freq: Three times a day (TID) | INTRAMUSCULAR | Status: DC
  Administered 2020-08-29: 15 [IU] via SUBCUTANEOUS
  Administered 2020-08-29: 11 [IU] via SUBCUTANEOUS
  Administered 2020-08-30: 8 [IU] via SUBCUTANEOUS
  Administered 2020-08-30: 5 [IU] via SUBCUTANEOUS
  Administered 2020-08-30: 8 [IU] via SUBCUTANEOUS
  Administered 2020-08-31: 5 [IU] via SUBCUTANEOUS

## 2020-08-29 MED ORDER — INSULIN ASPART 100 UNIT/ML IJ SOLN: 3.0000 [IU] | Freq: Three times a day (TID) | INTRAMUSCULAR | Status: DC

## 2020-08-29 MED ORDER — INSULIN ASPART 100 UNIT/ML IJ SOLN
0.0000 [IU] | Freq: Every day | INTRAMUSCULAR | Status: DC
  Administered 2020-08-29 – 2020-08-30 (×2): 2 [IU] via SUBCUTANEOUS

## 2020-08-29 MED ORDER — CARVEDILOL 3.125 MG PO TABS
3.1250 mg | ORAL_TABLET | Freq: Two times a day (BID) | ORAL | Status: DC
  Administered 2020-08-29 – 2020-08-31 (×4): 3.125 mg via ORAL
  Filled 2020-08-29 (×4): qty 1

## 2020-08-29 NOTE — Progress Notes (Signed)
Pt CBG 169 at this time. Insulin gtt rate change per End[facility] from 1.7 units/hr to 1.9 units/hr.

## 2020-08-29 NOTE — Progress Notes (Signed)
PROGRESS NOTE                                                                                                                                                                                                             Patient Demographics:    [name], is a 70 y.o. male, DOB - [birth year], ZOX:[id number]  Outpatient Primary MD for the patient is Orpah Cobb, MD    LOS - 0  Admit date - 08/28/2020    Chief Complaint  Patient presents with   right side pain       Brief Narrative (HPI from H&P) - [name] is a 70 y.o. male with medical history significant for CVA with residual right-sided weakness and paresthesia, history of MI, hypertension, CKD stage 2, , type 2 diabetes, hyperlipidemia, obesity and tobacco use who presents with worsening decided pain, he apparently stopped taking his insulin, blood pressure medications and Neurontin few months ago for no good reason, he says that he has chronic right-sided pain which got worse over the last few days.  In the ER he was found to have blood sugar in 700s with nonketotic hyperosmolar state and he was admitted to the hospital   Subjective:    [name] today has, No headache, No chest pain, No abdominal pain - No Nausea, No new weakness tingling or numbness, no SOB   Assessment  & Plan :     NKH in DM2 - due to noncompliance with medications, strictly counseled, has been placed on Lantus, Premeal NovoLog and sliding scale.  Will receive diabetic and insulin education.  Titrate off insulin drip, continue IV fluids for hydration.   Lab Results  Component Value Date   HGBA1C >15.5 (H) 12/31/2018   CBG (last 3)  Recent Labs    08/29/20 0731 08/29/20 0833 08/29/20 1115  GLUCAP 156* 177* 371*    2. HTN - placed on combination of Norvasc, Imdur and Coreg.  Monitor and adjust.  3.  History of CVA with residual right-sided weakness and neuropathic pain.   Replaced on aspirin, statin and Neurontin.  PT-OT.  4.  Dyslipidemia.  Placed on statin.  5.  BPH.  Placed on Flomax.  6.  GERD.  Placed on PPI.  7.  Dehydration, hypernatremia and AKI.  Due to #1 above.  Hydrate with IV fluids  improving.  Has underlying CKD 3B with baseline creatinine between 1.5 and 1.7.   Obesity: Follow with PCP Estimated body mass index is 31 kg/m as calculated from the following:   Height as of this encounter: 6\' 3"  (1.905 m).   Weight as of this encounter: 112.5 kg.          Condition - Fair  Family Communication  :  None present  Code Status :  Full  Consults  :  None  PUD Prophylaxis : PPI   Procedures  :            Disposition Plan  :    Status is: Observation  Dispo: The patient is from: Home              Anticipated d/c is to: Home              Patient currently is not medically stable to d/c.   Difficult to place patient No  DVT Prophylaxis  :    enoxaparin (LOVENOX) injection 40 mg Start: 08/28/20 2115  Lab Results  Component Value Date   PLT 307 08/28/2020    Diet :  Diet Order             Diet heart healthy/carb modified Room service appropriate? Yes; Fluid consistency: Thin  Diet effective now                    Inpatient Medications  Scheduled Meds:  amLODipine  10 mg Oral Daily   aspirin EC  81 mg Oral Daily   atorvastatin  40 mg Oral Daily   carvedilol  3.125 mg Oral BID WC   enoxaparin (LOVENOX) injection  40 mg Subcutaneous Q24H   gabapentin  200 mg Oral TID   insulin aspart  0-15 Units Subcutaneous TID WC   insulin aspart  0-5 Units Subcutaneous QHS   insulin aspart  5 Units Subcutaneous TID WC   insulin glargine  35 Units Subcutaneous Daily   isosorbide mononitrate  30 mg Oral Daily   tamsulosin  0.4 mg Oral QPC supper   Continuous Infusions:  cefTRIAXone (ROCEPHIN)  IV 1 g (08/29/20 0945)   lactated ringers 75 mL/hr at 08/29/20 0856   PRN Meds:.acetaminophen, dextrose, ondansetron  (ZOFRAN) IV  Antibiotics  :    Anti-infectives (From admission, onward)    Start     Dose/Rate Route Frequency Ordered Stop   08/29/20 1000  cefTRIAXone (ROCEPHIN) 1 g in sodium chloride 0.9 % 100 mL IVPB        1 g 200 mL/hr over 30 Minutes Intravenous Every 24 hours 08/28/20 2107     08/28/20 1900  cefTRIAXone (ROCEPHIN) 1 g in sodium chloride 0.9 % 100 mL IVPB        1 g 200 mL/hr over 30 Minutes Intravenous  Once 08/28/20 1846 08/28/20 2017        Time Spent in minutes  30   Susa RaringPrashant Zacari Stiff M.D on 08/29/2020 at 11:23 AM  To page go to [email or web address]   [facility] -  Office  [phone]   See all Orders from today for further details    Objective:   Vitals:   08/29/20 0010 08/29/20 0733 08/29/20 1100 08/29/20 1118  BP:  (!) 147/77  (!) 154/84  Pulse:  65  77  Resp:  13  15  Temp: 98.6 F (37 C) 98.4 F (36.9 C)  97.9 F (36.6 C)  TempSrc: Oral Oral  Oral  SpO2:  100% 100%   Weight: 112.5 kg     Height: 6\' 3"  (1.905 m)       Wt Readings from Last 3 Encounters:  08/29/20 112.5 kg  01/03/19 110.1 kg  01/23/18 131.5 kg     Intake/Output Summary (Last 24 hours) at 08/29/2020 1123 Last data filed at 08/29/2020 0643 Gross per 24 hour  Intake 576.63 ml  Output 300 ml  Net 276.63 ml     Physical Exam  Awake Alert, No new F.N deficits, chronic right-sided weakness strength 4/5. [place].AT,PERRAL Supple Neck,No JVD, No cervical lymphadenopathy appriciated.  Symmetrical Chest wall movement, Good air movement bilaterally, CTAB RRR,No Gallops,Rubs or new Murmurs, No Parasternal Heave +ve B.Sounds, Abd Soft, No tenderness, No organomegaly appriciated, No rebound - guarding or rigidity. No Cyanosis, Clubbing or edema, No new Rash or bruise      RN pressure injury documentation:     Data Review:    CBC Recent Labs  Lab 08/28/20 1655  WBC 7.9  HGB 13.9  HCT 41.8  PLT 307  MCV 92.7  MCH 30.8  MCHC 33.3  RDW 15.1    Recent Labs  Lab  08/28/20 1655 08/29/20 0101 08/29/20 0555 08/29/20 0822  NA 133* 136 139 139  K 4.8 3.9 3.4* 3.6  CL 100 104 107 106  CO2 18* 22 25 24   GLUCOSE 620* 175* 159* 174*  BUN 17 14 13 13   CREATININE 2.22* 1.65* 1.53* 1.46*  CALCIUM 9.0 8.9 8.9 8.8*  AST 13*  --   --   --   ALT 13  --   --   --   ALKPHOS 75  --   --   --   BILITOT 0.9  --   --   --   ALBUMIN 3.4*  --   --   --   TSH  --  0.977  --   --     ------------------------------------------------------------------------------------------------------------------ Recent Labs    08/29/20 0101  CHOL 165  HDL 42  LDLCALC 102*  TRIG 103  CHOLHDL 3.9    Lab Results  Component Value Date   HGBA1C >15.5 (H) 12/31/2018   ------------------------------------------------------------------------------------------------------------------ Recent Labs    08/29/20 0101  TSH 0.977    Cardiac Enzymes No results for input(s): CKMB, TROPONINI, MYOGLOBIN in the last 168 hours.  Invalid input(s): CK ------------------------------------------------------------------------------------------------------------------ No results found for: BNP  Micro Results Recent Results (from the past 240 hour(s))  Resp Panel by RT-PCR (Flu A&B, Covid) Nasopharyngeal Swab     Status: None   Collection Time: 08/28/20 11:47 PM   Specimen: Nasopharyngeal Swab; Nasopharyngeal(NP) swabs in vial transport medium  Result Value Ref Range Status   SARS Coronavirus 2 by RT PCR NEGATIVE NEGATIVE Final    Comment: (NOTE) SARS-CoV-2 target nucleic acids are NOT DETECTED.  The SARS-CoV-2 RNA is generally detectable in upper respiratory specimens during the acute phase of infection. The lowest concentration of SARS-CoV-2 viral copies this assay can detect is 138 copies/mL. A negative result does not preclude SARS-Cov-2 infection and should not be used as the sole basis for treatment or other patient management decisions. A negative result may occur with   improper specimen collection/handling, submission of specimen other than nasopharyngeal swab, presence of viral mutation(s) within the areas targeted by this assay, and inadequate number of viral copies(<138 copies/mL). A negative result must be combined with clinical observations, patient history, and epidemiological information. The expected result is Negative.  Fact Sheet for Patients:  BloggerCourse.com  Fact Sheet for Healthcare Providers:  SeriousBroker.it  This test is no t yet approved or cleared by the [place] FDA and  has been authorized for detection and/or diagnosis of SARS-CoV-2 by FDA under an Emergency Use Authorization (EUA). This EUA will remain  in effect (meaning this test can be used) for the duration of the COVID-19 declaration under Section 564(b)(1) of the Act, 21 U.S.C.section 360bbb-3(b)(1), unless the authorization is terminated  or revoked sooner.       Influenza A by PCR NEGATIVE NEGATIVE Final   Influenza B by PCR NEGATIVE NEGATIVE Final    Comment: (NOTE) The Xpert Xpress SARS-CoV-2/FLU/RSV plus assay is intended as an aid in the diagnosis of influenza from Nasopharyngeal swab specimens and should not be used as a sole basis for treatment. Nasal washings and aspirates are unacceptable for Xpert Xpress SARS-CoV-2/FLU/RSV testing.  Fact Sheet for Patients: BloggerCourse.com  Fact Sheet for Healthcare Providers: SeriousBroker.it  This test is not yet approved or cleared by the [place] FDA and has been authorized for detection and/or diagnosis of SARS-CoV-2 by FDA under an Emergency Use Authorization (EUA). This EUA will remain in effect (meaning this test can be used) for the duration of the COVID-19 declaration under Section 564(b)(1) of the Act, 21 U.S.C. section 360bbb-3(b)(1), unless the authorization is terminated  or revoked.  Performed at [facility], [address] [place], [place] [address]     Radiology Reports CT HEAD WO CONTRAST  Result Date: 08/28/2020 CLINICAL DATA:  Neuro deficit EXAM: CT HEAD WITHOUT CONTRAST TECHNIQUE: Contiguous axial images were obtained from the base of the skull through the vertex without intravenous contrast. COMPARISON:  CT head dated 09/18/2005 FINDINGS: Brain: No evidence of acute infarction, hemorrhage, hydrocephalus, extra-axial collection or mass lesion/mass effect. Encephalomalacic changes related to old left PCA distribution infarct. Additional lacunar infarct in the left cerebellum. Subcortical white matter and periventricular small vessel ischemic changes. Vascular: Intracranial atherosclerosis. Skull: Normal. Negative for fracture or focal lesion. Sinuses/Orbits: The visualized paranasal sinuses are essentially clear. The mastoid air cells are unopacified. Other: None. IMPRESSION: No evidence of acute intracranial abnormality. Old left PCA distribution infarct. Old left cerebellar infarct. Small vessel ischemic changes. Electronically Signed   By: Charline Bills M.D.   On: 08/28/2020 22:47   DG Chest Port 1 View  Result Date: 08/28/2020 CLINICAL DATA:  Portable CXR for right side chest pain. Pt is non compliant with medicine. See ED Notes: Patient reports right side pain from his right shoulder to his right lower legs. History of a stroke in 2007, right side was the affected sidepain EXAM: PORTABLE CHEST 1 VIEW COMPARISON:  12/31/2018 FINDINGS: Normal mediastinum and cardiac silhouette. Normal pulmonary vasculature. No evidence of effusion, infiltrate, or pneumothorax. No acute bony abnormality. IMPRESSION: No acute cardiopulmonary process. Electronically Signed   By: Genevive Bi M.D.   On: 08/28/2020 17:19   CT Renal Stone Study  Result Date: 08/28/2020 CLINICAL DATA:  Right flank pain EXAM: CT ABDOMEN AND PELVIS WITHOUT CONTRAST TECHNIQUE:  Multidetector CT imaging of the abdomen and pelvis was performed following the standard protocol without IV contrast. COMPARISON:  02/13/2013 FINDINGS: Lower chest: There is slightly nodular peripheral parenchymal subpleural scarring, best appreciated on sagittal reformat # 53 within the visualized right lower lobe. The nodular component measures 12 mm x 23 mm at axial image # 5/5 and is indeterminate. While this most likely represents masslike scarring, a underlying pulmonary nodule is difficult to exclude on  this examination alone. This appears new since prior examination. The lung bases are otherwise clear. Mild coronary artery calcification. Global cardiac size within normal limits. Hepatobiliary: No focal liver abnormality is seen. No gallstones, gallbladder wall thickening, or biliary dilatation. Pancreas: Unremarkable Spleen: Unremarkable Adrenals/Urinary Tract: The adrenal glands are unremarkable. The kidneys are normal in position. There is marked atrophy and cortical scarring of the right kidney. The left kidney is normal in size. No pelvicaliceal or ureteral calculi. No hydronephrosis. The bladder is unremarkable. Stomach/Bowel: Mild sigmoid diverticulosis. The stomach, small bowel, and large bowel are otherwise unremarkable. Appendix normal. No free intraperitoneal gas or fluid. Vascular/Lymphatic: Mild aortoiliac atherosclerotic calcification. No aortic aneurysm. No pathologic adenopathy within the abdomen and pelvis. Reproductive: Prostate is unremarkable. Other: No abdominal wall hernia.  The rectum is unremarkable. Musculoskeletal: No acute bone abnormality. IMPRESSION: No acute intra-abdominal pathology identified. No definite radiographic explanation for the patient's reported symptoms. Interval development of nodular subpleural parenchymal scarring within the right lower lobe, not optimally characterized on this examination. A dedicated CT examination in 3 months would be helpful in documenting  both stability as well as further characterizing this finding. Marked atrophy and cortical scarring of the right kidney, new since prior examination. Aortic Atherosclerosis (ICD10-I70.0).  Fall Electronically Signed   By: Helyn Numbers MD   On: 08/28/2020 19:15

## 2020-08-29 NOTE — TOC Progression Note (Signed)
Transition of Care [facility]) - Progression Note    Patient Details  Name: [name] MRN: [id number] Date of Birth: October 23, 1950  Transition of [facility]) CM/SW Contact  Ina Homes, [place] Phone Number: 08/29/2020, 12:32 PM  Clinical Narrative:     SW informed by bedside RN, pt's son Margretta Sidle requesting to speak.  SW met with pt and pt's son Rasheen at bedside. Pt's son had questions regarding getting additional care for pt at home. SW explained that PCS is not covered by most insurances and likely will have to private pay. SW also explained pt has the possibility of getting HH but would have to be established with a PCP and [facility] services are limited to few visits weekly.   SW provided handout of above information.   Rasheen reports pt's SO Lelan Pons, will transport at d/c.   Expected Discharge Plan: Home/Self Care    Expected Discharge Plan and Services Expected Discharge Plan: Home/Self Care                                               Social Determinants of Health (SDOH) Interventions    Readmission Risk Interventions No flowsheet data found.

## 2020-08-29 NOTE — Progress Notes (Signed)
Inpatient Diabetes Program Recommendations  AACE/ADA: New Consensus Statement on Inpatient Glycemic Control (2015)  Target Ranges:  Prepandial:   less than 140 mg/dL      Peak postprandial:   less than 180 mg/dL (1-2 hours)      Critically ill patients:  140 - 180 mg/dL   Lab Results  Component Value Date   GLUCAP 219 (H) 08/29/2020   HGBA1C >15.5 (H) 12/31/2018    Review of Glycemic Control  Diabetes history: DM2 Outpatient Diabetes medications: Lantus 30 QD, Novolog 0-15 TID with meals, Tradjenta 5 mg QD (stopped taking med in March 2022) Current orders for Inpatient glycemic control: Lantus 35 units QD, Novolog 0-15 units TID with meals and 0-5 HS + 5 units TID with meals  HgbA1C >15.5%  Inpatient Diabetes Program Recommendations:    Lantus increased to 35 units QD. Agree with orders.  Diabetes Coordinator spoke with pt on 01/02/19 about HgbA1C of > 15.5%. Pt stated at that time he just stopped taking his meds. Pt was not interested in seeing an Endo.  Will speak with pt on 7/18 about his current HgbA1C and importance of good control at home.   Follow glucose trends.   Thank you. Ailene Ards, RD, LDN, CDE Inpatient Diabetes Coordinator [phone]

## 2020-08-29 NOTE — Plan of Care (Signed)

## 2020-08-30 DIAGNOSIS — R739 Hyperglycemia, unspecified: Secondary | ICD-10-CM | POA: Diagnosis not present

## 2020-08-30 DIAGNOSIS — E0810 Diabetes mellitus due to underlying condition with ketoacidosis without coma: Secondary | ICD-10-CM | POA: Diagnosis not present

## 2020-08-30 LAB — BASIC METABOLIC PANEL
Anion gap: 7 (ref 5–15)
BUN: 10 mg/dL (ref 8–23)
CO2: 24 mmol/L (ref 22–32)
Calcium: 8.7 mg/dL — ABNORMAL LOW (ref 8.9–10.3)
Chloride: 107 mmol/L (ref 98–111)
Creatinine, Ser: 1.56 mg/dL — ABNORMAL HIGH (ref 0.61–1.24)
GFR, Estimated: 47 mL/min — ABNORMAL LOW (ref >60)
Glucose, Bld: 265 mg/dL — ABNORMAL HIGH (ref 70–99)
Potassium: 4 mmol/L (ref 3.5–5.1)
Sodium: 138 mmol/L (ref 135–145)

## 2020-08-30 LAB — GLUCOSE, CAPILLARY
Glucose-Capillary: 253 mg/dL — ABNORMAL HIGH (ref 70–99)
Glucose-Capillary: 169 mg/dL — ABNORMAL HIGH (ref 70–99)
Glucose-Capillary: 286 mg/dL — ABNORMAL HIGH (ref 70–99)
Glucose-Capillary: 236 mg/dL — ABNORMAL HIGH (ref 70–99)
Glucose-Capillary: 203 mg/dL — ABNORMAL HIGH (ref 70–99)

## 2020-08-30 LAB — HEMOGLOBIN A1C
Hgb A1c MFr Bld: >15.5 % — ABNORMAL HIGH (ref 4.8–5.6)
Mean Plasma Glucose: >398 mg/dL

## 2020-08-30 LAB — MAGNESIUM: Magnesium: 2.1 mg/dL (ref 1.7–2.4)

## 2020-08-30 MED ORDER — INSULIN GLARGINE 100 UNIT/ML ~~LOC~~ SOLN
40.0000 [IU] | Freq: Every day | SUBCUTANEOUS | Status: DC
  Administered 2020-08-30 – 2020-08-31 (×2): 40 [IU] via SUBCUTANEOUS
  Filled 2020-08-30 (×2): qty 0.4

## 2020-08-30 NOTE — Progress Notes (Addendum)
Inpatient Diabetes Program Recommendations  AACE/ADA: New Consensus Statement on Inpatient Glycemic Control (2015)  Target Ranges:  Prepandial:   less than 140 mg/dL      Peak postprandial:   less than 180 mg/dL (1-2 hours)      Critically ill patients:  140 - 180 mg/dL  Results for [name], [name] (MRN [id number]) as of 08/30/2020 07:36  Ref. Range 08/28/2020 16:55  Glucose Latest Ref Range: 70 - 99 mg/dL [facility])   Results for [name], [name] (MRN [id number]) as of 08/30/2020 07:36  Ref. Range 08/29/2020 07:31 08/29/2020 08:33 08/29/2020 11:15 08/29/2020 16:25 08/29/2020 20:35  Glucose-Capillary Latest Ref Range: 70 - 99 mg/dL 156 (H)  IV Insulin Drip 177 (H)  35 units Lantus _0 :10  IV Insulin Drip Stopped 8:56 371 (H)  20 units NOVOLOG  326 (H)  16 units NOVOLOG  219 (H)  2 units NOVOLOG    Results for [name], [name] (MRN [id number]) as of 08/30/2020 08:48  Ref. Range 08/30/2020 08:11  Glucose-Capillary Latest Ref Range: 70 - 99 mg/dL 253 (H)   Admit with: Hyperglycemia/ Right sided paresthesia and pain w/hx of CVA  History: DM, CKD, CVA  Home DM Meds: Lantus 30 units Daily        Novolog 0-15 TID with meals        Tradjenta 5 mg Daily        Stopped Home meds in March 2022  Current Orders: Novolog Moderate Correction Scale/ SSI (0-15 units) TID AC + HS     Novolog 5 units TID with meals     Lantus 35 units Daily    Last A1c on file was >15.5% back in November 2020 Current A1c pending--Expect it to be severely elevated  PCP listed as Dr. Doylene Canard   MD- Note CBG 253 this AM  Please consider:  1. Increase Lantus to 40 units Daily If 35 unit dose already given this AM, please give an extra 5 unit Lantus X 1 dose as well  2. Increase Novolog Meal Coverage to 8 units TID with meals    Addendum 12:40pm--Met w/ pt at bedside.  Pt told me he stopped taking his Insulin and other oral meds at home back in March of this year.  Told me he was sick and tired of  checking CBGs and giving injections and decided to try diet and exercise changes to see if he could come off his meds permanently.  Told me he has been having Hyperglycemia symptoms and has been feeling poorly.  Stated to me that he knows he needs to restart his insulin and is willing to do so.  Has CBG meter at home.  Knows how to self-inject with insulin pens and is comfortable restarting when he goes home.  Reminded pt to rotate injection sites and verbally reviewed use of insulin pens with pt.  Reviewed Basic pathophysiology of DM Type 2, basic home care, basic diabetes diet nutrition principles, importance of checking CBGs and maintaining good CBG control to prevent long-term and short-term complications.  Reviewed signs and symptoms of hyperglycemia and hypoglycemia and how to treat hypoglycemia at home.  Also reviewed blood sugar goals and A1c goals for home.    Discussed with pt that given his A1c is >15%, he has to have insulin at home.  Pt open and willing to restart.  Also discussed DM diet information with patient.  Encouraged patient to avoid beverages with sugar (regular soda, sweet tea, lemonade, fruit juice) and to  consume mostly water.  Discussed what foods contain carbohydrates and how carbohydrates affect the body's blood sugar levels.  Encouraged patient to be careful with his portion sizes (especially grains, starchy vegetables, and fruits).      --Will follow patient during hospitalization--  Wyn Quaker RN, MSN, CDE Diabetes Coordinator Inpatient Glycemic Control Team Team Pager: [phone] (8a-5p)

## 2020-08-30 NOTE — Progress Notes (Signed)
PROGRESS NOTE                                                                                                                                                                                                             Patient Demographics:    [name], is a 70 y.o. male, DOB - Jul 25, 1950, [id number]  Outpatient Primary MD for the patient is Orpah Cobb, MD    LOS - 1  Admit date - 08/28/2020    Chief Complaint  Patient presents with   right side pain       Brief Narrative (HPI from H&P) - [name] is a 70 y.o. male with medical history significant for CVA with residual right-sided weakness and paresthesia, history of MI, hypertension, CKD stage 2, , type 2 diabetes, hyperlipidemia, obesity and tobacco use who presents with worsening decided pain, he apparently stopped taking his insulin, blood pressure medications and Neurontin few months ago for no good reason, he says that he has chronic right-sided pain which got worse over the last few days.  In the ER he was found to have blood sugar in 700s with nonketotic hyperosmolar state and he was admitted to the hospital   Subjective:   Patient in bed, appears comfortable, denies any headache, no fever, no chest pain or pressure, no shortness of breath , no abdominal pain. No new focal weakness.   Assessment  & Plan :     NKH in DM2 - due to noncompliance with medications, strictly counseled on complaince, has been placed on Lantus, Premeal NovoLog and sliding scale, dose adjusted.  Will receive diabetic and insulin education.  Titrated off insulin drip & IV fluids for hydration.   Lab Results  Component Value Date   HGBA1C >15.5 (H) 12/31/2018   CBG (last 3)  Recent Labs    08/29/20 1625 08/29/20 2035 08/30/20 0811  GLUCAP 326* 219* 253*    2. HTN - placed on combination of Norvasc, Imdur and Coreg.  Monitor and adjust.  3.  History of CVA with  residual right-sided weakness and neuropathic pain.  Replaced on aspirin, statin and Neurontin.  PT-OT.  4.  Dyslipidemia.  Placed on statin.  5.  BPH.  Placed on Flomax.  6.  GERD.  Placed on PPI.  7.  Dehydration, hypernatremia and AKI.  Due to #  1 above.  Resolved after IVF and K replacement.   Obesity: Follow with PCP Estimated body mass index is 31.91 kg/m as calculated from the following:   Height as of this encounter:  (1.905 m).   Weight as of this encounter: 115.8 kg.          Condition - Fair  Family Communication  :  None present  Code Status :  Full  Consults  :  None  PUD Prophylaxis : PPI   Procedures  :            Disposition Plan  :    Status is: Observation  Dispo: The patient is from: Home              Anticipated d/c is to: Home              Patient currently is not medically stable to d/c.   Difficult to place patient No  DVT Prophylaxis  :    enoxaparin (LOVENOX) injection 40 mg Start: 08/28/20 2115  Lab Results  Component Value Date   PLT 307 08/28/2020    Diet :  Diet Order             Diet heart healthy/carb modified Room service appropriate? Yes; Fluid consistency: Thin  Diet effective now                    Inpatient Medications  Scheduled Meds:  amLODipine  10 mg Oral Daily   aspirin EC  81 mg Oral Daily   atorvastatin  40 mg Oral Daily   carvedilol  3.125 mg Oral BID WC   enoxaparin (LOVENOX) injection  40 mg Subcutaneous Q24H   gabapentin  200 mg Oral TID   insulin aspart  0-15 Units Subcutaneous TID WC   insulin aspart  0-5 Units Subcutaneous QHS   insulin aspart  5 Units Subcutaneous TID WC   insulin glargine  40 Units Subcutaneous Daily   isosorbide mononitrate  30 mg Oral Daily   pantoprazole  40 mg Oral Daily   tamsulosin  0.4 mg Oral QPC supper   Continuous Infusions:  cefTRIAXone (ROCEPHIN)  IV Stopped (08/29/20 1020)   PRN Meds:.acetaminophen, dextrose, ondansetron (ZOFRAN)  IV  Antibiotics  :    Anti-infectives (From admission, onward)    Start     Dose/Rate Route Frequency Ordered Stop   08/29/20 1000  cefTRIAXone (ROCEPHIN) 1 g in sodium chloride 0.9 % 100 mL IVPB        1 g 200 mL/hr over 30 Minutes Intravenous Every 24 hours 08/28/20 2107     08/28/20 1900  cefTRIAXone (ROCEPHIN) 1 g in sodium chloride 0.9 % 100 mL IVPB        1 g 200 mL/hr over 30 Minutes Intravenous  Once 08/28/20 1846 08/28/20 2017        Time Spent in minutes  30   Susa Raring M.D on 08/30/2020 at 9:50 AM  To page go to [email or web address]   [facility] -  Office  [phone]   See all Orders from today for further details    Objective:   Vitals:   08/29/20 2009 08/29/20 2342 08/30/20 0500 08/30/20 0812  BP: 123/80 122/64 135/76 126/87  Pulse: 80 72 74 86  Resp: Temp: 98.7 F (37.1 C) 98.2 F (36.8 C) 98.2 F (36.8 C) 98.2 F (36.8 C)  TempSrc: Oral Oral Oral Oral  SpO2: 100% 99% 99% 100%  Weight:   115.8 kg   Height:        Wt Readings from Last 3 Encounters:  08/30/20 115.8 kg  01/03/19 110.1 kg  01/23/18 131.5 kg     Intake/Output Summary (Last 24 hours) at 08/30/2020 0950 Last data filed at 08/30/2020 0200 Gross per 24 hour  Intake 1512.92 ml  Output 200 ml  Net 1312.92 ml     Physical Exam  Awake Alert, No new F.N deficits, chronic right-sided weakness strength 4/5. [place].AT,PERRAL Supple Neck,No JVD, No cervical lymphadenopathy appriciated.  Symmetrical Chest wall movement, Good air movement bilaterally, CTAB RRR,No Gallops, Rubs or new Murmurs, No Parasternal Heave +ve B.Sounds, Abd Soft, No tenderness, No organomegaly appriciated, No rebound - guarding or rigidity. No Cyanosis, Clubbing or edema, No new Rash or bruise    Data Review:    CBC Recent Labs  Lab 08/28/20 1655  WBC 7.9  HGB 13.9  HCT 41.8  PLT 307  MCV 92.7  MCH 30.8  MCHC 33.3  RDW 15.1    Recent Labs  Lab 08/28/20 1655 08/29/20 0101  08/29/20 0555 08/29/20 0822 08/30/20 0803  NA 133* 136 139 139 138  K 4.8 3.9 3.4* 3.6 4.0  CL 100 104 107 106 107  CO2 18* 22 25 24 24   GLUCOSE 620* 175* 159* 174* 265*  BUN 17 14 13 13 10   CREATININE 2.22* 1.65* 1.53* 1.46* 1.56*  CALCIUM 9.0 8.9 8.9 8.8* 8.7*  AST 13*  --   --   --   --   ALT 13  --   --   --   --   ALKPHOS 75  --   --   --   --   BILITOT 0.9  --   --   --   --   ALBUMIN 3.4*  --   --   --   --   MG  --   --   --   --  2.1  TSH  --  0.977  --   --   --     ------------------------------------------------------------------------------------------------------------------ Recent Labs    08/29/20 0101  CHOL 165  HDL 42  LDLCALC 102*  TRIG 103  CHOLHDL 3.9    Lab Results  Component Value Date   HGBA1C >15.5 (H) 12/31/2018   ------------------------------------------------------------------------------------------------------------------ Recent Labs    08/29/20 0101  TSH 0.977    Cardiac Enzymes No results for input(s): CKMB, TROPONINI, MYOGLOBIN in the last 168 hours.  Invalid input(s): CK ------------------------------------------------------------------------------------------------------------------ No results found for: BNP  Micro Results Recent Results (from the past 240 hour(s))  Urine Culture     Status: Abnormal   Collection Time: 08/28/20  6:46 PM   Specimen: Urine, Clean Catch  Result Value Ref Range Status   Specimen Description URINE, CLEAN CATCH  Final   Special Requests NONE  Final   Culture (A)  Final    30,000 COLONIES/mL DIPHTHEROIDS(CORYNEBACTERIUM SPECIES) Standardized susceptibility testing for this organism is not available. Performed at [facility], [address] [place], [place] [address]    Report Status 08/29/2020 FINAL  Final  Resp Panel by RT-PCR (Flu A&B, Covid) Nasopharyngeal Swab     Status: None   Collection Time: 08/28/20 11:47 PM   Specimen: Nasopharyngeal Swab; Nasopharyngeal(NP) swabs in vial  transport medium  Result Value Ref Range Status   SARS Coronavirus 2 by RT PCR NEGATIVE NEGATIVE Final    Comment: (NOTE) SARS-CoV-2 target nucleic acids are NOT DETECTED.  The SARS-CoV-2 RNA is generally detectable in upper respiratory specimens during the acute phase of infection. The lowest concentration of SARS-CoV-2 viral copies this assay can detect is 138 copies/mL. A negative result does not preclude SARS-Cov-2 infection and should not be used as the sole basis for treatment or other patient management decisions. A negative result may occur with  improper specimen collection/handling, submission of specimen other than nasopharyngeal swab, presence of viral mutation(s) within the areas targeted by this assay, and inadequate number of viral copies(<138 copies/mL). A negative result must be combined with clinical observations, patient history, and epidemiological information. The expected result is Negative.  Fact Sheet for Patients:  BloggerCourse.com  Fact Sheet for Healthcare Providers:  SeriousBroker.it  This test is no t yet approved or cleared by the [place] FDA and  has been authorized for detection and/or diagnosis of SARS-CoV-2 by FDA under an Emergency Use Authorization (EUA). This EUA will remain  in effect (meaning this test can be used) for the duration of the COVID-19 declaration under Section 564(b)(1) of the Act, 21 U.S.C.section 360bbb-3(b)(1), unless the authorization is terminated  or revoked sooner.       Influenza A by PCR NEGATIVE NEGATIVE Final   Influenza B by PCR NEGATIVE NEGATIVE Final    Comment: (NOTE) The Xpert Xpress SARS-CoV-2/FLU/RSV plus assay is intended as an aid in the diagnosis of influenza from Nasopharyngeal swab specimens and should not be used as a sole basis for treatment. Nasal washings and aspirates are unacceptable for Xpert Xpress SARS-CoV-2/FLU/RSV testing.  Fact  Sheet for Patients: BloggerCourse.com  Fact Sheet for Healthcare Providers: SeriousBroker.it  This test is not yet approved or cleared by the [place] FDA and has been authorized for detection and/or diagnosis of SARS-CoV-2 by FDA under an Emergency Use Authorization (EUA). This EUA will remain in effect (meaning this test can be used) for the duration of the COVID-19 declaration under Section 564(b)(1) of the Act, 21 U.S.C. section 360bbb-3(b)(1), unless the authorization is terminated or revoked.  Performed at [facility], [address] [place], [place] [address]     Radiology Reports CT HEAD WO CONTRAST  Result Date: 08/28/2020 CLINICAL DATA:  Neuro deficit EXAM: CT HEAD WITHOUT CONTRAST TECHNIQUE: Contiguous axial images were obtained from the base of the skull through the vertex without intravenous contrast. COMPARISON:  CT head dated 09/18/2005 FINDINGS: Brain: No evidence of acute infarction, hemorrhage, hydrocephalus, extra-axial collection or mass lesion/mass effect. Encephalomalacic changes related to old left PCA distribution infarct. Additional lacunar infarct in the left cerebellum. Subcortical white matter and periventricular small vessel ischemic changes. Vascular: Intracranial atherosclerosis. Skull: Normal. Negative for fracture or focal lesion. Sinuses/Orbits: The visualized paranasal sinuses are essentially clear. The mastoid air cells are unopacified. Other: None. IMPRESSION: No evidence of acute intracranial abnormality. Old left PCA distribution infarct. Old left cerebellar infarct. Small vessel ischemic changes. Electronically Signed   By: Charline Bills M.D.   On: 08/28/2020 22:47   DG Chest Port 1 View  Result Date: 08/28/2020 CLINICAL DATA:  Portable CXR for right side chest pain. Pt is non compliant with medicine. See ED Notes: Patient reports right side pain from his right shoulder to his right  lower legs. History of a stroke in 2007, right side was the affected sidepain EXAM: PORTABLE CHEST 1 VIEW COMPARISON:  12/31/2018 FINDINGS: Normal mediastinum and cardiac silhouette. Normal pulmonary vasculature. No evidence of effusion, infiltrate, or pneumothorax. No acute bony abnormality. IMPRESSION: No acute cardiopulmonary process. Electronically  Signed   By: Genevive Bi M.D.   On: 08/28/2020 17:19   CT Renal Stone Study  Result Date: 08/28/2020 CLINICAL DATA:  Right flank pain EXAM: CT ABDOMEN AND PELVIS WITHOUT CONTRAST TECHNIQUE: Multidetector CT imaging of the abdomen and pelvis was performed following the standard protocol without IV contrast. COMPARISON:  02/13/2013 FINDINGS: Lower chest: There is slightly nodular peripheral parenchymal subpleural scarring, best appreciated on sagittal reformat # 53 within the visualized right lower lobe. The nodular component measures 12 mm x 23 mm at axial image # 5/5 and is indeterminate. While this most likely represents masslike scarring, a underlying pulmonary nodule is difficult to exclude on this examination alone. This appears new since prior examination. The lung bases are otherwise clear. Mild coronary artery calcification. Global cardiac size within normal limits. Hepatobiliary: No focal liver abnormality is seen. No gallstones, gallbladder wall thickening, or biliary dilatation. Pancreas: Unremarkable Spleen: Unremarkable Adrenals/Urinary Tract: The adrenal glands are unremarkable. The kidneys are normal in position. There is marked atrophy and cortical scarring of the right kidney. The left kidney is normal in size. No pelvicaliceal or ureteral calculi. No hydronephrosis. The bladder is unremarkable. Stomach/Bowel: Mild sigmoid diverticulosis. The stomach, small bowel, and large bowel are otherwise unremarkable. Appendix normal. No free intraperitoneal gas or fluid. Vascular/Lymphatic: Mild aortoiliac atherosclerotic calcification. No aortic  aneurysm. No pathologic adenopathy within the abdomen and pelvis. Reproductive: Prostate is unremarkable. Other: No abdominal wall hernia.  The rectum is unremarkable. Musculoskeletal: No acute bone abnormality. IMPRESSION: No acute intra-abdominal pathology identified. No definite radiographic explanation for the patient's reported symptoms. Interval development of nodular subpleural parenchymal scarring within the right lower lobe, not optimally characterized on this examination. A dedicated CT examination in 3 months would be helpful in documenting both stability as well as further characterizing this finding. Marked atrophy and cortical scarring of the right kidney, new since prior examination. Aortic Atherosclerosis (ICD10-I70.0).  Fall Electronically Signed   By: Helyn Numbers MD   On: 08/28/2020 19:15

## 2020-08-31 ENCOUNTER — Other Ambulatory Visit (HOSPITAL_COMMUNITY): Payer: Self-pay

## 2020-08-31 DIAGNOSIS — E860 Dehydration: Secondary | ICD-10-CM

## 2020-08-31 DIAGNOSIS — R739 Hyperglycemia, unspecified: Secondary | ICD-10-CM | POA: Diagnosis not present

## 2020-08-31 DIAGNOSIS — N179 Acute kidney failure, unspecified: Secondary | ICD-10-CM | POA: Diagnosis not present

## 2020-08-31 DIAGNOSIS — N182 Chronic kidney disease, stage 2 (mild): Secondary | ICD-10-CM | POA: Diagnosis not present

## 2020-08-31 LAB — CBC WITH DIFFERENTIAL/PLATELET
Abs Immature Granulocytes: 0.1 10*3/uL — ABNORMAL HIGH (ref 0.00–0.07)
Basophils Absolute: 0 10*3/uL (ref 0.0–0.1)
Basophils Relative: 1 %
Eosinophils Absolute: 0.1 10*3/uL (ref 0.0–0.5)
Eosinophils Relative: 1 %
HCT: 38 % — ABNORMAL LOW (ref 39.0–52.0)
Hemoglobin: 12.5 g/dL — ABNORMAL LOW (ref 13.0–17.0)
Immature Granulocytes: 1 %
Lymphocytes Relative: 28 %
Lymphs Abs: 2.2 10*3/uL (ref 0.7–4.0)
MCH: 30.6 pg (ref 26.0–34.0)
MCHC: 32.9 g/dL (ref 30.0–36.0)
MCV: 92.9 fL (ref 80.0–100.0)
Monocytes Absolute: 1 10*3/uL (ref 0.1–1.0)
Monocytes Relative: 12 %
Neutro Abs: 4.4 10*3/uL (ref 1.7–7.7)
Neutrophils Relative %: 57 %
Platelets: 283 10*3/uL (ref 150–400)
RBC: 4.09 MIL/uL — ABNORMAL LOW (ref 4.22–5.81)
RDW: 15.1 % (ref 11.5–15.5)
WBC: 7.7 10*3/uL (ref 4.0–10.5)
nRBC: 0 % (ref 0.0–0.2)

## 2020-08-31 LAB — COMPREHENSIVE METABOLIC PANEL
ALT: 13 U/L (ref 0–44)
AST: 16 U/L (ref 15–41)
Albumin: 2.9 g/dL — ABNORMAL LOW (ref 3.5–5.0)
Alkaline Phosphatase: 59 U/L (ref 38–126)
Anion gap: 5 (ref 5–15)
BUN: 11 mg/dL (ref 8–23)
CO2: 26 mmol/L (ref 22–32)
Calcium: 8.9 mg/dL (ref 8.9–10.3)
Chloride: 108 mmol/L (ref 98–111)
Creatinine, Ser: 1.52 mg/dL — ABNORMAL HIGH (ref 0.61–1.24)
GFR, Estimated: 48 mL/min — ABNORMAL LOW (ref >60)
Glucose, Bld: 175 mg/dL — ABNORMAL HIGH (ref 70–99)
Potassium: 4 mmol/L (ref 3.5–5.1)
Sodium: 139 mmol/L (ref 135–145)
Total Bilirubin: 0.5 mg/dL (ref 0.3–1.2)
Total Protein: 6.2 g/dL — ABNORMAL LOW (ref 6.5–8.1)

## 2020-08-31 LAB — GLUCOSE, CAPILLARY: Glucose-Capillary: 210 mg/dL — ABNORMAL HIGH (ref 70–99)

## 2020-08-31 LAB — MAGNESIUM: Magnesium: 2 mg/dL (ref 1.7–2.4)

## 2020-08-31 MED ORDER — GABAPENTIN 100 MG PO CAPS
100.0000 mg | ORAL_CAPSULE | Freq: Three times a day (TID) | ORAL | 0 refills | Status: DC
  Filled 2020-08-31: qty 90, 30d supply, fill #0

## 2020-08-31 MED ORDER — TAMSULOSIN HCL 0.4 MG PO CAPS
0.4000 mg | ORAL_CAPSULE | Freq: Every day | ORAL | 0 refills | Status: AC
  Filled 2020-08-31: qty 30, 30d supply, fill #0

## 2020-08-31 MED ORDER — INSULIN PEN NEEDLE 32G X 4 MM MISC
0 refills | Status: DC
  Filled 2020-08-31: qty 100, 25d supply, fill #0

## 2020-08-31 MED ORDER — CHOLECALCIFEROL 25 MCG (1000 UT) PO TABS
1000.0000 [IU] | ORAL_TABLET | Freq: Every day | ORAL | 0 refills | Status: DC
  Filled 2020-08-31: qty 30, 30d supply, fill #0

## 2020-08-31 MED ORDER — CARVEDILOL 3.125 MG PO TABS
3.1250 mg | ORAL_TABLET | Freq: Two times a day (BID) | ORAL | 0 refills | Status: DC
  Filled 2020-08-31: qty 60, 30d supply, fill #0

## 2020-08-31 MED ORDER — AMLODIPINE BESYLATE 10 MG PO TABS
10.0000 mg | ORAL_TABLET | Freq: Every day | ORAL | 0 refills | Status: DC
  Filled 2020-08-31: qty 30, 30d supply, fill #0

## 2020-08-31 MED ORDER — INSULIN DETEMIR 100 UNIT/ML FLEXPEN
40.0000 [IU] | PEN_INJECTOR | Freq: Every day | SUBCUTANEOUS | 0 refills | Status: DC
  Filled 2020-08-31: qty 12, 30d supply, fill #0

## 2020-08-31 MED ORDER — ASPIRIN 81 MG PO TBEC
81.0000 mg | DELAYED_RELEASE_TABLET | Freq: Every day | ORAL | 0 refills | Status: AC
  Filled 2020-08-31: qty 30, 30d supply, fill #0

## 2020-08-31 MED ORDER — INSULIN ASPART 100 UNIT/ML FLEXPEN
PEN_INJECTOR | SUBCUTANEOUS | 0 refills | Status: DC
  Filled 2020-08-31: qty 15, 25d supply, fill #0

## 2020-08-31 MED ORDER — ATORVASTATIN CALCIUM 40 MG PO TABS
40.0000 mg | ORAL_TABLET | Freq: Every day | ORAL | 0 refills | Status: AC
  Filled 2020-08-31: qty 30, 30d supply, fill #0

## 2020-08-31 MED ORDER — ISOSORBIDE MONONITRATE ER 30 MG PO TB24
30.0000 mg | ORAL_TABLET | Freq: Every day | ORAL | 0 refills | Status: DC
  Filled 2020-08-31: qty 30, 30d supply, fill #0

## 2020-08-31 NOTE — Care Management (Signed)
08-31-20 1131 Case Manager spoke with the patient this morning and the patient declines a bedside commode at this time. Case Manager spoke with the patients son on yesterday and he is working to get personal care services in the home. No further needs from Case Manager at this time.

## 2020-08-31 NOTE — Evaluation (Signed)
Physical Therapy Evaluation & Discharge Patient Details Name: [name] MRN: [id number] DOB: 04/07/1950 Today's Date: 08/31/2020   History of Present Illness  Pt is a 70 y.o. male admitted 08/28/20 with worsening R-side pain; in ED, found to be hyperglycemic. Workup for nonketotic hypersmolar state due to noncompliance with medications. PMH includes DM2, CVA (residual R-side weakness & paresthesia), HTN, CKD2, MI, obesity, tobacco use.   Clinical Impression  Patient evaluated by Physical Therapy with no further acute PT needs identified. PTA, pt lives alone, independent with mobility, drives. Today, pt mod indep with transfers and ambulation; pt with baseline instability, but no overt LOB; pt declined gait training with SPC, but does request one to have at home. All education has been completed and the patient has no further questions. Acute PT is signing off. Thank you for this referral.  SpO2 96% on RA, HR 103 with ambulation    Follow Up Recommendations No PT follow up    Equipment Recommendations  3in1 (PT);Cane    Recommendations for Other Services       Precautions / Restrictions Precautions Precautions: Fall;Other (comment) Precaution Comments: Prior CVA (2007) with residual R-side weakness Restrictions Weight Bearing Restrictions: No      Mobility  Bed Mobility Overal bed mobility: Modified Independent             General bed mobility comments: Received sitting in recliner    Transfers Overall transfer level: Independent Equipment used: None             General transfer comment: mod I for sit<>stand, pt may benefit from a cane in standing for ambulation  Ambulation/Gait Ambulation/Gait assistance: Modified independent (Device/Increase time) Gait Distance (Feet): 200 Feet Assistive device: None Gait Pattern/deviations: Step-through pattern;Decreased stride length;Trunk flexed;Drifts right/left Gait velocity: Decreased   General Gait Details:  Slow, mostly steady gait without DME, mod indep for increased time; pt with no LOB with higher level balance tasks, at times reaching to furniture/hallway rail for UE support; pt declined gait training with [facility]  Stairs            Wheelchair Mobility    Modified Rankin (Stroke Patients Only)       Balance Overall balance assessment: Needs assistance   Sitting balance-Leahy Scale: Good     Standing balance support: No upper extremity supported;During functional activity Standing balance-Leahy Scale: Good               High level balance activites: Side stepping;Backward walking;Direction changes;Turns;Sudden stops;Head turns High Level Balance Comments: Some instability, but no LOB with higher level balance tasks             Pertinent Vitals/Pain Pain Assessment: Faces Pain Score: 5  Faces Pain Scale: Hurts a little bit Pain Location: generalized R side Pain Descriptors / Indicators: Discomfort Pain Intervention(s): Monitored during session    Home Living Family/patient expects to be discharged to:: Private residence Living Arrangements: Alone Available Help at Discharge: Friend(s);Available PRN/intermittently Type of Home: Apartment Home Access: Level entry     Home Layout: One level Home Equipment: None      Prior Function Level of Independence: Independent         Comments: Independent without DME, drives     Hand Dominance   Dominant Hand: Right    Extremity/Trunk Assessment   Upper Extremity Assessment Upper Extremity Assessment: RUE deficits/detail RUE Deficits / Details: residual R sided weakness from CVA 2007; functionally >3/5 observed RUE:  (generalized 4/5 MMT) RUE Sensation: decreased  light touch RUE Coordination: decreased fine motor    Lower Extremity Assessment Lower Extremity Assessment: RLE deficits/detail RLE Deficits / Details: residual R sided weakness from CVA 2007; functionally >3/5 observed RLE Sensation:  decreased light touch    Cervical / Trunk Assessment Cervical / Trunk Assessment: Normal  Communication   Communication: No difficulties  Cognition Arousal/Alertness: Awake/alert Behavior During Therapy: WFL for tasks assessed/performed Overall Cognitive Status: Within Functional Limits for tasks assessed                                        General Comments General comments (skin integrity, edema, etc.): SpO2 96% on RA, HR 103 with ambulation. Pt initially declining DME use for home; MD present and suggested SPC, to which pt now agreeable to; pt declined formal gait training with [facility]    Exercises     Assessment/Plan    PT Assessment Patent does not need any further PT services  PT Problem List         PT Treatment Interventions      PT Goals (Current goals can be found in the Care Plan section)  Acute Rehab PT Goals Patient Stated Goal: home soon PT Goal Formulation: All assessment and education complete, DC therapy    Frequency     Barriers to discharge        Co-evaluation               AM-PAC PT "6 Clicks" Mobility  Outcome Measure Help needed turning from your back to your side while in a flat bed without using bedrails?: None Help needed moving from lying on your back to sitting on the side of a flat bed without using bedrails?: None Help needed moving to and from a bed to a chair (including a wheelchair)?: None Help needed standing up from a chair using your arms (e.g., wheelchair or bedside chair)?: None Help needed to walk in hospital room?: None Help needed climbing 3-5 steps with a railing? : A Little 6 Click Score: 23    End of Session   Activity Tolerance: Patient tolerated treatment well Patient left: in chair;with call bell/phone within reach;Other (comment) (with MD present)   PT Visit Diagnosis: Other abnormalities of gait and mobility (R26.89)    Time: 3734-2876 PT Time Calculation (min) (ACUTE ONLY): 12  min   Charges:   PT Evaluation $PT Eval Low Complexity: 1 Low         Ina Homes, PT, DPT Acute Rehabilitation Services  Pager [phone] Office [phone]  Malachy Chamber 08/31/2020, 9:06 AM

## 2020-08-31 NOTE — Progress Notes (Signed)
Pt is alert and oriented. Discharge instructions/ AVS given to pt. 

## 2020-08-31 NOTE — Discharge Instructions (Addendum)
Follow with Primary MD Orpah Cobb, MD in 7 days   Get CBC, CMP -  checked next visit within 1 week by Primary MD    Activity: As tolerated with Full fall precautions use walker/cane & assistance as needed  Disposition Home    Diet: Heart Healthy Low Carb  Accuchecks 4 times/day, Once in AM empty stomach and then before each meal. Log in all results and show them to your Prim.MD in 3 days. If any glucose reading is under 80 or above 300 call your Prim MD immidiately. Follow Low glucose instructions for glucose under 80 as instructed.   Special Instructions: If you have smoked or chewed Tobacco  in the last 2 yrs please stop smoking, stop any regular Alcohol  and or any Recreational drug use.  On your next visit with your primary care physician please Get Medicines reviewed and adjusted.  Please request your Prim.MD to go over all Hospital Tests and Procedure/Radiological results at the follow up, please get all Hospital records sent to your Prim MD by signing hospital release before you go home.  If you experience worsening of your admission symptoms, develop shortness of breath, life threatening emergency, suicidal or homicidal thoughts you must seek medical attention immediately by calling 911 or calling your MD immediately  if symptoms less severe.  You Must read complete instructions/literature along with all the possible adverse reactions/side effects for all the Medicines you take and that have been prescribed to you. Take any new Medicines after you have completely understood and accpet all the possible adverse reactions/side effects.

## 2020-08-31 NOTE — Discharge Summary (Signed)
[name] VHQ:[id number] DOB: [birth year] DOA: 08/28/2020  PCP: Orpah Cobb, MD  Admit date: 08/28/2020  Discharge date: 08/31/2020  Admitted From: Home  Disposition:  Home   Recommendations for Outpatient Follow-up:   Follow up with PCP in 1-2 weeks  PCP Please obtain BMP/CBC, 2 view CXR in 1week,  (see Discharge instructions)   PCP Please follow up on the following pending results: follow A1c, CBGs, BP, CBC, CMP, Lipid panel closely   Home Health: PT-RN   Equipment/Devices: Cane  Consultations: None  Discharge Condition: Stable    CODE STATUS: Full    Diet Recommendation: Heart Healthy Low Carb  Diet Order             Diet heart healthy/carb modified Room service appropriate? Yes; Fluid consistency: Thin  Diet effective now                    Chief Complaint  Patient presents with   right side pain     Brief history of present illness from the day of admission and additional interim summary    [name] is a 70 y.o. male with medical history significant for CVA with residual right-sided weakness and paresthesia, history of MI, hypertension, CKD stage 2, , type 2 diabetes, hyperlipidemia, obesity and tobacco use who presents with worsening decided pain, he apparently stopped taking his insulin, blood pressure medications and Neurontin few months ago for no good reason, he says that he has chronic right-sided pain which got worse over the last few days.  In the ER he was found to have blood sugar in 700s with nonketotic hyperosmolar state and he was admitted to the hospital.                                                                 Hospital Course     NKH in DM2 - received IV insulin and IV fluids initially, now stable, due to noncompliance with medications, strictly counseled on  complaince, has been placed on Lantus, Premeal NovoLog and sliding scale, dose adjusted.  Has received diabetic and insulin education, has testing supplies at home, comfortable going home and taking insulin and checking his CBGs 4 times a day, has testing supplies, requested to follow-up with PCP within a week with before every meal Accu-Cheks CBG testing results.  Titrated off insulin drip & IV fluids for hydration.   Lab Results  Component Value Date   HGBA1C >15.5 (H) 08/29/2020   CBG (last 3)  Recent Labs    08/30/20 1635 08/30/20 2124 08/31/20 0653  GLUCAP 236* 203* 210*    2. HTN - placed on combination of Norvasc, Imdur and Coreg.  Blood pressure much improved PCP to monitor and adjust.   3.  History of CVA with residual  right-sided weakness and neuropathic pain.  Replaced on aspirin, statin and Neurontin.  PT-OT.   4.  Dyslipidemia.  Placed on statin.   5.  BPH.  Placed on Flomax.   6.  GERD.  Placed on PPI.   7.  Dehydration, hypernatremia and AKI on underlying CKD 3B.  Due to #1 above.  Resolved after IVF and K replacement.  Baseline creatinine around 1.5 he is now at baseline.  Electrolytes stable.  Worked with PT OT, will order home PT and RN if he is agreeable, home cane ordered he refused home 3 and 1.     Discharge diagnosis     Active Problems:   Type 2 diabetes mellitus with hyperlipidemia (HCC)   HYPERTENSION, BENIGN ESSENTIAL   Hyperglycemia   History of CVA (cerebrovascular accident)   Paresthesia   Hypernatremia   Acute-on-chronic kidney injury (HCC)   Acute lower UTI   Pleural scarring    Discharge instructions    Discharge Instructions     Discharge instructions   Complete by: As directed    Follow with Primary MD Orpah CobbKadakia, Ajay, MD in 7 days   Get CBC, CMP -  checked next visit within 1 week by Primary MD    Activity: As tolerated with Full fall precautions use walker/cane & assistance as needed  Disposition Home    Diet: Heart Healthy  Low Carb  Accuchecks 4 times/day, Once in AM empty stomach and then before each meal. Log in all results and show them to your Prim.MD in 3 days. If any glucose reading is under 80 or above 300 call your Prim MD immidiately. Follow Low glucose instructions for glucose under 80 as instructed.   Special Instructions: If you have smoked or chewed Tobacco  in the last 2 yrs please stop smoking, stop any regular Alcohol  and or any Recreational drug use.  On your next visit with your primary care physician please Get Medicines reviewed and adjusted.  Please request your Prim.MD to go over all Hospital Tests and Procedure/Radiological results at the follow up, please get all Hospital records sent to your Prim MD by signing hospital release before you go home.  If you experience worsening of your admission symptoms, develop shortness of breath, life threatening emergency, suicidal or homicidal thoughts you must seek medical attention immediately by calling 911 or calling your MD immediately  if symptoms less severe.  You Must read complete instructions/literature along with all the possible adverse reactions/side effects for all the Medicines you take and that have been prescribed to you. Take any new Medicines after you have completely understood and accpet all the possible adverse reactions/side effects.   Increase activity slowly   Complete by: As directed        Discharge Medications   Allergies as of 08/31/2020   No Known Allergies      Medication List     STOP taking these medications    indomethacin 50 MG capsule Commonly known as: INDOCIN   insulin aspart 100 UNIT/ML injection Commonly known as: novoLOG Replaced by: insulin aspart 100 UNIT/ML FlexPen   lisinopril 5 MG tablet Commonly known as: ZESTRIL   potassium chloride 10 MEQ tablet Commonly known as: KLOR-CON       TAKE these medications    acetaminophen 325 MG tablet Commonly known as: TYLENOL Take 2  tablets (650 mg total) by mouth every 6 (six) hours as needed for moderate pain.   amLODipine 10 MG tablet Commonly known as: NORVASC  Take 1 tablet (10 mg total) by mouth daily. Start taking on: September 01, 2020 What changed:  medication strength how much to take   aspirin EC 81 MG tablet Take 1 tablet (81 mg total) by mouth daily. Swallow whole.   atorvastatin 40 MG tablet Commonly known as: LIPITOR Take 1 tablet (40 mg total) by mouth daily.   carvedilol 3.125 MG tablet Commonly known as: COREG Take 1 tablet (3.125 mg total) by mouth 2 (two) times daily with a meal.   cholecalciferol 25 MCG (1000 UNIT) tablet Commonly known as: VITAMIN D3 Take 1 tablet (1,000 Units total) by mouth daily.   gabapentin 100 MG capsule Commonly known as: NEURONTIN Take 1 capsule (100 mg total) by mouth 3 (three) times daily. What changed: how much to take   insulin aspart 100 UNIT/ML FlexPen Commonly known as: NOVOLOG Before each meal 3 times a day, 140-199 - 4 units, 200-250 - 8 units, 251-299 - 12 units,  300-349 - 14 units,  350 or above 20 units. Insulin PEN if approved, provide syringes and needles if needed. Replaces: insulin aspart 100 UNIT/ML injection   insulin glargine 100 unit/mL Sopn Commonly known as: LANTUS Inject 40 Units into the skin daily. What changed: how much to take   isosorbide mononitrate 30 MG 24 hr tablet Commonly known as: IMDUR Take 1 tablet (30 mg total) by mouth daily. Start taking on: September 01, 2020   tamsulosin 0.4 MG Caps capsule Commonly known as: FLOMAX Take 1 capsule (0.4 mg total) by mouth daily after supper.       ASK your doctor about these medications    linagliptin 5 MG Tabs tablet Commonly known as: TRADJENTA Take 1 tablet (5 mg total) by mouth daily.         Follow-up Information     [facility]. Schedule an appointment as soon as possible for a visit in 1 week(s).   Specialty: Internal  Medicine Contact information: [address] [id number] mc [address] [place] [address] [phone]        Orpah Cobb, MD. Schedule an appointment as soon as possible for a visit in 1 week(s).   Specialty: Cardiology Contact information: [address] [place] [address] [phone]                 Major procedures and Radiology Reports - PLEASE review detailed and final reports thoroughly  -       CT HEAD WO CONTRAST  Result Date: 08/28/2020 CLINICAL DATA:  Neuro deficit EXAM: CT HEAD WITHOUT CONTRAST TECHNIQUE: Contiguous axial images were obtained from the base of the skull through the vertex without intravenous contrast. COMPARISON:  CT head dated 09/18/2005 FINDINGS: Brain: No evidence of acute infarction, hemorrhage, hydrocephalus, extra-axial collection or mass lesion/mass effect. Encephalomalacic changes related to old left PCA distribution infarct. Additional lacunar infarct in the left cerebellum. Subcortical white matter and periventricular small vessel ischemic changes. Vascular: Intracranial atherosclerosis. Skull: Normal. Negative for fracture or focal lesion. Sinuses/Orbits: The visualized paranasal sinuses are essentially clear. The mastoid air cells are unopacified. Other: None. IMPRESSION: No evidence of acute intracranial abnormality. Old left PCA distribution infarct. Old left cerebellar infarct. Small vessel ischemic changes. Electronically Signed   By: Charline Bills M.D.   On: 08/28/2020 22:47   DG Chest Port 1 View  Result Date: 08/28/2020 CLINICAL DATA:  Portable CXR for right side chest pain. Pt is non compliant with medicine. See ED Notes: Patient reports  right side pain from his right shoulder to his right lower legs. History of a stroke in 2007, right side was the affected sidepain EXAM: PORTABLE CHEST 1 VIEW COMPARISON:  12/31/2018 FINDINGS: Normal mediastinum and cardiac silhouette. Normal pulmonary vasculature. No evidence  of effusion, infiltrate, or pneumothorax. No acute bony abnormality. IMPRESSION: No acute cardiopulmonary process. Electronically Signed   By: Genevive Bi M.D.   On: 08/28/2020 17:19   CT Renal Stone Study  Result Date: 08/28/2020 CLINICAL DATA:  Right flank pain EXAM: CT ABDOMEN AND PELVIS WITHOUT CONTRAST TECHNIQUE: Multidetector CT imaging of the abdomen and pelvis was performed following the standard protocol without IV contrast. COMPARISON:  02/13/2013 FINDINGS: Lower chest: There is slightly nodular peripheral parenchymal subpleural scarring, best appreciated on sagittal reformat # 53 within the visualized right lower lobe. The nodular component measures 12 mm x 23 mm at axial image # 5/5 and is indeterminate. While this most likely represents masslike scarring, a underlying pulmonary nodule is difficult to exclude on this examination alone. This appears new since prior examination. The lung bases are otherwise clear. Mild coronary artery calcification. Global cardiac size within normal limits. Hepatobiliary: No focal liver abnormality is seen. No gallstones, gallbladder wall thickening, or biliary dilatation. Pancreas: Unremarkable Spleen: Unremarkable Adrenals/Urinary Tract: The adrenal glands are unremarkable. The kidneys are normal in position. There is marked atrophy and cortical scarring of the right kidney. The left kidney is normal in size. No pelvicaliceal or ureteral calculi. No hydronephrosis. The bladder is unremarkable. Stomach/Bowel: Mild sigmoid diverticulosis. The stomach, small bowel, and large bowel are otherwise unremarkable. Appendix normal. No free intraperitoneal gas or fluid. Vascular/Lymphatic: Mild aortoiliac atherosclerotic calcification. No aortic aneurysm. No pathologic adenopathy within the abdomen and pelvis. Reproductive: Prostate is unremarkable. Other: No abdominal wall hernia.  The rectum is unremarkable. Musculoskeletal: No acute bone abnormality. IMPRESSION: No  acute intra-abdominal pathology identified. No definite radiographic explanation for the patient's reported symptoms. Interval development of nodular subpleural parenchymal scarring within the right lower lobe, not optimally characterized on this examination. A dedicated CT examination in 3 months would be helpful in documenting both stability as well as further characterizing this finding. Marked atrophy and cortical scarring of the right kidney, new since prior examination. Aortic Atherosclerosis (ICD10-I70.0).  Fall Electronically Signed   By: Helyn Numbers MD   On: 08/28/2020 19:15    Micro Results     Recent Results (from the past 240 hour(s))  Urine Culture     Status: Abnormal   Collection Time: 08/28/20  6:46 PM   Specimen: Urine, Clean Catch  Result Value Ref Range Status   Specimen Description URINE, CLEAN CATCH  Final   Special Requests NONE  Final   Culture (A)  Final    30,000 COLONIES/mL DIPHTHEROIDS(CORYNEBACTERIUM SPECIES) Standardized susceptibility testing for this organism is not available. Performed at [facility], [address] [place], [place] [address]    Report Status 08/29/2020 FINAL  Final  Resp Panel by RT-PCR (Flu A&B, Covid) Nasopharyngeal Swab     Status: None   Collection Time: 08/28/20 11:47 PM   Specimen: Nasopharyngeal Swab; Nasopharyngeal(NP) swabs in vial transport medium  Result Value Ref Range Status   SARS Coronavirus 2 by RT PCR NEGATIVE NEGATIVE Final    Comment: (NOTE) SARS-CoV-2 target nucleic acids are NOT DETECTED.  The SARS-CoV-2 RNA is generally detectable in upper respiratory specimens during the acute phase of infection. The lowest concentration of SARS-CoV-2 viral copies this assay can detect is  138 copies/mL. A negative result does not preclude SARS-Cov-2 infection and should not be used as the sole basis for treatment or other patient management decisions. A negative result may occur with  improper specimen  collection/handling, submission of specimen other than nasopharyngeal swab, presence of viral mutation(s) within the areas targeted by this assay, and inadequate number of viral copies(<138 copies/mL). A negative result must be combined with clinical observations, patient history, and epidemiological information. The expected result is Negative.  Fact Sheet for Patients:  BloggerCourse.com  Fact Sheet for Healthcare Providers:  SeriousBroker.it  This test is no t yet approved or cleared by the [place] FDA and  has been authorized for detection and/or diagnosis of SARS-CoV-2 by FDA under an Emergency Use Authorization (EUA). This EUA will remain  in effect (meaning this test can be used) for the duration of the COVID-19 declaration under Section 564(b)(1) of the Act, 21 U.S.C.section 360bbb-3(b)(1), unless the authorization is terminated  or revoked sooner.       Influenza A by PCR NEGATIVE NEGATIVE Final   Influenza B by PCR NEGATIVE NEGATIVE Final    Comment: (NOTE) The Xpert Xpress SARS-CoV-2/FLU/RSV plus assay is intended as an aid in the diagnosis of influenza from Nasopharyngeal swab specimens and should not be used as a sole basis for treatment. Nasal washings and aspirates are unacceptable for Xpert Xpress SARS-CoV-2/FLU/RSV testing.  Fact Sheet for Patients: BloggerCourse.com  Fact Sheet for Healthcare Providers: SeriousBroker.it  This test is not yet approved or cleared by the [place] FDA and has been authorized for detection and/or diagnosis of SARS-CoV-2 by FDA under an Emergency Use Authorization (EUA). This EUA will remain in effect (meaning this test can be used) for the duration of the COVID-19 declaration under Section 564(b)(1) of the Act, 21 U.S.C. section 360bbb-3(b)(1), unless the authorization is terminated or revoked.  Performed at [facility], [address] [place], [place] [address]     Today   Subjective    Moustapha Tooker today has no headache,no chest abdominal pain,no new weakness tingling or numbness, feels much better wants to go home today.     Objective   Blood pressure 129/77, pulse 70, temperature (!) 97.5 F (36.4 C), temperature source Oral, resp. rate 16, height 6\' 3"  (1.905 m), weight 115.8 kg, SpO2 100 %.   Intake/Output Summary (Last 24 hours) at 08/31/2020 1052 Last data filed at 08/31/2020 0615 Gross per 24 hour  Intake 360 ml  Output 150 ml  Net 210 ml    Exam  Awake Alert, No new F.N deficits, chronic mild R sided weakness, Normal affect [place].AT,PERRAL Supple Neck,No JVD, No cervical lymphadenopathy appriciated.  Symmetrical Chest wall movement, Good air movement bilaterally, CTAB RRR,No Gallops,Rubs or new Murmurs, No Parasternal Heave +ve B.Sounds, Abd Soft, Non tender, No organomegaly appriciated, No rebound -guarding or rigidity. No Cyanosis, Clubbing or edema, No new Rash or bruise   Data Review   CBC w Diff:  Lab Results  Component Value Date   WBC 7.7 08/31/2020   HGB 12.5 (L) 08/31/2020   HCT 38.0 (L) 08/31/2020   PLT 283 08/31/2020   LYMPHOPCT 28 08/31/2020   BANDSPCT 0 04/13/2008   MONOPCT 12 08/31/2020   EOSPCT 1 08/31/2020   BASOPCT 1 08/31/2020    CMP:  Lab Results  Component Value Date   NA 139 08/31/2020   K 4.0 08/31/2020   CL 108 08/31/2020   CO2 26 08/31/2020   BUN 11 08/31/2020  CREATININE 1.52 (H) 08/31/2020   PROT 6.2 (L) 08/31/2020   ALBUMIN 2.9 (L) 08/31/2020   BILITOT 0.5 08/31/2020   ALKPHOS 59 08/31/2020   AST 16 08/31/2020   ALT 13 08/31/2020  . Lab Results  Component Value Date   HGBA1C >15.5 (H) 08/29/2020   Lab Results  Component Value Date   CHOL 165 08/29/2020   HDL 42 08/29/2020   LDLCALC 102 (H) 08/29/2020   TRIG 103 08/29/2020   CHOLHDL 3.9 08/29/2020   Lab Results  Component Value Date   TSH 0.977  08/29/2020     Total Time in preparing paper work, data evaluation and todays exam - 35 minutes  Susa Raring M.D on 08/31/2020 at 10:52 AM  [facility]

## 2020-08-31 NOTE — Progress Notes (Signed)
Occupational Therapy Evaluation Patient Details Name: [name] MRN: [id number] DOB: Mar 23, 1950 Today's Date: 08/31/2020    History of Present Illness Pt is a 70 y.o. male admitted 08/28/20 with worsening R-side pain; in ED, found to be hyperglycemic. Workup for nonketotic hypersmolar state due to noncompliance with medications. PMH includes DM2, CVA (residual R-side weakness & paresthesia), HTN, CKD2, MI, obesity, tobacco use.   Clinical Impression   [name] was evaluated for the above. PTA pt was indep in all ADL/IADLs including driving, he lives alone in an entry level apartment. At this time, pt is performing all ADLs and functional room distance ambulation at his baseline mod I level. Pt observed using room furniture as an external support during ambulation.  Pt would benefit from a 3in1 to place over his toilet and a cane for community and home mobility. Pt does not require skilled acute OT services. Recommend d/c home without follow up.     Follow Up Recommendations  No OT follow up;Supervision - Intermittent    Equipment Recommendations  3 in 1 bedside commode       Precautions / Restrictions Precautions Precautions: Fall Restrictions Weight Bearing Restrictions: No      Mobility Bed Mobility Overal bed mobility: Modified Independent        Transfers Overall transfer level: Modified independent Equipment used: None             General transfer comment: mod I for sit<>stand, pt may benefit from a cane in standing for ambulation    Balance Overall balance assessment: Needs assistance   Sitting balance-Leahy Scale: Normal       Standing balance-Leahy Scale: Fair           ADL either performed or assessed with clinical judgement   ADL Overall ADL's : Needs assistance/impaired Eating/Feeding: Independent;Sitting   Grooming: Wash/dry hands;Wash/dry face;Oral care;Applying deodorant;Supervision/safety;Standing   Upper Body Bathing: Supervision/  safety;Standing;Sitting   Lower Body Bathing: Supervison/ safety;Sit to/from stand   Upper Body Dressing : Supervision/safety;Sitting   Lower Body Dressing: Supervision/safety;Sit to/from stand   Toilet Transfer: Supervision/safety;Ambulation;Grab bars;Comfort height toilet   Toileting- Clothing Manipulation and Hygiene: Supervision/safety;Sit to/from stand       Functional mobility during ADLs: Supervision/safety;Cueing for safety General ADL Comments: Pt performing ADLs at baseline - supervision for safety     Vision Patient Visual Report: No change from baseline Vision Assessment?: Vision impaired- to be further tested in functional context Additional Comments: Pt reports decrased vision in his R eye, wears reading glasses, prefers not to drive at night            Pertinent Vitals/Pain Pain Assessment: 0-10 Pain Score: 5  Pain Location: generalized R side Pain Intervention(s): Monitored during session     Hand Dominance Right   Extremity/Trunk Assessment Upper Extremity Assessment Upper Extremity Assessment: RUE deficits/detail RUE Deficits / Details: residual R sided weakness from CVA 2007 RUE:  (generalized 4/5 MMT) RUE Sensation: decreased light touch RUE Coordination: decreased fine motor   Lower Extremity Assessment Lower Extremity Assessment: Defer to PT evaluation   Cervical / Trunk Assessment Cervical / Trunk Assessment: Normal   Communication Communication Communication: No difficulties   Cognition Arousal/Alertness: Awake/alert Behavior During Therapy: WFL for tasks assessed/performed Overall Cognitive Status: Within Functional Limits for tasks assessed           General Comments  VAA on RA - pt with reports of feeling much better today     Home Living Family/patient expects to be discharged to::  Private residence Living Arrangements: Alone Available Help at Discharge: Friend(s);Available PRN/intermittently Type of Home: Apartment Home  Access: Level entry     Home Layout: One level     Bathroom Shower/Tub: Tub/shower unit;Curtain   Firefighter: Standard Bathroom Accessibility: Yes How Accessible: Accessible via walker Home Equipment: None          Prior Functioning/Environment Level of Independence: Independent                 OT Problem List: Decreased strength;Decreased activity tolerance;Decreased safety awareness      OT Treatment/Interventions:      OT Goals(Current goals can be found in the care plan section) Acute Rehab OT Goals Patient Stated Goal: home soon OT Goal Formulation: With patient   AM-PAC OT "6 Clicks" Daily Activity     Outcome Measure Help from another person eating meals?: None Help from another person taking care of personal grooming?: None Help from another person toileting, which includes using toliet, bedpan, or urinal?: A Little Help from another person bathing (including washing, rinsing, drying)?: A Little Help from another person to put on and taking off regular upper body clothing?: None Help from another person to put on and taking off regular lower body clothing?: A Little 6 Click Score: 21   End of Session Nurse Communication: Mobility status  Activity Tolerance: Patient tolerated treatment well Patient left: in chair;with call bell/phone within reach;with chair alarm set  OT Visit Diagnosis: Unsteadiness on feet (R26.81);Pain                Time: 2423-5361 OT Time Calculation (min): 16 min Charges:  OT General Charges $OT Visit: 1 Visit OT Evaluation $OT Eval Low Complexity: 1 Low   [name] A [name] 08/31/2020, 8:30 AM

## 2020-12-29 ENCOUNTER — Encounter (HOSPITAL_COMMUNITY): Payer: Self-pay | Admitting: Radiology

## 2021-09-20 ENCOUNTER — Other Ambulatory Visit (HOSPITAL_COMMUNITY): Payer: Self-pay

## 2022-01-19 IMAGING — CT CT HEAD W/O CM
4 series · 17 of 47 positions shown, 19 images · non-contrast
Comparison: CT head dated 09/18/2005

CLINICAL DATA: Neuro deficit

EXAM:
CT HEAD WITHOUT CONTRAST
TECHNIQUE: Contiguous axial images were obtained from the base of the skull
through the vertex without intravenous contrast.

[Series 2: head wo · axial · 0.52mm/px · z∈[-614,-468]mm · 7 of 39 slices shown, 9 images]
[im 5/39  brain]
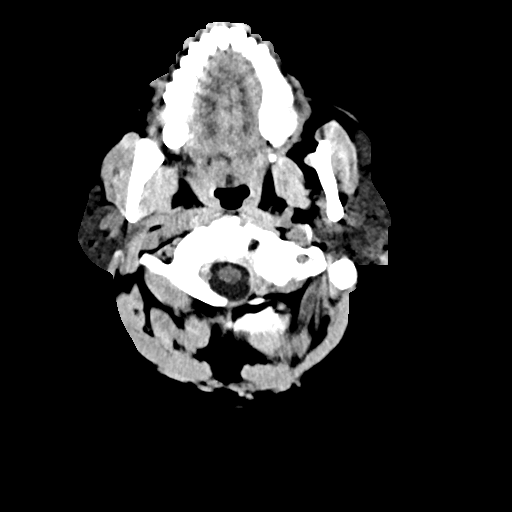
[im 5/39  bone]
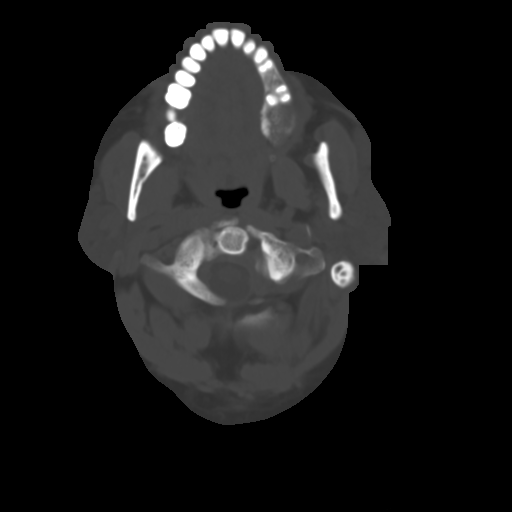
[im 10/39  brain]
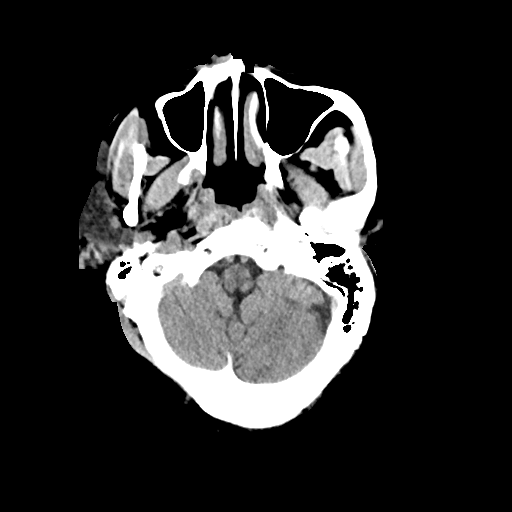
[im 15/39  brain]
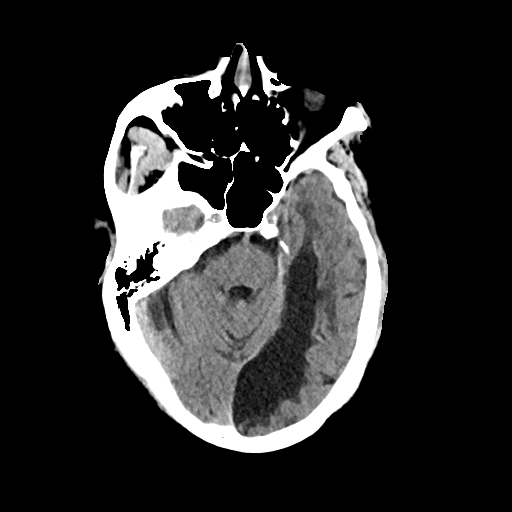
[im 20/39  brain]
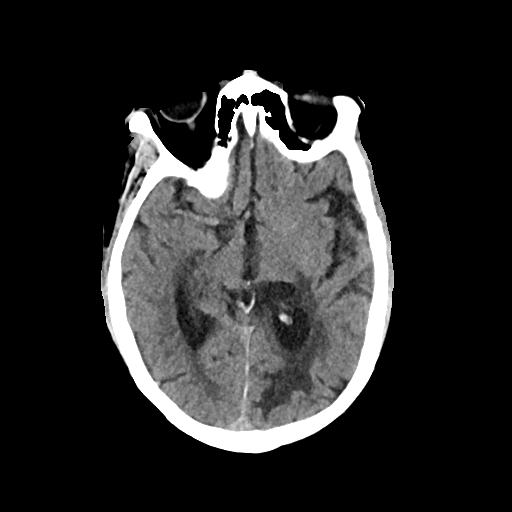
[im 24/39  brain]
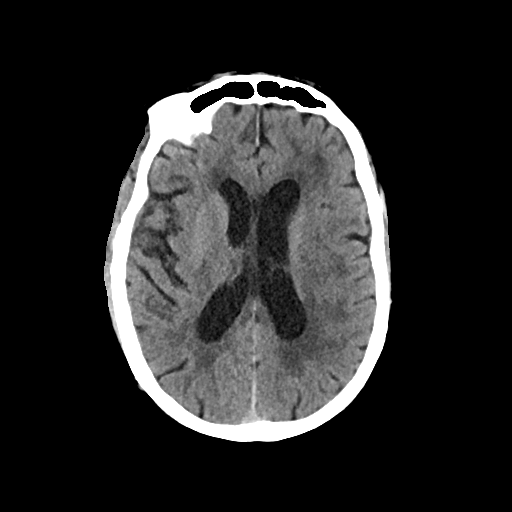
[im 24/39  bone]
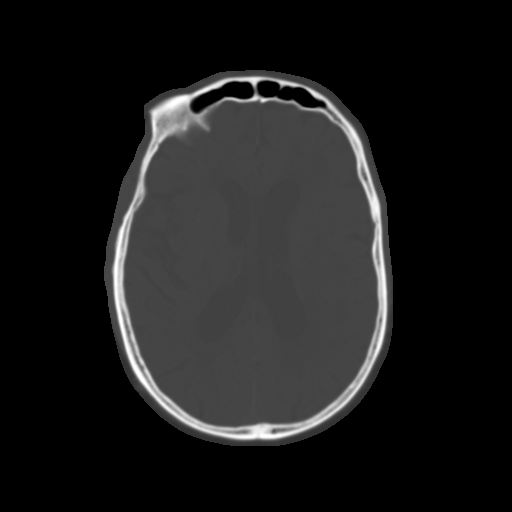
[im 29/39  brain]
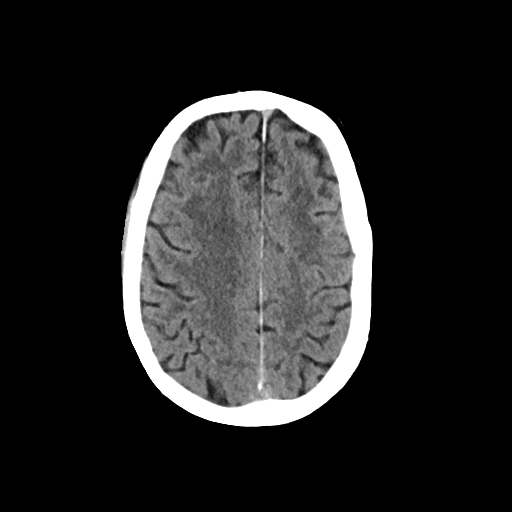
[im 34/39  brain]
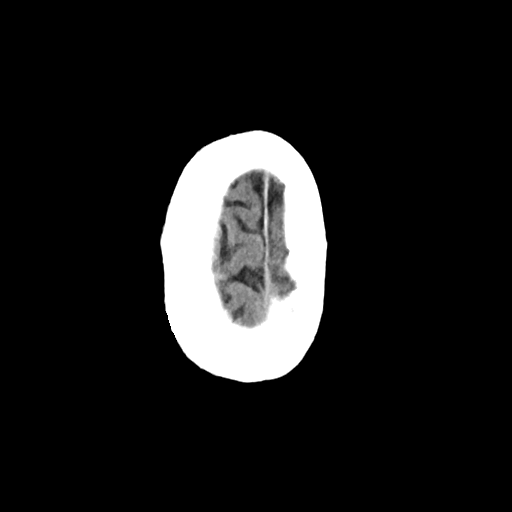

[Series 3: head bone · axial · 0.52mm/px · z∈[-616,-548]mm · 4 of 98 slices shown]
[im 10/98  bone]
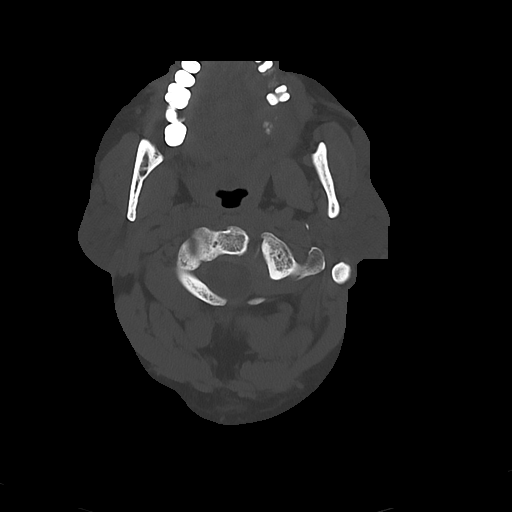
[im 20/98  bone]
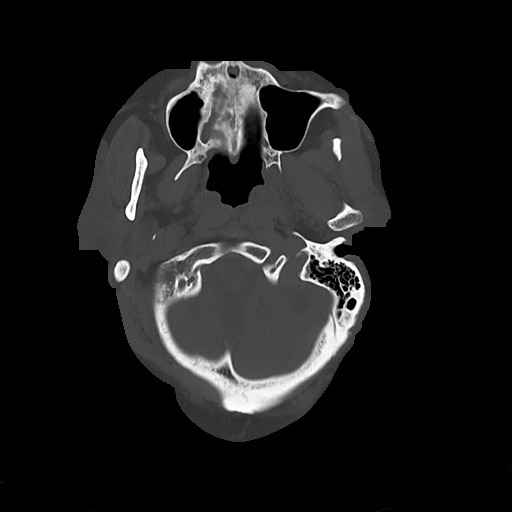
[im 30/98  bone]
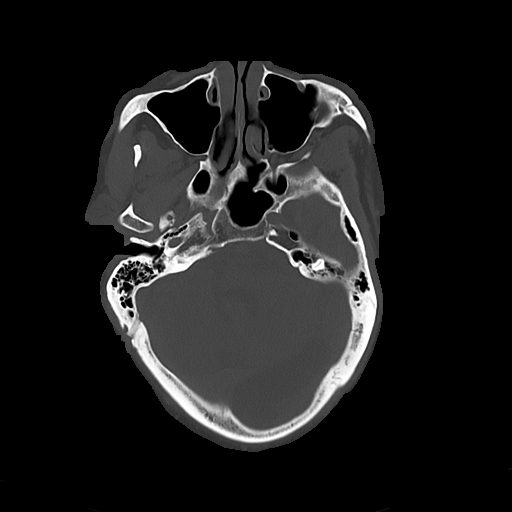
[im 44/98  bone]
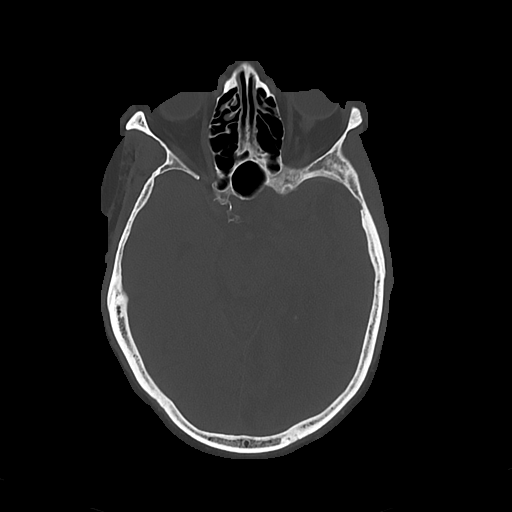

[Series 4: cor soft · coronal · 0.40mm/px · 3 of 77 slices shown]
[im 26/77  brain]
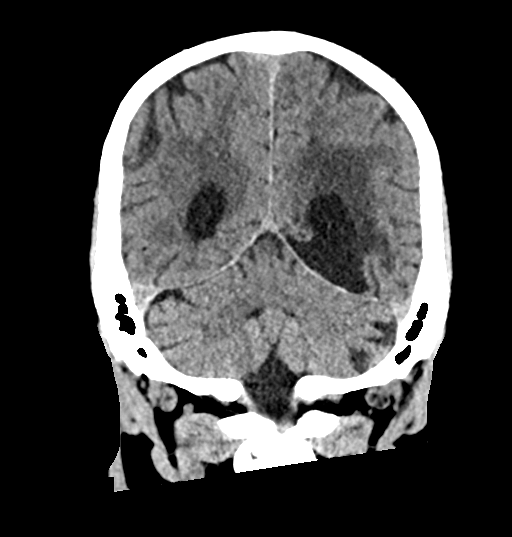
[im 34/77  brain]
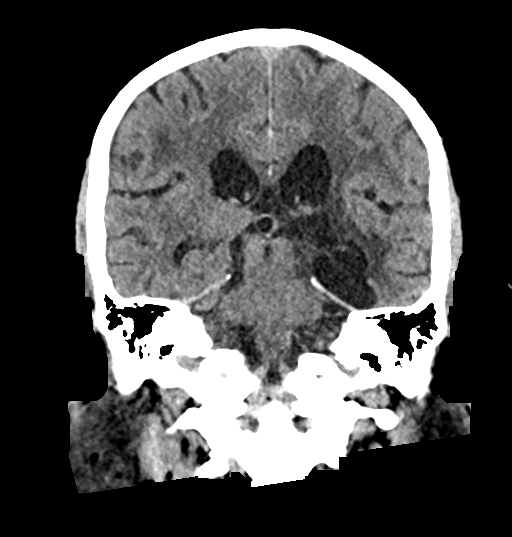
[im 43/77  brain]
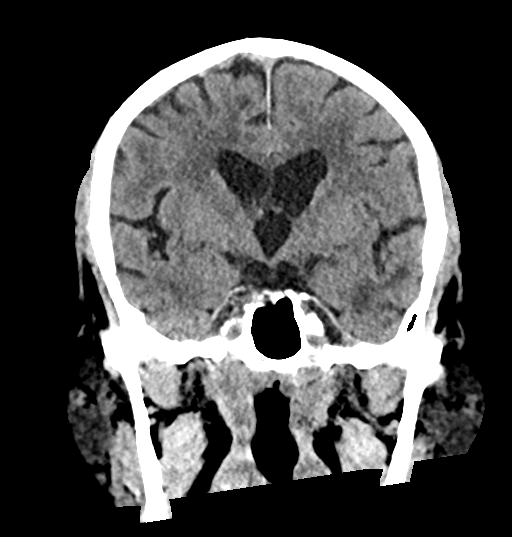

[Series 5: sag soft · sagittal · 0.42mm/px · 3 of 67 slices shown]
[im 26/67  brain]
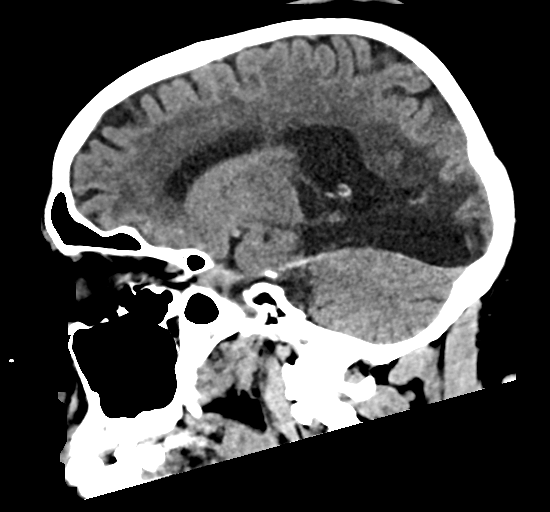
[im 33/67  brain]
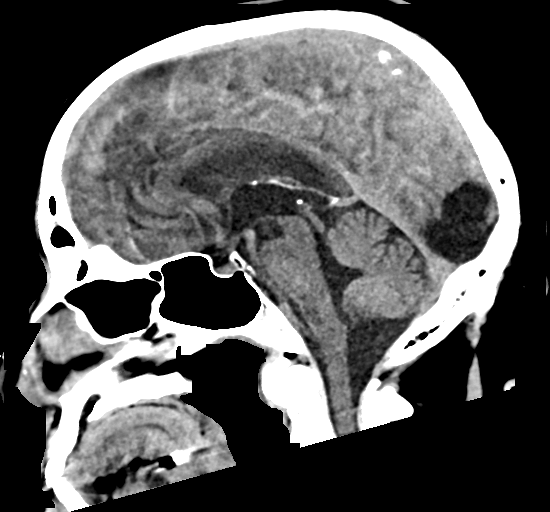
[im 41/67  brain]
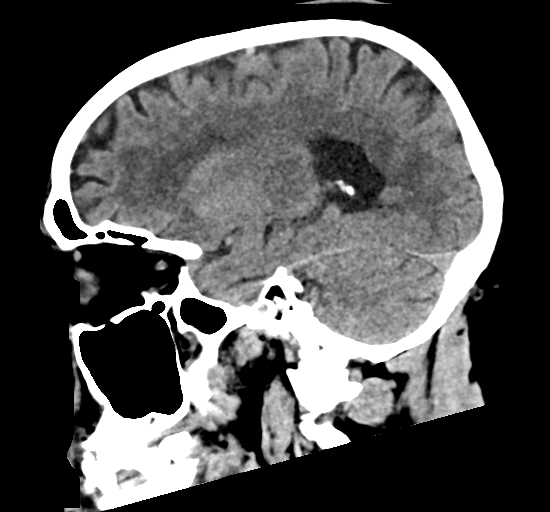

[17 of 47 positions shown; findings below may reference images not displayed]

FINDINGS: Brain: No evidence of acute infarction, hemorrhage, hydrocephalus,
extra-axial collection or mass lesion/mass effect.

Encephalomalacic changes related to old left PCA distribution
infarct. Additional lacunar infarct in the left cerebellum.

Subcortical white matter and periventricular small vessel ischemic
changes.

Vascular: Intracranial atherosclerosis.

Skull: Normal. Negative for fracture or focal lesion.

Sinuses/Orbits: The visualized paranasal sinuses are essentially
clear. The mastoid air cells are unopacified.

Other: None.
IMPRESSION: No evidence of acute intracranial abnormality.

Old left PCA distribution infarct. Old left cerebellar infarct.
Small vessel ischemic changes.

## 2022-05-19 ENCOUNTER — Emergency Department (HOSPITAL_COMMUNITY): Payer: Medicare HMO

## 2022-05-19 ENCOUNTER — Encounter (HOSPITAL_COMMUNITY): Payer: Self-pay | Admitting: Emergency Medicine

## 2022-05-19 ENCOUNTER — Inpatient Hospital Stay (HOSPITAL_COMMUNITY)
Admission: EM | Admit: 2022-05-19 | Discharge: 2022-05-25 | DRG: 638 | Disposition: A | Discharge status: Skilled Nursing Facility | Payer: Medicare HMO | Attending: Family Medicine | Admitting: Family Medicine

## 2022-05-19 ENCOUNTER — Other Ambulatory Visit: Payer: Self-pay

## 2022-05-19 DIAGNOSIS — N179 Acute kidney failure, unspecified: Secondary | ICD-10-CM | POA: Diagnosis not present

## 2022-05-19 DIAGNOSIS — G8190 Hemiplegia, unspecified affecting unspecified side: Secondary | ICD-10-CM

## 2022-05-19 DIAGNOSIS — H53461 Homonymous bilateral field defects, right side: Secondary | ICD-10-CM | POA: Diagnosis present

## 2022-05-19 DIAGNOSIS — Z66 Do not resuscitate: Secondary | ICD-10-CM | POA: Diagnosis present

## 2022-05-19 DIAGNOSIS — E1122 Type 2 diabetes mellitus with diabetic chronic kidney disease: Secondary | ICD-10-CM | POA: Diagnosis present

## 2022-05-19 DIAGNOSIS — Z794 Long term (current) use of insulin: Secondary | ICD-10-CM

## 2022-05-19 DIAGNOSIS — R1011 Right upper quadrant pain: Secondary | ICD-10-CM | POA: Diagnosis present

## 2022-05-19 DIAGNOSIS — Z1152 Encounter for screening for COVID-19: Secondary | ICD-10-CM

## 2022-05-19 DIAGNOSIS — E1140 Type 2 diabetes mellitus with diabetic neuropathy, unspecified: Secondary | ICD-10-CM | POA: Diagnosis present

## 2022-05-19 DIAGNOSIS — N400 Enlarged prostate without lower urinary tract symptoms: Secondary | ICD-10-CM | POA: Diagnosis present

## 2022-05-19 DIAGNOSIS — M792 Neuralgia and neuritis, unspecified: Secondary | ICD-10-CM

## 2022-05-19 DIAGNOSIS — E1100 Type 2 diabetes mellitus with hyperosmolarity without nonketotic hyperglycemic-hyperosmolar coma (NKHHC): Principal | ICD-10-CM | POA: Diagnosis present

## 2022-05-19 DIAGNOSIS — Z8673 Personal history of transient ischemic attack (TIA), and cerebral infarction without residual deficits: Secondary | ICD-10-CM

## 2022-05-19 DIAGNOSIS — I129 Hypertensive chronic kidney disease with stage 1 through stage 4 chronic kidney disease, or unspecified chronic kidney disease: Secondary | ICD-10-CM | POA: Diagnosis present

## 2022-05-19 DIAGNOSIS — E871 Hypo-osmolality and hyponatremia: Secondary | ICD-10-CM

## 2022-05-19 DIAGNOSIS — R2689 Other abnormalities of gait and mobility: Secondary | ICD-10-CM | POA: Diagnosis present

## 2022-05-19 DIAGNOSIS — G8929 Other chronic pain: Secondary | ICD-10-CM | POA: Diagnosis present

## 2022-05-19 DIAGNOSIS — Z79899 Other long term (current) drug therapy: Secondary | ICD-10-CM

## 2022-05-19 DIAGNOSIS — E860 Dehydration: Secondary | ICD-10-CM | POA: Diagnosis present

## 2022-05-19 DIAGNOSIS — K5900 Constipation, unspecified: Secondary | ICD-10-CM | POA: Diagnosis present

## 2022-05-19 DIAGNOSIS — F32A Depression, unspecified: Secondary | ICD-10-CM | POA: Diagnosis present

## 2022-05-19 DIAGNOSIS — F17290 Nicotine dependence, other tobacco product, uncomplicated: Secondary | ICD-10-CM | POA: Diagnosis present

## 2022-05-19 DIAGNOSIS — I69351 Hemiplegia and hemiparesis following cerebral infarction affecting right dominant side: Secondary | ICD-10-CM

## 2022-05-19 DIAGNOSIS — N182 Chronic kidney disease, stage 2 (mild): Secondary | ICD-10-CM | POA: Diagnosis present

## 2022-05-19 DIAGNOSIS — G4710 Hypersomnia, unspecified: Secondary | ICD-10-CM | POA: Diagnosis present

## 2022-05-19 DIAGNOSIS — Z7982 Long term (current) use of aspirin: Secondary | ICD-10-CM

## 2022-05-19 DIAGNOSIS — E861 Hypovolemia: Secondary | ICD-10-CM | POA: Diagnosis present

## 2022-05-19 DIAGNOSIS — I69398 Other sequelae of cerebral infarction: Secondary | ICD-10-CM

## 2022-05-19 DIAGNOSIS — W19XXXA Unspecified fall, initial encounter: Secondary | ICD-10-CM

## 2022-05-19 DIAGNOSIS — Z91148 Patient's other noncompliance with medication regimen for other reason: Secondary | ICD-10-CM

## 2022-05-19 DIAGNOSIS — Z7984 Long term (current) use of oral hypoglycemic drugs: Secondary | ICD-10-CM

## 2022-05-19 DIAGNOSIS — I252 Old myocardial infarction: Secondary | ICD-10-CM

## 2022-05-19 DIAGNOSIS — I10 Essential (primary) hypertension: Secondary | ICD-10-CM | POA: Diagnosis present

## 2022-05-19 DIAGNOSIS — R739 Hyperglycemia, unspecified: Secondary | ICD-10-CM | POA: Diagnosis present

## 2022-05-19 DIAGNOSIS — E1110 Type 2 diabetes mellitus with ketoacidosis without coma: Secondary | ICD-10-CM | POA: Diagnosis present

## 2022-05-19 DIAGNOSIS — R531 Weakness: Secondary | ICD-10-CM

## 2022-05-19 DIAGNOSIS — E785 Hyperlipidemia, unspecified: Secondary | ICD-10-CM | POA: Diagnosis present

## 2022-05-19 DIAGNOSIS — E119 Type 2 diabetes mellitus without complications: Secondary | ICD-10-CM

## 2022-05-19 DIAGNOSIS — R109 Unspecified abdominal pain: Secondary | ICD-10-CM

## 2022-05-19 LAB — CBC
HCT: 47.4 % (ref 39.0–52.0)
Hemoglobin: 15.6 g/dL (ref 13.0–17.0)
MCH: 29.5 pg (ref 26.0–34.0)
MCHC: 32.9 g/dL (ref 30.0–36.0)
MCV: 89.8 fL (ref 80.0–100.0)
Platelets: 384 K/uL (ref 150–400)
RBC: 5.28 MIL/uL (ref 4.22–5.81)
RDW: 13.1 % (ref 11.5–15.5)
WBC: 11.6 K/uL — ABNORMAL HIGH (ref 4.0–10.5)
nRBC: 0 % (ref 0.0–0.2)

## 2022-05-19 LAB — BLOOD GAS, VENOUS
Acid-base deficit: 3.5 mmol/L — ABNORMAL HIGH (ref 0.0–2.0)
Bicarbonate: 22.1 mmol/L (ref 20.0–28.0)
O2 Saturation: 24.9 %
Patient temperature: 36.8
pCO2, Ven: 41 mmHg — ABNORMAL LOW (ref 44–60)
pH, Ven: 7.34 (ref 7.25–7.43)
pO2, Ven: <31 mmHg — CL (ref 32–45)

## 2022-05-19 LAB — DIFFERENTIAL
Abs Immature Granulocytes: 0.09 10*3/uL — ABNORMAL HIGH (ref 0.00–0.07)
Basophils Absolute: 0 10*3/uL (ref 0.0–0.1)
Basophils Relative: 0 %
Eosinophils Absolute: 0 10*3/uL (ref 0.0–0.5)
Eosinophils Relative: 0 %
Immature Granulocytes: 1 %
Lymphocytes Relative: 16 %
Lymphs Abs: 1.8 10*3/uL (ref 0.7–4.0)
Monocytes Absolute: 1.4 10*3/uL — ABNORMAL HIGH (ref 0.1–1.0)
Monocytes Relative: 12 %
Neutro Abs: 8.3 10*3/uL — ABNORMAL HIGH (ref 1.7–7.7)
Neutrophils Relative %: 71 %

## 2022-05-19 LAB — BASIC METABOLIC PANEL
Anion gap: 15 (ref 5–15)
BUN: 48 mg/dL — ABNORMAL HIGH (ref 8–23)
CO2: 19 mmol/L — ABNORMAL LOW (ref 22–32)
Calcium: 8.7 mg/dL — ABNORMAL LOW (ref 8.9–10.3)
Chloride: 94 mmol/L — ABNORMAL LOW (ref 98–111)
Creatinine, Ser: 3.09 mg/dL — ABNORMAL HIGH (ref 0.61–1.24)
GFR, Estimated: 20 mL/min — ABNORMAL LOW (ref >60)
Glucose, Bld: 214 mg/dL — ABNORMAL HIGH (ref 70–99)
Potassium: 5 mmol/L (ref 3.5–5.1)
Sodium: 128 mmol/L — ABNORMAL LOW (ref 135–145)

## 2022-05-19 LAB — PROTIME-INR
INR: 1 (ref 0.8–1.2)
Prothrombin Time: 13.5 seconds (ref 11.4–15.2)

## 2022-05-19 LAB — COMPREHENSIVE METABOLIC PANEL WITH GFR
ALT: 21 U/L (ref 0–44)
AST: 23 U/L (ref 15–41)
Albumin: 2.7 g/dL — ABNORMAL LOW (ref 3.5–5.0)
Alkaline Phosphatase: 126 U/L (ref 38–126)
Anion gap: 20 — ABNORMAL HIGH (ref 5–15)
BUN: 41 mg/dL — ABNORMAL HIGH (ref 8–23)
CO2: 15 mmol/L — ABNORMAL LOW (ref 22–32)
Calcium: 8.9 mg/dL (ref 8.9–10.3)
Chloride: 91 mmol/L — ABNORMAL LOW (ref 98–111)
Creatinine, Ser: 2.92 mg/dL — ABNORMAL HIGH (ref 0.61–1.24)
GFR, Estimated: 22 mL/min — ABNORMAL LOW
Glucose, Bld: 249 mg/dL — ABNORMAL HIGH (ref 70–99)
Potassium: 5 mmol/L (ref 3.5–5.1)
Sodium: 126 mmol/L — ABNORMAL LOW (ref 135–145)
Total Bilirubin: 0.8 mg/dL (ref 0.3–1.2)
Total Protein: 7.6 g/dL (ref 6.5–8.1)

## 2022-05-19 LAB — TROPONIN I (HIGH SENSITIVITY)
Troponin I (High Sensitivity): 14 ng/L
Troponin I (High Sensitivity): 10 ng/L (ref <18)

## 2022-05-19 LAB — ETHANOL: Alcohol, Ethyl (B): <10 mg/dL

## 2022-05-19 LAB — HEMOGLOBIN A1C
Hgb A1c MFr Bld: 13.3 % — ABNORMAL HIGH (ref 4.8–5.6)
Mean Plasma Glucose: 335.01 mg/dL

## 2022-05-19 LAB — GLUCOSE, CAPILLARY: Glucose-Capillary: 204 mg/dL — ABNORMAL HIGH (ref 70–99)

## 2022-05-19 LAB — SARS CORONAVIRUS 2 BY RT PCR: SARS Coronavirus 2 by RT PCR: NEGATIVE

## 2022-05-19 LAB — BETA-HYDROXYBUTYRIC ACID: Beta-Hydroxybutyric Acid: 2.41 mmol/L — ABNORMAL HIGH (ref 0.05–0.27)

## 2022-05-19 LAB — APTT: aPTT: 28 seconds (ref 24–36)

## 2022-05-19 LAB — CBG MONITORING, ED: Glucose-Capillary: 271 mg/dL — ABNORMAL HIGH (ref 70–99)

## 2022-05-19 MED ORDER — INSULIN ASPART 100 UNIT/ML IJ SOLN
0.0000 [IU] | Freq: Three times a day (TID) | INTRAMUSCULAR | Status: DC
  Administered 2022-05-20: 1 [IU] via SUBCUTANEOUS

## 2022-05-19 MED ORDER — SODIUM CHLORIDE 0.9 % IV BOLUS
500.0000 mL | Freq: Once | INTRAVENOUS | Status: AC
  Administered 2022-05-19: 500 mL via INTRAVENOUS

## 2022-05-19 MED ORDER — FENTANYL CITRATE PF 50 MCG/ML IJ SOSY
50.0000 ug | PREFILLED_SYRINGE | Freq: Once | INTRAMUSCULAR | Status: AC
  Administered 2022-05-19: 50 ug via INTRAVENOUS
  Filled 2022-05-19: qty 1

## 2022-05-19 MED ORDER — ONDANSETRON HCL 4 MG/2ML IJ SOLN
4.0000 mg | Freq: Once | INTRAMUSCULAR | Status: AC
  Administered 2022-05-19: 4 mg via INTRAVENOUS
  Filled 2022-05-19: qty 2

## 2022-05-19 MED ORDER — ACETAMINOPHEN 325 MG PO TABS
650.0000 mg | ORAL_TABLET | Freq: Four times a day (QID) | ORAL | Status: DC
  Administered 2022-05-19 – 2022-05-20 (×3): 650 mg via ORAL
  Filled 2022-05-19 (×3): qty 2

## 2022-05-19 MED ORDER — ASPIRIN 81 MG PO TBEC
81.0000 mg | DELAYED_RELEASE_TABLET | Freq: Every day | ORAL | Status: DC
  Administered 2022-05-19 – 2022-05-25 (×7): 81 mg via ORAL
  Filled 2022-05-19 (×7): qty 1

## 2022-05-19 MED ORDER — ATORVASTATIN CALCIUM 40 MG PO TABS
40.0000 mg | ORAL_TABLET | Freq: Every day | ORAL | Status: DC
  Administered 2022-05-19 – 2022-05-25 (×7): 40 mg via ORAL
  Filled 2022-05-19 (×7): qty 1

## 2022-05-19 MED ORDER — TRAMADOL HCL 50 MG PO TABS
50.0000 mg | ORAL_TABLET | Freq: Four times a day (QID) | ORAL | Status: DC | PRN
  Administered 2022-05-19 – 2022-05-24 (×11): 50 mg via ORAL
  Filled 2022-05-19 (×11): qty 1

## 2022-05-19 MED ORDER — MORPHINE SULFATE (PF) 4 MG/ML IV SOLN
4.0000 mg | Freq: Once | INTRAVENOUS | Status: AC
  Administered 2022-05-19: 4 mg via INTRAVENOUS
  Filled 2022-05-19: qty 1

## 2022-05-19 MED ORDER — CARVEDILOL 12.5 MG PO TABS
12.5000 mg | ORAL_TABLET | Freq: Two times a day (BID) | ORAL | Status: DC
  Administered 2022-05-20 – 2022-05-25 (×11): 12.5 mg via ORAL
  Filled 2022-05-19 (×11): qty 1

## 2022-05-19 MED ORDER — TAMSULOSIN HCL 0.4 MG PO CAPS
0.4000 mg | ORAL_CAPSULE | Freq: Every day | ORAL | Status: DC
  Administered 2022-05-19 – 2022-05-24 (×6): 0.4 mg via ORAL
  Filled 2022-05-19 (×6): qty 1

## 2022-05-19 MED ORDER — ENOXAPARIN SODIUM 30 MG/0.3ML IJ SOSY
30.0000 mg | PREFILLED_SYRINGE | INTRAMUSCULAR | Status: DC
  Administered 2022-05-19 – 2022-05-20 (×2): 30 mg via SUBCUTANEOUS
  Filled 2022-05-19 (×2): qty 0.3

## 2022-05-19 MED ORDER — SODIUM CHLORIDE 0.9 % IV SOLN: INTRAVENOUS | Status: DC

## 2022-05-19 NOTE — ED Notes (Signed)
Pt returned from MRI °

## 2022-05-19 NOTE — Assessment & Plan Note (Addendum)
Likely prerenal in the setting of dehydration. Scr 2.9 to (baseline~1.52-1.56).  Hold nephrotoxic medications. -Encourage p.o. intake -IVF maintenance as described above -I/os -Avoid nephrotoxic agents

## 2022-05-19 NOTE — ED Provider Notes (Signed)
[place] EMERGENCY DEPARTMENT AT [facility] Provider Note   CSN: [id number] Arrival date & time: 05/19/22  0805     History  Chief Complaint  Patient presents with   Weakness   Constipation    [name] is a 72 y.o. male.  He has a history of diabetes hypertension stroke which left him with some residual right-sided deficits.  He said he said chronic pain in the right side of his body since his stroke over 15 years ago.  He said he fell a couple weeks ago and since then his right-sided weakness has been worse.  His pain has been worse.  He also has not had any appetite for the last 2 weeks and has not moved his bowels.  He denies any headache blurry vision chest pain shortness of breath nausea vomiting.  He denies any urinary symptoms.  He said he does not have a primary care doctor and although he checks his sugars he does not know what numbers he has been getting.  He does not use a cane or a walker to ambulate  The history is provided by the patient.  Weakness Severity:  Moderate Onset quality:  Gradual Duration:  2 weeks Timing:  Constant Progression:  Unchanged Chronicity:  New Relieved by:  Nothing Worsened by:  Activity Ineffective treatments:  Rest Associated symptoms: abdominal pain, extremity numbness, falls and stroke symptoms   Associated symptoms: no chest pain, no cough, no diarrhea, no dysphagia, no dysuria, no fever, no headaches, no nausea, no shortness of breath and no vomiting   Risk factors: diabetes and neurologic disease   Constipation Associated symptoms: abdominal pain   Associated symptoms: no diarrhea, no dysuria, no fever, no nausea and no vomiting        Home Medications Prior to Admission medications   Medication Sig Start Date End Date Taking? Authorizing Provider  acetaminophen (TYLENOL) 325 MG tablet Take 2 tablets (650 mg total) by mouth every 6 (six) hours as needed for moderate pain. 01/03/19   Orpah Cobb, MD   amLODipine (NORVASC) 10 MG tablet Take 1 tablet (10 mg total) by mouth daily. 09/01/20   Leroy Sea, MD  aspirin 81 MG EC tablet Take 1 tablet (81 mg total) by mouth daily. Swallow whole. 08/31/20   Leroy Sea, MD  atorvastatin (LIPITOR) 40 MG tablet Take 1 tablet (40 mg total) by mouth daily. 08/31/20   Leroy Sea, MD  carvedilol (COREG) 3.125 MG tablet Take 1 tablet (3.125 mg total) by mouth 2 (two) times daily with a meal. 08/31/20   Leroy Sea, MD  Cholecalciferol 25 MCG (1000 UT) tablet Take 1 tablet (1,000 Units total) by mouth daily. 08/31/20   Leroy Sea, MD  gabapentin (NEURONTIN) 100 MG capsule Take 1 capsule (100 mg total) by mouth 3 (three) times daily. 08/31/20   Leroy Sea, MD  insulin aspart (NOVOLOG) 100 UNIT/ML FlexPen Before each meal 3 times a day, 140-199 - 4 units, 200-250 - 8 units, 251-299 - 12 units,  300-349 - 14 units,  350 or above 20 units. Insulin PEN if approved, provide syringes and needles if needed. 08/31/20   Leroy Sea, MD  insulin detemir (LEVEMIR) 100 UNIT/ML FlexPen Inject 40 Units into the skin daily. 08/31/20   Leroy Sea, MD  Insulin Pen Needle 32G X 4 MM MISC Use as directed with insulin pens 08/31/20   Leroy Sea, MD  isosorbide mononitrate (IMDUR) 30  MG 24 hr tablet Take 1 tablet (30 mg total) by mouth daily. 09/01/20   Leroy SeaSingh, Prashant K, MD  linagliptin (TRADJENTA) 5 MG TABS tablet Take 1 tablet (5 mg total) by mouth daily. Patient not taking: No sig reported 01/04/19   Orpah CobbKadakia, Ajay, MD  tamsulosin (FLOMAX) 0.4 MG CAPS capsule Take 1 capsule (0.4 mg total) by mouth daily after supper. 08/31/20   Leroy SeaSingh, Prashant K, MD  insulin glargine (LANTUS) 100 unit/mL SOPN Inject 30 Units into the skin daily. Patient not taking: No sig reported  08/31/20  [provider]      Allergies    Patient has no known allergies.    Review of Systems   Review of Systems  Constitutional:  Negative for fever.   HENT:  Negative for sore throat.   Eyes:  Negative for visual disturbance.  Respiratory:  Negative for cough and shortness of breath.   Cardiovascular:  Negative for chest pain.  Gastrointestinal:  Positive for abdominal pain and constipation. Negative for diarrhea, dysphagia, nausea and vomiting.  Genitourinary:  Negative for dysuria.  Musculoskeletal:  Positive for falls and gait problem.  Neurological:  Positive for weakness. Negative for headaches.    Physical Exam Updated Vital Signs BP (!) 136/105   Pulse 74   Temp 97.9 F (36.6 C) (Oral)   Resp 16   Ht 6\' 2"  (1.88 m)   Wt 120.2 kg   SpO2 98%   BMI 34.02 kg/m  Physical Exam Vitals and nursing note reviewed.  Constitutional:      General: He is not in acute distress.    Appearance: Normal appearance. He is well-developed.  HENT:     Head: Normocephalic and atraumatic.  Eyes:     Conjunctiva/sclera: Conjunctivae normal.  Cardiovascular:     Rate and Rhythm: Normal rate and regular rhythm.     Heart sounds: No murmur heard. Pulmonary:     Effort: Pulmonary effort is normal. No respiratory distress.     Breath sounds: Normal breath sounds.  Abdominal:     Palpations: Abdomen is soft.     Tenderness: There is no abdominal tenderness.  Musculoskeletal:        General: No swelling.     Cervical back: Neck supple.  Skin:    General: Skin is warm and dry.     Capillary Refill: Capillary refill takes less than 2 seconds.  Neurological:     Mental Status: He is alert.     Motor: Weakness present.     Comments: There is no gross facial asymmetry no dysarthria.  Upper extremity strength seems preserved although lower extremity strength is probably 4 out of 5 on left and 4+ out of 5 on the right     ED Results / Procedures / Treatments   Labs (all labs ordered are listed, but only abnormal results are displayed) Labs Reviewed  CBC - Abnormal; Notable for the following components:      Result Value   WBC 11.6 (*)     All other components within normal limits  DIFFERENTIAL - Abnormal; Notable for the following components:   Neutro Abs 8.3 (*)    Monocytes Absolute 1.4 (*)    Abs Immature Granulocytes 0.09 (*)    All other components within normal limits  COMPREHENSIVE METABOLIC PANEL - Abnormal; Notable for the following components:   Sodium 126 (*)    Chloride 91 (*)    CO2 15 (*)    Glucose, Bld 249 (*)  BUN 41 (*)    Creatinine, Ser 2.92 (*)    Albumin 2.7 (*)    GFR, Estimated 22 (*)    Anion gap 20 (*)    All other components within normal limits  CBG MONITORING, ED - Abnormal; Notable for the following components:   Glucose-Capillary 271 (*)    All other components within normal limits  SARS CORONAVIRUS 2 BY RT PCR  ETHANOL  PROTIME-INR  APTT  RAPID URINE DRUG SCREEN, HOSP PERFORMED  URINALYSIS, ROUTINE W REFLEX MICROSCOPIC  BASIC METABOLIC PANEL  BLOOD GAS, VENOUS  HEMOGLOBIN A1C  BETA-HYDROXYBUTYRIC ACID  TROPONIN I (HIGH SENSITIVITY)  TROPONIN I (HIGH SENSITIVITY)    EKG EKG Interpretation  Date/Time:  Friday May 19 2022 08:24:41 EDT Ventricular Rate:  106 PR Interval:  165 QRS Duration: 87 QT Interval:  322 QTC Calculation: 428 R Axis:   90 Text Interpretation: Sinus tachycardia Borderline right axis deviation Low voltage, precordial leads imcreased rate from prior 7/22 Confirmed by Meridee Score [phone]) on 05/19/2022 8:29:01 AM  Radiology MR BRAIN WO CONTRAST  Result Date: 05/19/2022 CLINICAL DATA:  Stroke suspected EXAM: MRI HEAD WITHOUT CONTRAST TECHNIQUE: Multiplanar, multiecho pulse sequences of the brain and surrounding structures were obtained without intravenous contrast. COMPARISON:  CT Head 08/28/20, 05/20/22. FINDINGS: Limitations: Assessment of the anterior cranial fossa is limited on diffusion weighted and susceptibility weighted imaging due to metallic artifact. Brain: Negative for an acute infarct. No hemorrhage. No hydrocephalus. Chronic left PCA  territory infarct with regions of siderosis. Chronic left cerebellar infarct. No evidence of a parenchymal hematoma. There is ex vacuo dilatation of the left occipital horn. Sequela of severe chronic microvascular ischemic change with generalized volume loss. No extra-axial fluid collection. No mass effect. Vascular: Normal flow voids. Skull and upper cervical spine: Normal marrow signal. Sinuses/Orbits: Bilateral lens replacement. No mastoid or middle ear effusion. Paranasal sinuses are clear. Other: None. IMPRESSION: 1. Within the limitations above, no acute intracranial process. 2. Chronic left PCA and left cerebellar infarcts. Sequela of severe chronic microvascular ischemic change with generalized volume loss. Electronically Signed   By: Lorenza Cambridge M.D.   On: 05/19/2022 15:11   CT ABDOMEN PELVIS WO CONTRAST  Result Date: 05/19/2022 CLINICAL DATA:  Acute abdominal pain. EXAM: CT ABDOMEN AND PELVIS WITHOUT CONTRAST TECHNIQUE: Multidetector CT imaging of the abdomen and pelvis was performed following the standard protocol without IV contrast. RADIATION DOSE REDUCTION: This exam was performed according to the departmental dose-optimization program which includes automated exposure control, adjustment of the mA and/or kV according to patient size and/or use of iterative reconstruction technique. COMPARISON:  08/28/2020 FINDINGS: Lower chest: No acute findings. Hepatobiliary: No mass visualized on this unenhanced exam. Gallbladder is unremarkable. No evidence of biliary ductal dilatation. Pancreas: No mass or inflammatory process visualized on this unenhanced exam. Spleen:  Within normal limits in size. Adrenals/Urinary tract: Moderate right renal parenchymal scarring and atrophy remains stable. 3 mm nonobstructing right renal calculus again seen. No evidence of ureteral calculi or hydronephrosis. Unremarkable unopacified urinary bladder. Stomach/Bowel: No evidence of obstruction, inflammatory process, or  abnormal fluid collections. Normal appendix visualized. Vascular/Lymphatic: No pathologically enlarged lymph nodes identified. No evidence of abdominal aortic aneurysm. Aortic atherosclerotic calcification incidentally noted. Reproductive:  No mass or other significant abnormality. Other:  None. Musculoskeletal:  No suspicious bone lesions identified. IMPRESSION: No acute findings. Tiny nonobstructing right renal calculus. No evidence of ureteral calculi or hydronephrosis. Stable right renal parenchymal scarring and atrophy. Electronically Signed   By: Jonny Ruiz  Geanie CooleyA Stahl M.D.   On: 05/19/2022 12:02   CT HEAD WO CONTRAST  Result Date: 05/19/2022 CLINICAL DATA:  Worsening right-sided weakness. EXAM: CT HEAD WITHOUT CONTRAST TECHNIQUE: Contiguous axial images were obtained from the base of the skull through the vertex without intravenous contrast. RADIATION DOSE REDUCTION: This exam was performed according to the departmental dose-optimization program which includes automated exposure control, adjustment of the mA and/or kV according to patient size and/or use of iterative reconstruction technique. COMPARISON:  CT head dated August 28, 2020. FINDINGS: Brain: No evidence of acute infarction, hemorrhage, hydrocephalus, extra-axial collection or mass lesion/mass effect. Large chronic left PCA territory infarct again noted. Old lacunar infarct in the left cerebellum again noted. Stable atrophy and chronic microvascular ischemic changes. Vascular: Calcified atherosclerosis at the skull base. No hyperdense vessel. Skull: Normal. Negative for fracture or focal lesion. Sinuses/Orbits: No acute finding. Other: None. IMPRESSION: 1. No acute intracranial abnormality. 2. Large chronic left PCA territory infarct. Old left cerebellar infarct. Electronically Signed   By: Obie DredgeWilliam T Derry M.D.   On: 05/19/2022 12:01   DG Chest Port 1 View  Result Date: 05/19/2022 CLINICAL DATA:  72 year old male with weakness, right side body pain. EXAM:  PORTABLE CHEST 1 VIEW COMPARISON:  Portable chest 08/28/2020. FINDINGS: Portable AP upright view at 0848 hours. More kyphotic positioning. Lung volumes and mediastinal contours remain within normal limits. Allowing for portable technique the lungs are clear. Visualized tracheal air column is within normal limits. No pneumothorax or pleural effusion. Negative visible osseous structures. IMPRESSION: Negative portable chest. Electronically Signed   By: Odessa FlemingH  Hall M.D.   On: 05/19/2022 08:56    Procedures Procedures    Medications Ordered in ED Medications  enoxaparin (LOVENOX) injection 30 mg (has no administration in time range)  0.9 %  sodium chloride infusion (has no administration in time range)  insulin aspart (novoLOG) injection 0-9 Units (has no administration in time range)  acetaminophen (TYLENOL) tablet 650 mg (has no administration in time range)  traMADol (ULTRAM) tablet 50 mg (has no administration in time range)  tamsulosin (FLOMAX) capsule 0.4 mg (has no administration in time range)  atorvastatin (LIPITOR) tablet 40 mg (has no administration in time range)  aspirin EC tablet 81 mg (has no administration in time range)  sodium chloride 0.9 % bolus 500 mL (0 mLs Intravenous Stopped 05/19/22 1051)  ondansetron (ZOFRAN) injection 4 mg (4 mg Intravenous Given 05/19/22 0840)  fentaNYL (SUBLIMAZE) injection 50 mcg (50 mcg Intravenous Given 05/19/22 0841)  morphine (PF) 4 MG/ML injection 4 mg (4 mg Intravenous Given 05/19/22 1056)    ED Course/ Medical Decision Making/ A&P Clinical Course as of 05/19/22 1307  Fri May 19, 2022  0902 Chest x-ray interpreted by me as no acute infiltrates.  Awaiting radiology reading. [MB]  1258 Discussed with the family practice teaching service for admission.  They asked if I could get neurology involved for recommendations on if he needs an MRI. [MB]  1306 Reviewed case with Dr. Amada JupiterKirkpatrick neurology for consult.  He did feel an MRI was indicated. [MB]     Clinical Course User Index [MB] Terrilee FilesButler, Theodoros Stjames C, MD                             Medical Decision Making Amount and/or Complexity of Data Reviewed Labs: ordered. Radiology: ordered.  Risk Prescription drug management. Decision regarding hospitalization.   This patient complains of increased weakness and pain on right  side, constipation, poor appetite; this involves an extensive number of treatment Options and is a complaint that carries with it a high risk of complications and morbidity. The differential includes stroke, bleed, dehydration, metabolic derangement, infection, obstruction  I ordered, reviewed and interpreted labs, which included CBC with mildly elevated white count stable hemoglobin, chemistries with low sodium elevated glucose, low bicarb elevated creatinine reflecting some dehydration, troponins flat I ordered medication IV fluids IV pain medication and reviewed PMP when indicated. I ordered imaging studies which included CT head CT abdomen and pelvis, MRI brain and I independently    visualized and interpreted imaging which showed no definite acute findings Additional history obtained from EMS and patient's family members Previous records obtained and reviewed in epic no recent admissions I consulted Dr. Amada Jupiter neurohospitalist and family medicine teaching service and discussed lab and imaging findings and discussed disposition.  Cardiac monitoring reviewed, normal sinus rhythm Social determinants considered, ongoing tobacco use Critical Interventions: None  After the interventions stated above, I reevaluated the patient and found patient's symptoms to be improved but not resolved Admission and further testing considered, he would benefit from admission to the hospital for further workup of his weakness and pain along with his new AKI and hyperglycemia.  Patient in agreement with plan for admission.         Final Clinical Impression(s) / ED  Diagnoses Final diagnoses:  Right sided weakness  AKI (acute kidney injury)  Hyperglycemia    Rx / DC Orders ED Discharge Orders     None         Terrilee Files, MD 05/19/22 1735

## 2022-05-19 NOTE — Progress Notes (Signed)
FMTS Interim Progress Note  S: Pain on right side, no other complaints.  Son brought patient's medications from home.  Medications are as follows, but he has not taken them for approximately 3 weeks: - Trulicity - Lisinopril 20 mg daily - Nortriptyline 50 mg once a day - Tamsulosin 0.4 mg nightly - Carvedilol 12.5 mg twice daily - Gabapentin 100 mg 3 times daily - Atorvastatin 40 mg daily - Jardiance 10 mg daily  O: BP (!) 137/95   Pulse 80   Temp (!) 97.5 F (36.4 C) (Oral)   Resp 17   Ht 6\' 2"  (1.88 m)   Wt 104 kg   SpO2 99%   BMI 29.44 kg/m   General: NAD, resting comfortably in bed Cardiovascular: RRR, no murmurs Respiratory: CTAB, normal work of breathing on room air  A/P: T2DM Elevated BHB, however normal pH and no anion gap, do not believe he is in DKA. - Continue SSI and trend glucose  Neuropathic pain Consider restarting home gabapentin and nortriptyline, caution with patient's age.  Essential hypertension Restarted carvedilol per day team's request.  Holding lisinopril due to AKI, restart later if needed and AKI improves.  Tiffany Kocher, DO 05/19/2022, 9:17 PM PGY-1, [facility] pager [phone]

## 2022-05-19 NOTE — Progress Notes (Signed)
Received from the ER via wheelchair; patient is alert to situation and persons; disoriented to time; oriented to room and unit routine; fall safety reviewed; bed alarmed, low and locked. Son present at the bedside and MD present at the bedside.

## 2022-05-19 NOTE — Assessment & Plan Note (Addendum)
Stable. -Carvedilol 12.5 mg BID    -holding lisinopril due to AKI -Monitor BP   Consider adding  lisinopril today, Scr around his baseline.

## 2022-05-19 NOTE — Assessment & Plan Note (Addendum)
While he was signed out to [place] by the ED as possible DKA. However, patient was just barely acidotic (ph 7.34) and his clinical status has improved markedly with fluid resuscitation. Plus, it sounds like he has not been taking his Jardiance, so the chances of him having (relatively) euglycemic DKA is lower. Given recent history of poor PO intake and hypovolemia on exam, starvation ketosis and dehydration could explain his elevated BHB and lab abnormalities. A1c of 13.3.  - Continue to hold jardiance, hope to restart as AKI resolves and remainder of labs normalize - Will repeat BHB to ensure he is clearing his ketones  - Continue maintenance IVF, NS 131mL/hr - mSSI - Regular diet, encourage p.o. intake

## 2022-05-19 NOTE — ED Notes (Signed)
ED Provider at bedside. 

## 2022-05-19 NOTE — ED Triage Notes (Signed)
Patient arrives in wheelchair by POV c/o worsening right side weakness. Patient states he had a stroke in 2007 causing right sided deficits. States about 2 weeks ago he fell and then approx 1 week ago noticed worsening right sided weakness. Patient also states unable to void or have BM x 2 weeks.

## 2022-05-19 NOTE — ED Notes (Signed)
Patient transported to MRI 

## 2022-05-19 NOTE — ED Notes (Signed)
Patient given urinal and educated on needing specimen.

## 2022-05-19 NOTE — ED Notes (Signed)
Pt given water per Dr. Butler.  

## 2022-05-19 NOTE — ED Notes (Signed)
ED TO INPATIENT HANDOFF REPORT  ED Nurse Name and Phone #:   S Name/Age/Gender [name] 72 y.o. male Room/Bed: 036C/036C  Code Status   Code Status: Full Code  Home/SNF/Other Home Patient oriented to: situation Is this baseline? Yes   Triage Complete: Triage complete  Chief Complaint Hemiplegia [G81.90]  Triage Note Patient arrives in wheelchair by POV c/o worsening right side weakness. Patient states he had a stroke in 2007 causing right sided deficits. States about 2 weeks ago he fell and then approx 1 week ago noticed worsening right sided weakness. Patient also states unable to void or have BM x 2 weeks.    Allergies No Known Allergies  Level of Care/Admitting Diagnosis ED Disposition     ED Disposition  Admit   Condition  --   Comment  Hospital Area: [facility] [[id number]]  Level of Care: Med-Surg [16]  May place patient in observation at [facility] or [facility] if equivalent level of care is available:: No  Covid Evaluation: Confirmed COVID Negative  Diagnosis: Hemiplegia [974163]  Admitting Physician: Eduardo Osier  Attending Physician: MCDIARMID, TODD D [[id number]]          B Medical/Surgery History Past Medical History:  Diagnosis Date   Asthma    Diabetes mellitus without complication    Gout    Heartburn    Hyperlipidemia    Hypertension    Obesity    Stroke    Past Surgical History:  Procedure Laterality Date   KNEE SURGERY  02/2003     A IV Location/Drains/Wounds Patient Lines/Drains/Airways Status     Active Line/Drains/Airways     Name Placement date Placement time Site Days   Peripheral IV 05/19/22 18 G 1.16" Left Antecubital 05/19/22  0820  Antecubital  less than 1            Intake/Output Last 24 hours No intake or output data in the 24 hours ending 05/19/22 1532  Labs/Imaging Results for orders placed or performed during the hospital encounter of 05/19/22 (from the past 48 hour(s))   CBG monitoring, ED     Status: Abnormal   Collection Time: 05/19/22  8:21 AM  Result Value Ref Range   Glucose-Capillary 271 (H) 70 - 99 mg/dL    Comment: Glucose reference range applies only to samples taken after fasting for at least 8 hours.  Ethanol     Status: None   Collection Time: 05/19/22  8:22 AM  Result Value Ref Range   Alcohol, Ethyl (B) <10 <10 mg/dL    Comment: (NOTE) Lowest detectable limit for serum alcohol is 10 mg/dL.  For medical purposes only. Performed at [facility], [address], [place], [place] [address]   Protime-INR     Status: None   Collection Time: 05/19/22  8:22 AM  Result Value Ref Range   Prothrombin Time 13.5 11.4 - 15.2 seconds   INR 1.0 0.8 - 1.2    Comment: (NOTE) INR goal varies based on device and disease states. Performed at [facility], [address] [place], [place] [address]   APTT     Status: None   Collection Time: 05/19/22  8:22 AM  Result Value Ref Range   aPTT 28 24 - 36 seconds    Comment: Performed at [facility], [address] [place], [place] [address]  CBC     Status: Abnormal   Collection Time: 05/19/22  8:22 AM  Result Value Ref Range   WBC 11.6 (H) 4.0 - 10.5 K/uL   RBC 5.28 4.22 - 5.81 MIL/uL   Hemoglobin 15.6 13.0 - 17.0 g/dL   HCT 29.547.4 62.139.0 - 30.852.0 %   MCV 89.8 80.0 - 100.0 fL   MCH 29.5 26.0 - 34.0 pg   MCHC 32.9 30.0 - 36.0 g/dL   RDW 65.713.1 84.611.5 - 96.215.5 %   Platelets 384 150 - 400 K/uL   nRBC 0.0 0.0 - 0.2 %    Comment: Performed at [facility], [address] [place], [place] [address]  Differential     Status: Abnormal   Collection Time: 05/19/22  8:22 AM  Result Value Ref Range   Neutrophils Relative % 71 %   Neutro Abs 8.3 (H) 1.7 - 7.7 K/uL   Lymphocytes Relative 16 %   Lymphs Abs 1.8 0.7 - 4.0 K/uL   Monocytes Relative 12 %   Monocytes Absolute 1.4 (H) 0.1 - 1.0 K/uL   Eosinophils Relative 0 %   Eosinophils Absolute 0.0 0.0 - 0.5 K/uL   Basophils Relative 0 %    Basophils Absolute 0.0 0.0 - 0.1 K/uL   Immature Granulocytes 1 %   Abs Immature Granulocytes 0.09 (H) 0.00 - 0.07 K/uL    Comment: Performed at [facility], [address] [place], [place] [address]  Comprehensive metabolic panel     Status: Abnormal   Collection Time: 05/19/22  8:22 AM  Result Value Ref Range   Sodium 126 (L) 135 - 145 mmol/L   Potassium 5.0 3.5 - 5.1 mmol/L   Chloride 91 (L) 98 - 111 mmol/L   CO2 15 (L) 22 - 32 mmol/L   Glucose, Bld 249 (H) 70 - 99 mg/dL    Comment: Glucose reference range applies only to samples taken after fasting for at least 8 hours.   BUN 41 (H) 8 - 23 mg/dL   Creatinine, Ser 0.102.92 (H) 0.61 - 1.24 mg/dL   Calcium 8.9 8.9 - 27.210.3 mg/dL   Total Protein 7.6 6.5 - 8.1 g/dL   Albumin 2.7 (L) 3.5 - 5.0 g/dL   AST 23 15 - 41 U/L   ALT 21 0 - 44 U/L   Alkaline Phosphatase 126 38 - 126 U/L   Total Bilirubin 0.8 0.3 - 1.2 mg/dL   GFR, Estimated 22 (L) >60 mL/min    Comment: (NOTE) Calculated using the CKD-EPI Creatinine Equation (2021)    Anion gap 20 (H) 5 - 15    Comment: Performed at [facility], [address] [place], [place] [address]  Troponin I (High Sensitivity)     Status: None   Collection Time: 05/19/22  8:22 AM  Result Value Ref Range   Troponin I (High Sensitivity) 14 <18 ng/L    Comment: (NOTE) Elevated high sensitivity troponin I (hsTnI) values and significant  changes across serial measurements may suggest ACS but many other  chronic and acute conditions are known to elevate hsTnI results.  Refer to the "Links" section for chest pain algorithms and additional  guidance. Performed at [facility], [address] [place], [place] [address]   SARS Coronavirus 2 by RT PCR (hospital order, performed in [facility] hospital lab) *cepheid single result test* Anterior Nasal Swab     Status: None   Collection Time: 05/19/22  8:31 AM   Specimen: Anterior Nasal Swab  Result Value Ref Range   SARS Coronavirus 2  by  RT PCR NEGATIVE NEGATIVE    Comment: Performed at [facility], [address] [place], [place] [address]  Troponin I (High Sensitivity)     Status: None   Collection Time: 05/19/22 10:36 AM  Result Value Ref Range   Troponin I (High Sensitivity) 10 <18 ng/L    Comment: (NOTE) Elevated high sensitivity troponin I (hsTnI) values and significant  changes across serial measurements may suggest ACS but many other  chronic and acute conditions are known to elevate hsTnI results.  Refer to the "Links" section for chest pain algorithms and additional  guidance. Performed at [facility], [address] [place], [place] [address]    MR BRAIN WO CONTRAST  Result Date: 05/19/2022 CLINICAL DATA:  Stroke suspected EXAM: MRI HEAD WITHOUT CONTRAST TECHNIQUE: Multiplanar, multiecho pulse sequences of the brain and surrounding structures were obtained without intravenous contrast. COMPARISON:  CT Head 08/28/20, 05/20/22. FINDINGS: Limitations: Assessment of the anterior cranial fossa is limited on diffusion weighted and susceptibility weighted imaging due to metallic artifact. Brain: Negative for an acute infarct. No hemorrhage. No hydrocephalus. Chronic left PCA territory infarct with regions of siderosis. Chronic left cerebellar infarct. No evidence of a parenchymal hematoma. There is ex vacuo dilatation of the left occipital horn. Sequela of severe chronic microvascular ischemic change with generalized volume loss. No extra-axial fluid collection. No mass effect. Vascular: Normal flow voids. Skull and upper cervical spine: Normal marrow signal. Sinuses/Orbits: Bilateral lens replacement. No mastoid or middle ear effusion. Paranasal sinuses are clear. Other: None. IMPRESSION: 1. Within the limitations above, no acute intracranial process. 2. Chronic left PCA and left cerebellar infarcts. Sequela of severe chronic microvascular ischemic change with generalized volume loss. Electronically Signed   By:  Lorenza CambridgeHemant  Desai M.D.   On: 05/19/2022 15:11   CT ABDOMEN PELVIS WO CONTRAST  Result Date: 05/19/2022 CLINICAL DATA:  Acute abdominal pain. EXAM: CT ABDOMEN AND PELVIS WITHOUT CONTRAST TECHNIQUE: Multidetector CT imaging of the abdomen and pelvis was performed following the standard protocol without IV contrast. RADIATION DOSE REDUCTION: This exam was performed according to the departmental dose-optimization program which includes automated exposure control, adjustment of the mA and/or kV according to patient size and/or use of iterative reconstruction technique. COMPARISON:  08/28/2020 FINDINGS: Lower chest: No acute findings. Hepatobiliary: No mass visualized on this unenhanced exam. Gallbladder is unremarkable. No evidence of biliary ductal dilatation. Pancreas: No mass or inflammatory process visualized on this unenhanced exam. Spleen:  Within normal limits in size. Adrenals/Urinary tract: Moderate right renal parenchymal scarring and atrophy remains stable. 3 mm nonobstructing right renal calculus again seen. No evidence of ureteral calculi or hydronephrosis. Unremarkable unopacified urinary bladder. Stomach/Bowel: No evidence of obstruction, inflammatory process, or abnormal fluid collections. Normal appendix visualized. Vascular/Lymphatic: No pathologically enlarged lymph nodes identified. No evidence of abdominal aortic aneurysm. Aortic atherosclerotic calcification incidentally noted. Reproductive:  No mass or other significant abnormality. Other:  None. Musculoskeletal:  No suspicious bone lesions identified. IMPRESSION: No acute findings. Tiny nonobstructing right renal calculus. No evidence of ureteral calculi or hydronephrosis. Stable right renal parenchymal scarring and atrophy. Electronically Signed   By: Danae OrleansJohn A Stahl M.D.   On: 05/19/2022 12:02   CT HEAD WO CONTRAST  Result Date: 05/19/2022 CLINICAL DATA:  Worsening right-sided weakness. EXAM: CT HEAD WITHOUT CONTRAST TECHNIQUE: Contiguous axial  images were obtained from the base of the skull through the vertex without intravenous contrast. RADIATION DOSE REDUCTION: This exam was performed according to the departmental dose-optimization program which  includes automated exposure control, adjustment of the mA and/or kV according to patient size and/or use of iterative reconstruction technique. COMPARISON:  CT head dated August 28, 2020. FINDINGS: Brain: No evidence of acute infarction, hemorrhage, hydrocephalus, extra-axial collection or mass lesion/mass effect. Large chronic left PCA territory infarct again noted. Old lacunar infarct in the left cerebellum again noted. Stable atrophy and chronic microvascular ischemic changes. Vascular: Calcified atherosclerosis at the skull base. No hyperdense vessel. Skull: Normal. Negative for fracture or focal lesion. Sinuses/Orbits: No acute finding. Other: None. IMPRESSION: 1. No acute intracranial abnormality. 2. Large chronic left PCA territory infarct. Old left cerebellar infarct. Electronically Signed   By: Obie Dredge M.D.   On: 05/19/2022 12:01   DG Chest Port 1 View  Result Date: 05/19/2022 CLINICAL DATA:  72 year old male with weakness, right side body pain. EXAM: PORTABLE CHEST 1 VIEW COMPARISON:  Portable chest 08/28/2020. FINDINGS: Portable AP upright view at 0848 hours. More kyphotic positioning. Lung volumes and mediastinal contours remain within normal limits. Allowing for portable technique the lungs are clear. Visualized tracheal air column is within normal limits. No pneumothorax or pleural effusion. Negative visible osseous structures. IMPRESSION: Negative portable chest. Electronically Signed   By: Odessa Fleming M.D.   On: 05/19/2022 08:56    Pending Labs Unresulted Labs (From admission, onward)     Start     Ordered   05/19/22 1423  Blood gas, venous  ONCE - STAT,   STAT        05/19/22 1422   05/19/22 1422  Basic metabolic panel  ONCE - STAT,   STAT        05/19/22 1422   05/19/22 1422   Beta-hydroxybutyric acid  Once,   R        05/19/22 1422   05/19/22 0830  Urine rapid drug screen (hosp performed)  Once,   STAT        05/19/22 0831   05/19/22 0830  Urinalysis, Routine w reflex microscopic -Urine, Clean Catch  Once,   URGENT       Question:  Specimen Source  Answer:  Urine, Clean Catch   05/19/22 0831            Vitals/Pain Today's Vitals   05/19/22 1219 05/19/22 1230 05/19/22 1315 05/19/22 1330  BP:  (!) 158/96    Pulse:  83 77 75  Resp:  12 20 17   Temp:      TempSrc:      SpO2:  100% 100% 100%  Weight:      Height:      PainSc: 9        Isolation Precautions No active isolations  Medications Medications  enoxaparin (LOVENOX) injection 30 mg (has no administration in time range)  sodium chloride 0.9 % bolus 500 mL (0 mLs Intravenous Stopped 05/19/22 1051)  ondansetron (ZOFRAN) injection 4 mg (4 mg Intravenous Given 05/19/22 0840)  fentaNYL (SUBLIMAZE) injection 50 mcg (50 mcg Intravenous Given 05/19/22 0841)  morphine (PF) 4 MG/ML injection 4 mg (4 mg Intravenous Given 05/19/22 1056)    Mobility walks with person assist     Focused Assessments    R Recommendations: See Admitting Provider Note  Report given to:   Additional Notes:

## 2022-05-19 NOTE — H&P (Cosign Needed Addendum)
Hospital Admission History and Physical Service Pager: [phone]  Patient name: [name] Medical record number: [id number] Date of Birth: [birth year] Age: 72 y.o. Gender: male  Primary Care Provider: Orpah Cobb, MD Consultants: Neuro  Code Status: DNR/DNI Preferred Emergency Contact: Son - Sherryl Barters at [phone]  Chief Complaint: "R sided pain"  Assessment and Plan: [name] is a 72 y.o. male with a PMHx of CVA in 2007, MI in 2007, HTN, CKD stage II, T2DM uncontrolled, and HLD presenting with worsening R sided body pain and weaknesses in the setting medication non-adherence and poor PO intake.  Differential for this patient's presentation of this includes, neuropathic pain worsened by uncontrolled T2DM, as he is admittedly noncompliant on his home medications, historically suffers from right sided neuropathic pain since his stroke in 2007. Underlying depression may be playing a role given his diminished appetite and hypersomnia, the recent loss of his sister and a documented past history of depression and anxiety on chart review could support this. CVA not as likely as there are no appreciable focal deficits on exam, CT and MRI also negative for any acute intracranial abnormalities. Underlying malignancy not as likely, no masses found on CT head/neck, CT abdomen, and MRI brain.    * T2DM (type 2 diabetes mellitus) Patient presenting to ED with CMP significant for hyponatremia, hyperglycemia 249, and elevated anion gap at 20.  Suspect these elevated lab values are secondary to medication noncompliance and poor p.o. intake the past couple weeks.  Patient received 1 bolus of NS 500 mL.  BHB, VBG, and repeat BMP ordered to rule out DKA although patient is well-appearing.  Also suspect his uncontrolled diabetes may be exacerbating his neuropathic pain, which appears to be chronic since his stroke in 2007. - Admit to FMTS, attending physician is Dr. McDiarmid - F/u labs for DKA  workup (BMP,  BHB, VBG) - F/u AM Labs CBC, BMP - Maintenance IVF, NS 165mL/hr - Start SSI - Regular diet, encourage p.o. intake - f/u A1C   Neuropathic pain Describes severe hemibody pain along with weakness, chronic in nature but acutely worsened over the past 3 to 4 days.  Pain is not associated with any additional neurological deficits, no appreciable weakness on the right side.  Was given fentanyl injection 1X and morphine 4 mg injection with no improvement in pain.  CT and MRI negative for any intracranial process.  On chart review, patient was admitted for similar presentation in 2015 responded to tramadol. -Start scheduled Tylenol 650 mg every 6 hours -Start tramadol 50 mg every 6 hours as needed for breakthrough pain - Consider adding SNRI such as Effexor 75 mg to address pain and suspected underlying depression.  -PT/OT -Fall precautions  AKI (acute kidney injury) Likely prerenal in the setting of dehydration. Scr 2.9 to (baseline~1.52-1.56).  Hold nephrotoxic medications. -Encourage p.o. intake -IVF maintenance as described above -I/os -Avoid nephrotoxic agents  Hyponatremia Corrected Na of 128. Will monitor. Likely hypovolemia hyponatremia in the setting of poor po intake. -Repeat BMP this evening -mIVF  Abdominal pain Pain in RUQ.  Exam and CT abdomen reassuring.  LFTs nonconcerning.  Does have mild leukocytosis which will be monitored.  He is afebrile with no nausea or vomiting which is also reassuring. -Tylenol scheduled -Tramadol as needed for breakthrough pain -Continue to monitor  Essential hypertension Hypertensive on admission, bp 158/96.  Home meds include carvedilol 12.5 mg twice daily and lisinopril 20 mg daily per pt's family member although there  is no official record of that.  Will hold lisinopril d/t AKI.  -Restart lisinopril later if needed and AKI improves -Continue home carvedilol when med rec is complete and dosage is confirmed.      History of  stroke and MI -Continue atorvastatin and aspirin  BPH -Continue Flomax   FEN/GI: Regular VTE Prophylaxis: Lovenox 30 mg [place] q24h  Disposition: Observation  History of Present Illness:  [name] is a 72 y.o. male with a PMHx of CVA in 2007, MI in 2007, HTN, CKD stage II, T2DM uncontrolled, and HLD presenting with worsening R sided body pain and weaknesses in the setting medication non-adherence and poor PO intake.   Worsening R sided body pain, now at a 9/10 for past 3-4 days. Pain was so bad he wasn't able to go to his little sisters funeral earlier this week. Described as achy, burning and stabbing and he feels weaker than usual. Same pain and weakness he normally has d/t stroke in 2007, just worse. Pt  Son states pt has been sleeping more for past 3 weeks and thinks his pain has been worsened since then. Per son he hasn't been communicating with family like normal (not returning calls).    He admits to mild intermittent abdominal pain. Denies nausea, dizziness, changes in vision, SOB, CP. He is trying to drink more water. Admits to decreased appetite for past few weeks and has lost weight. Denies increased urination. Last BM was 2 weeks ago per pt.    He live alone. Pt states he hasn't been taking ANY of his meds the past 3 weeks. He does not know his medications well.    In the ED, patient reporting 1 wk hx of worsening R sided weakness and pain, was given fentanyl 50 mcg injection. CXR  unremarkable and EKG ordered showing sinus tachycardia. Head CT negative for any acute findings.  MRI brain to definitively rule out CVA.  Was found to be hyponatremic Na 126 and given a bolus of NS 500 mL. BMP is also significant for hyperglycemia at 249 and elevated anion gap of 20.   Review Of Systems: Per HPI with the following additions: no other additions  Pertinent Past Medical History: T2DM CVA  Per chart review there is also documented hx of: HTN CKD 2 HLD Remainder reviewed in  history tab.   Pertinent Past Medical History: MI - 2007 Stroke - 2007 T2DM HTN HLD  Medications: Reports he is not taking but prescribed:  ASA 81 mg daily Atorvastatin 40 mg daily Carvedilol 12.5 mg twice daily Levemir 60 units daily Lisinopril 20 mg daily Nortriptyline 50 mg daily Tamsulosin 0.4 mg nightly, for BPH Trulicity 1.5 mg Vitamin D3 25 mcg daily  ALL: Denies Remainder reviewed in history tab.    Pertinent Past Surgical History: None per pt  Remainder reviewed in history tab.    Pertinent Social History: Tobacco use: Smokes black n milds - 2/day. Previously smoked cigarettes 1/4 PPD since mid 20's (stopped in 2019) Alcohol use: None lately. Before, very little.  Other Substance use: No Lives alone   Pertinent Family History: Mother died from ovarian cancer Younger sister passed away last week from cancer     Remainder reviewed in history tab.   Objective: BP (!) 158/96   Pulse 75   Temp 98.3 F (36.8 C) (Oral)   Resp 17   Ht 6\' 2"  (1.88 m)   Wt 120.2 kg   SpO2 100%   BMI 34.02 kg/m  Exam: General: Elderly male, no acute distress, pleasant and cooperative Eyes: Intact EOM, anicteric ENTM: Oral mucosa moist.  Neck: Supple, no masses or thyromegaly Cardiovascular: RRR, no M/R/G appreciated Respiratory: Normal work of breathing on room air, lung fields clear bilaterally Gastrointestinal: Appears distended, normoactive bowel sounds.  Reports tenderness to palpation on RUQ, no guarding. MSK: ROM grossly intact in all extremities. Derm: Skin dry and warm.  Neuro: AAAOx3 CN I-XII normal,  Psych: Mood and affect congruent and appropriate.  Labs:  CBC BMET  Recent Labs  Lab 05/19/22 0822  WBC 11.6*  HGB 15.6  HCT 47.4  PLT 384   Recent Labs  Lab 05/19/22 0822  NA 126*  K 5.0  CL 91*  CO2 15*  BUN 41*  CREATININE 2.92*  GLUCOSE 249*  CALCIUM 8.9    SARS coronavirus-negative Troponins: 14>>10  EKG: Sinus tachycardia with subtle  right axis deviation.  QTc 428.   Imaging Studies Performed: MR BRAIN WO CONTRAST  Result Date: 05/19/2022 CLINICAL DATA:  Stroke suspected EXAM: MRI HEAD WITHOUT CONTRAST TECHNIQUE: Multiplanar, multiecho pulse sequences of the brain and surrounding structures were obtained without intravenous contrast. COMPARISON:  CT Head 08/28/20, 05/20/22. FINDINGS: Limitations: Assessment of the anterior cranial fossa is limited on diffusion weighted and susceptibility weighted imaging due to metallic artifact. Brain: Negative for an acute infarct. No hemorrhage. No hydrocephalus. Chronic left PCA territory infarct with regions of siderosis. Chronic left cerebellar infarct. No evidence of a parenchymal hematoma. There is ex vacuo dilatation of the left occipital horn. Sequela of severe chronic microvascular ischemic change with generalized volume loss. No extra-axial fluid collection. No mass effect. Vascular: Normal flow voids. Skull and upper cervical spine: Normal marrow signal. Sinuses/Orbits: Bilateral lens replacement. No mastoid or middle ear effusion. Paranasal sinuses are clear. Other: None. IMPRESSION: 1. Within the limitations above, no acute intracranial process. 2. Chronic left PCA and left cerebellar infarcts. Sequela of severe chronic microvascular ischemic change with generalized volume loss. Electronically Signed   By: Lorenza Cambridge M.D.   On: 05/19/2022 15:11   CT ABDOMEN PELVIS WO CONTRAST  Result Date: 05/19/2022 CLINICAL DATA:  Acute abdominal pain. EXAM: CT ABDOMEN AND PELVIS WITHOUT CONTRAST TECHNIQUE: Multidetector CT imaging of the abdomen and pelvis was performed following the standard protocol without IV contrast. RADIATION DOSE REDUCTION: This exam was performed according to the departmental dose-optimization program which includes automated exposure control, adjustment of the mA and/or kV according to patient size and/or use of iterative reconstruction technique. COMPARISON:  08/28/2020  FINDINGS: Lower chest: No acute findings. Hepatobiliary: No mass visualized on this unenhanced exam. Gallbladder is unremarkable. No evidence of biliary ductal dilatation. Pancreas: No mass or inflammatory process visualized on this unenhanced exam. Spleen:  Within normal limits in size. Adrenals/Urinary tract: Moderate right renal parenchymal scarring and atrophy remains stable. 3 mm nonobstructing right renal calculus again seen. No evidence of ureteral calculi or hydronephrosis. Unremarkable unopacified urinary bladder. Stomach/Bowel: No evidence of obstruction, inflammatory process, or abnormal fluid collections. Normal appendix visualized. Vascular/Lymphatic: No pathologically enlarged lymph nodes identified. No evidence of abdominal aortic aneurysm. Aortic atherosclerotic calcification incidentally noted. Reproductive:  No mass or other significant abnormality. Other:  None. Musculoskeletal:  No suspicious bone lesions identified. IMPRESSION: No acute findings. Tiny nonobstructing right renal calculus. No evidence of ureteral calculi or hydronephrosis. Stable right renal parenchymal scarring and atrophy. Electronically Signed   By: Danae Orleans M.D.   On: 05/19/2022 12:02   CT HEAD WO CONTRAST  Result Date:  05/19/2022 CLINICAL DATA:  Worsening right-sided weakness. EXAM: CT HEAD WITHOUT CONTRAST TECHNIQUE: Contiguous axial images were obtained from the base of the skull through the vertex without intravenous contrast. RADIATION DOSE REDUCTION: This exam was performed according to the departmental dose-optimization program which includes automated exposure control, adjustment of the mA and/or kV according to patient size and/or use of iterative reconstruction technique. COMPARISON:  CT head dated August 28, 2020. FINDINGS: Brain: No evidence of acute infarction, hemorrhage, hydrocephalus, extra-axial collection or mass lesion/mass effect. Large chronic left PCA territory infarct again noted. Old lacunar  infarct in the left cerebellum again noted. Stable atrophy and chronic microvascular ischemic changes. Vascular: Calcified atherosclerosis at the skull base. No hyperdense vessel. Skull: Normal. Negative for fracture or focal lesion. Sinuses/Orbits: No acute finding. Other: None. IMPRESSION: 1. No acute intracranial abnormality. 2. Large chronic left PCA territory infarct. Old left cerebellar infarct. Electronically Signed   By: Obie DredgeWilliam T Derry M.D.   On: 05/19/2022 12:01   DG Chest Port 1 View  Result Date: 05/19/2022 CLINICAL DATA:  72 year old male with weakness, right side body pain. EXAM: PORTABLE CHEST 1 VIEW COMPARISON:  Portable chest 08/28/2020. FINDINGS: Portable AP upright view at 0848 hours. More kyphotic positioning. Lung volumes and mediastinal contours remain within normal limits. Allowing for portable technique the lungs are clear. Visualized tracheal air column is within normal limits. No pneumothorax or pleural effusion. Negative visible osseous structures. IMPRESSION: Negative portable chest. Electronically Signed   By: Odessa FlemingH  Hall M.D.   On: 05/19/2022 08:56    I was personally present and re-performed the exam and medical decision making and verified the service and findings are accurately documented in the intern's note.  Erick AlleySarah Imran Nuon, DO 05/19/2022 5:44 PM   Lorri Frederickarrion-Carrero, Margely, MD 05/19/2022, 1:20 PM PGY-1, [facility]  FPTS Intern pager: [phone], text pages welcome Secure chat group [facility]

## 2022-05-19 NOTE — Assessment & Plan Note (Addendum)
Resolved, denies any concern of this today. Abdominal exam unremarkable.

## 2022-05-19 NOTE — Assessment & Plan Note (Signed)
Stable. Likely secondary to hypovolemic hyponatremia in the setting of poor po intake, Na+ 133 today. - trend BMP

## 2022-05-19 NOTE — Assessment & Plan Note (Addendum)
Pain remains stable with gabapentin and effexor. He is currently awaiting SNF place, per RN patient almost fell later in the afternoon and was agreeable to SNF placement. CSW on board and faxing him out.  - Effexor 150 mg daily as weight adjustment  - Gabapentin to 300mg  TID - Tramadol 50mg  q6h PRN for breakthrough pain  - Fall precautions - Dispo: likely home with HHPT when medically stable

## 2022-05-20 ENCOUNTER — Encounter (HOSPITAL_COMMUNITY): Payer: Self-pay | Admitting: Family Medicine

## 2022-05-20 DIAGNOSIS — N179 Acute kidney failure, unspecified: Secondary | ICD-10-CM | POA: Diagnosis not present

## 2022-05-20 DIAGNOSIS — M792 Neuralgia and neuritis, unspecified: Secondary | ICD-10-CM

## 2022-05-20 DIAGNOSIS — E1110 Type 2 diabetes mellitus with ketoacidosis without coma: Secondary | ICD-10-CM

## 2022-05-20 DIAGNOSIS — I69398 Other sequelae of cerebral infarction: Secondary | ICD-10-CM | POA: Diagnosis not present

## 2022-05-20 DIAGNOSIS — E861 Hypovolemia: Secondary | ICD-10-CM

## 2022-05-20 LAB — RENAL FUNCTION PANEL
Albumin: 2 g/dL — ABNORMAL LOW (ref 3.5–5.0)
Anion gap: 8 (ref 5–15)
BUN: 45 mg/dL — ABNORMAL HIGH (ref 8–23)
CO2: 22 mmol/L (ref 22–32)
Calcium: 8 mg/dL — ABNORMAL LOW (ref 8.9–10.3)
Chloride: 101 mmol/L (ref 98–111)
Creatinine, Ser: 2.56 mg/dL — ABNORMAL HIGH (ref 0.61–1.24)
GFR, Estimated: 26 mL/min — ABNORMAL LOW (ref >60)
Glucose, Bld: 192 mg/dL — ABNORMAL HIGH (ref 70–99)
Phosphorus: 2.6 mg/dL (ref 2.5–4.6)
Potassium: 4.8 mmol/L (ref 3.5–5.1)
Sodium: 131 mmol/L — ABNORMAL LOW (ref 135–145)

## 2022-05-20 LAB — GLUCOSE, CAPILLARY
Glucose-Capillary: 229 mg/dL — ABNORMAL HIGH (ref 70–99)
Glucose-Capillary: 133 mg/dL — ABNORMAL HIGH (ref 70–99)
Glucose-Capillary: 212 mg/dL — ABNORMAL HIGH (ref 70–99)
Glucose-Capillary: 150 mg/dL — ABNORMAL HIGH (ref 70–99)
Glucose-Capillary: 157 mg/dL — ABNORMAL HIGH (ref 70–99)

## 2022-05-20 LAB — CBC
HCT: 41 % (ref 39.0–52.0)
Hemoglobin: 14.1 g/dL (ref 13.0–17.0)
MCH: 29.6 pg (ref 26.0–34.0)
MCHC: 34.4 g/dL (ref 30.0–36.0)
MCV: 86.1 fL (ref 80.0–100.0)
Platelets: 351 10*3/uL (ref 150–400)
RBC: 4.76 MIL/uL (ref 4.22–5.81)
RDW: 13 % (ref 11.5–15.5)
WBC: 11.3 10*3/uL — ABNORMAL HIGH (ref 4.0–10.5)
nRBC: 0 % (ref 0.0–0.2)

## 2022-05-20 LAB — BASIC METABOLIC PANEL
Anion gap: 17 — ABNORMAL HIGH (ref 5–15)
BUN: 45 mg/dL — ABNORMAL HIGH (ref 8–23)
CO2: 19 mmol/L — ABNORMAL LOW (ref 22–32)
Calcium: 8.6 mg/dL — ABNORMAL LOW (ref 8.9–10.3)
Chloride: 97 mmol/L — ABNORMAL LOW (ref 98–111)
Creatinine, Ser: 2.77 mg/dL — ABNORMAL HIGH (ref 0.61–1.24)
GFR, Estimated: 23 mL/min — ABNORMAL LOW (ref >60)
Glucose, Bld: 153 mg/dL — ABNORMAL HIGH (ref 70–99)
Potassium: 4.6 mmol/L (ref 3.5–5.1)
Sodium: 133 mmol/L — ABNORMAL LOW (ref 135–145)

## 2022-05-20 LAB — MAGNESIUM: Magnesium: 2.2 mg/dL (ref 1.7–2.4)

## 2022-05-20 LAB — TSH: TSH: 0.449 u[IU]/mL (ref 0.350–4.500)

## 2022-05-20 LAB — BETA-HYDROXYBUTYRIC ACID: Beta-Hydroxybutyric Acid: 0.13 mmol/L (ref 0.05–0.27)

## 2022-05-20 MED ORDER — SENNA 8.6 MG PO TABS
2.0000 | ORAL_TABLET | Freq: Every day | ORAL | Status: DC
  Administered 2022-05-20 – 2022-05-22 (×3): 17.2 mg via ORAL
  Filled 2022-05-20 (×3): qty 2

## 2022-05-20 MED ORDER — POLYETHYLENE GLYCOL 3350 17 G PO PACK
17.0000 g | PACK | Freq: Every day | ORAL | Status: DC
  Administered 2022-05-20 – 2022-05-22 (×3): 17 g via ORAL
  Filled 2022-05-20 (×3): qty 1

## 2022-05-20 MED ORDER — ENOXAPARIN SODIUM 40 MG/0.4ML IJ SOSY
40.0000 mg | PREFILLED_SYRINGE | INTRAMUSCULAR | Status: DC
  Administered 2022-05-21 – 2022-05-24 (×4): 40 mg via SUBCUTANEOUS
  Filled 2022-05-20 (×4): qty 0.4

## 2022-05-20 MED ORDER — GABAPENTIN 100 MG PO CAPS
200.0000 mg | ORAL_CAPSULE | Freq: Three times a day (TID) | ORAL | Status: DC
  Administered 2022-05-20 (×3): 200 mg via ORAL
  Filled 2022-05-20 (×3): qty 2

## 2022-05-20 MED ORDER — INSULIN ASPART 100 UNIT/ML IJ SOLN
0.0000 [IU] | Freq: Three times a day (TID) | INTRAMUSCULAR | Status: DC
  Administered 2022-05-20: 5 [IU] via SUBCUTANEOUS
  Administered 2022-05-20: 2 [IU] via SUBCUTANEOUS
  Administered 2022-05-21: 3 [IU] via SUBCUTANEOUS
  Administered 2022-05-21 (×2): 2 [IU] via SUBCUTANEOUS
  Administered 2022-05-22: 5 [IU] via SUBCUTANEOUS
  Administered 2022-05-22: 3 [IU] via SUBCUTANEOUS
  Administered 2022-05-22: 1 [IU] via SUBCUTANEOUS
  Administered 2022-05-23: 3 [IU] via SUBCUTANEOUS
  Administered 2022-05-23: 2 [IU] via SUBCUTANEOUS
  Administered 2022-05-24 (×2): 3 [IU] via SUBCUTANEOUS
  Administered 2022-05-25: 5 [IU] via SUBCUTANEOUS

## 2022-05-20 MED ORDER — VENLAFAXINE HCL ER 75 MG PO CP24
75.0000 mg | ORAL_CAPSULE | Freq: Every day | ORAL | Status: DC
  Administered 2022-05-20: 75 mg via ORAL
  Filled 2022-05-20: qty 1

## 2022-05-20 MED ORDER — SENNOSIDES-DOCUSATE SODIUM 8.6-50 MG PO TABS: 1.0000 | ORAL_TABLET | Freq: Two times a day (BID) | ORAL | Status: DC

## 2022-05-20 NOTE — Evaluation (Signed)
Occupational Therapy Evaluation Patient Details Name: [name] MRN: [id number] DOB: January 11, 1951 Today's Date: 05/20/2022   History of Present Illness [name] is a 72 y/o male who is admitted 05/19/22 under observation due to R sided pain. He has not been compliant with home meds for past 3 weeks and has had poor P/O intake over that time as well. CT and MRI negative for any intracranial process. PMH includes DM2, CVA (residual R-side weakness & paresthesia), HTN, CKD2, MI, obesity, tobacco use.   Clinical Impression   Pt typically lives alone and is mod I for mobility and ADL. Family sets/organizes his medications, but he still drives and does all his own cooking/cleaning etc. He is a retired Runner, broadcasting/film/video. Today he demonstrates deficits in balance (is not interested in DME despite reaching out for environmental support from furniture and rails in the hallway). He is overall supervision for ADL from a seated position and min guard for standing ADL. At this time recommending a shower seat for safety while bathing, and OT will follow acutely to maximize safety and independence in ADL and functional transfers. Next session focus on pill box test, functional tasks in standing, and education in energy conservation (take handout) as well as education on shower chair for safety.     Recommendations for follow up therapy are one component of a multi-disciplinary discharge planning process, led by the attending physician.  Recommendations may be updated based on patient status, additional functional criteria and insurance authorization.   Assistance Recommended at Discharge Intermittent Supervision/Assistance  Patient can return home with the following A little help with walking and/or transfers;A little help with bathing/dressing/bathroom;Assistance with cooking/housework;Direct supervision/assist for medications management;Direct supervision/assist for financial management;Assist for transportation;Help  with stairs or ramp for entrance    Functional Status Assessment  Patient has had a recent decline in their functional status and demonstrates the ability to make significant improvements in function in a reasonable and predictable amount of time.  Equipment Recommendations  Tub/shower seat    Recommendations for Other Services PT consult     Precautions / Restrictions Precautions Precautions: Fall;Other (comment) Precaution Comments: watch HR Restrictions Weight Bearing Restrictions: No      Mobility Bed Mobility Overal bed mobility: Modified Independent             General bed mobility comments: Increased time    Transfers Overall transfer level: Needs assistance Equipment used: None Transfers: Sit to/from Stand Sit to Stand: Min guard           General transfer comment: Min guard for safety      Balance Overall balance assessment: Needs assistance Sitting-balance support: Feet supported Sitting balance-Leahy Scale: Good     Standing balance support: During functional activity, No upper extremity supported Standing balance-Leahy Scale: Fair                             ADL either performed or assessed with clinical judgement   ADL Overall ADL's : Needs assistance/impaired Eating/Feeding: Supervision/ safety   Grooming: Min guard;Standing   Upper Body Bathing: Min guard;Standing   Lower Body Bathing: Min guard;Sitting/lateral leans   Upper Body Dressing : Set up;Sitting Upper Body Dressing Details (indicate cue type and reason): extra gown like robe Lower Body Dressing: Min guard;Sitting/lateral leans Lower Body Dressing Details (indicate cue type and reason): donning his own shoes (slip on) Toilet Transfer: Minimal assistance;+2 for safety/equipment;Ambulation Toilet Transfer Details (indicate cue type and reason):  frequently reaches out for support from rails on the wall or support from furniture Toileting- ArchitectClothing Manipulation and  Hygiene: Min guard;Sit to/from Nurse, children'sstand     Tub/Shower Transfer Details (indicate cue type and reason): Pt typically stands for showering in the tub - does not have shower chair or grab bars Functional mobility during ADLs: Minimal assistance;+2 for safety/equipment (declines DME for mobility)       Vision Baseline Vision/History: 1 Wears glasses (readers only) Ability to See in Adequate Light: 0 Adequate Patient Visual Report: No change from baseline       Perception     Praxis      Pertinent Vitals/Pain Pain Assessment Pain Location: R side     Hand Dominance     Extremity/Trunk Assessment Upper Extremity Assessment Upper Extremity Assessment: Generalized weakness (history of R sided deficits from previous CVA - however strength symmetrical this session. overall generalized weakness)   Lower Extremity Assessment Lower Extremity Assessment: Defer to PT evaluation RLE Deficits / Details: Hip flexion 3+/5 within limited range, otherwise 5/5 RLE Sensation: history of peripheral neuropathy LLE Deficits / Details: Hip flexion 3+/5 within limited range, otherwise 5/5 LLE Sensation: history of peripheral neuropathy   Cervical / Trunk Assessment Cervical / Trunk Assessment: Normal   Communication Communication Communication: No difficulties   Cognition Arousal/Alertness: Awake/alert Behavior During Therapy: WFL for tasks assessed/performed Overall Cognitive Status: Within Functional Limits for tasks assessed                                 General Comments: Seems to be at baseline     General Comments  fatigues quickly. son present and Pt defers medical decisions to his son for the most part    Exercises     Shoulder Instructions      Home Living Family/patient expects to be discharged to:: Private residence Living Arrangements: Alone Available Help at Discharge: Family;Available PRN/intermittently Type of Home: Apartment Home Access: Level entry      Home Layout: One level     Bathroom Shower/Tub: Chief Strategy OfficerTub/shower unit   Bathroom Toilet: Standard     Home Equipment: Toilet riser          Prior Functioning/Environment Prior Level of Function : Independent/Modified Independent             Mobility Comments: Denies falls, no AD ADLs Comments: Pt family sets up medications; pt reports non compliance recently        OT Problem List: Decreased activity tolerance;Impaired balance (sitting and/or standing);Decreased safety awareness;Decreased knowledge of use of DME or AE;Impaired sensation;Obesity;Pain      OT Treatment/Interventions: Self-care/ADL training;Energy conservation;DME and/or AE instruction;Therapeutic activities;Patient/family education;Balance training    OT Goals(Current goals can be found in the care plan section) Acute Rehab OT Goals Patient Stated Goal: be independent OT Goal Formulation: With patient/family Time For Goal Achievement: 06/03/22 Potential to Achieve Goals: Good ADL Goals Pt Will Perform Grooming: with modified independence;standing Pt Will Perform Upper Body Dressing: with modified independence;sitting Pt Will Perform Lower Body Dressing: with modified independence;sit to/from stand Pt Will Transfer to Toilet: with modified independence;ambulating Pt Will Perform Toileting - Clothing Manipulation and hygiene: with modified independence;sit to/from stand Additional ADL Goal #1: Pt will verbalize 3 stratgies for energy conservation in home setting with no cues from OT Additional ADL Goal #2: Pt will pass pill box test or similiar cognitive assessment implementing cognitive strategies with no cues from OT  OT Frequency: Min 2X/week    Co-evaluation PT/OT/SLP Co-Evaluation/Treatment: Yes Reason for Co-Treatment: For patient/therapist safety;To address functional/ADL transfers PT goals addressed during session: Mobility/safety with mobility OT goals addressed during session: ADL's and  self-care;Strengthening/ROM      AM-PAC OT "6 Clicks" Daily Activity     Outcome Measure Help from another person eating meals?: None Help from another person taking care of personal grooming?: A Little Help from another person toileting, which includes using toliet, bedpan, or urinal?: A Little Help from another person bathing (including washing, rinsing, drying)?: A Little Help from another person to put on and taking off regular upper body clothing?: None Help from another person to put on and taking off regular lower body clothing?: A Little 6 Click Score: 20   End of Session Equipment Utilized During Treatment: Gait belt Nurse Communication: Mobility status;Precautions  Activity Tolerance: Patient tolerated treatment well (but fatigues quickly) Patient left: in bed;with call bell/phone within reach;with bed alarm set;with family/visitor present  OT Visit Diagnosis: Unsteadiness on feet (R26.81);Muscle weakness (generalized) (M62.81);Pain Pain - Right/Left: Right Pain - part of body:  (generalized)                Time: 6770-3403 OT Time Calculation (min): 24 min Charges:  OT General Charges $OT Visit: 1 Visit OT Evaluation $OT Eval Moderate Complexity: 1 Mod  Nyoka Cowden OTR/L Acute Rehabilitation Services Office: [phone]   Evern Bio [facility] 05/20/2022, 10:48 AM

## 2022-05-20 NOTE — Evaluation (Signed)
Physical Therapy Evaluation Patient Details Name: [name] MRN: [id number] DOB: 01-09-1951 Today's Date: 05/20/2022  History of Present Illness  [name] is a 72 y/o male who is admitted 05/19/22 under observation due to R sided pain. He has not been compliant with home meds for past 3 weeks and has had poor P/O intake over that time as well. CT and MRI negative for any intracranial process. PMH includes DM2, CVA (residual R-side weakness & paresthesia), HTN, CKD2, MI, obesity, tobacco use.  Clinical Impression  PTA, pt lives alone in a level entry apartment and is independent with mobility; pt family reports they provide set up assist for medications. Pt presents with generalized weakness, decreased cardiopulmonary endurance and impaired standing balance. Pt ambulating 120 ft with no assistive device and up to min assist for stability. HR 108-140 bpm. Pt declines use of assistive device at this time. Pt would benefit from follow up HHPT to address deficits and maximize functional mobility.     Recommendations for follow up therapy are one component of a multi-disciplinary discharge planning process, led by the attending physician.  Recommendations may be updated based on patient status, additional functional criteria and insurance authorization.         Assistance Recommended at Discharge PRN  Patient can return home with the following  A little help with walking and/or transfers;A little help with bathing/dressing/bathroom;Assistance with cooking/housework;Direct supervision/assist for medications management;Assist for transportation;Help with stairs or ramp for entrance    Equipment Recommendations None recommended by PT (pt declining)  Recommendations for Other Services       Functional Status Assessment Patient has had a recent decline in their functional status and demonstrates the ability to make significant improvements in function in a reasonable and predictable amount of  time.     Precautions / Restrictions Precautions Precautions: Fall;Other (comment) Precaution Comments: watch HR Restrictions Weight Bearing Restrictions: No      Mobility  Bed Mobility Overal bed mobility: Modified Independent             General bed mobility comments: Increased time    Transfers Overall transfer level: Needs assistance Equipment used: None Transfers: Sit to/from Stand Sit to Stand: Min guard           General transfer comment: Min guard for safety    Ambulation/Gait Ambulation/Gait assistance: Min guard, Min assist Gait Distance (Feet): 120 Feet Assistive device: None Gait Pattern/deviations: Step-to pattern, Decreased stride length, Decreased step length - left, Step-through pattern, Scissoring Gait velocity: decreased Gait velocity interpretation: <1.31 ft/sec, indicative of household ambulator   General Gait Details: Pt with decreased bilateral foot clearance, decreased L step length, one episode of scissoring requiring minA to correct, otherwise min guard throughout. Slow and fatigues quickly  Stairs            Wheelchair Mobility    Modified Rankin (Stroke Patients Only)       Balance Overall balance assessment: Needs assistance Sitting-balance support: Feet supported Sitting balance-Leahy Scale: Good     Standing balance support: During functional activity, No upper extremity supported Standing balance-Leahy Scale: Fair                               Pertinent Vitals/Pain Pain Assessment Pain Assessment: 0-10 Pain Score: 8  Pain Location: R side    Home Living Family/patient expects to be discharged to:: Private residence Living Arrangements: Alone Available Help at Discharge: Family;Available PRN/intermittently  Type of Home: Apartment Home Access: Level entry       Home Layout: One level Home Equipment: Toilet riser      Prior Function Prior Level of Function : Independent/Modified  Independent             Mobility Comments: Denies falls, no AD ADLs Comments: Pt family sets up medications; pt reports non compliance recently     Hand Dominance        Extremity/Trunk Assessment   Upper Extremity Assessment Upper Extremity Assessment: Defer to OT evaluation    Lower Extremity Assessment Lower Extremity Assessment: RLE deficits/detail;LLE deficits/detail RLE Deficits / Details: Hip flexion 3+/5 within limited range, otherwise 5/5 RLE Sensation: history of peripheral neuropathy LLE Deficits / Details: Hip flexion 3+/5 within limited range, otherwise 5/5 LLE Sensation: history of peripheral neuropathy       Communication   Communication: No difficulties  Cognition Arousal/Alertness: Awake/alert Behavior During Therapy: WFL for tasks assessed/performed Overall Cognitive Status: Within Functional Limits for tasks assessed                                 General Comments: Seems to be at baseline        General Comments      Exercises     Assessment/Plan    PT Assessment Patient needs continued PT services  PT Problem List Decreased strength;Decreased activity tolerance;Decreased balance;Decreased mobility;Cardiopulmonary status limiting activity       PT Treatment Interventions DME instruction;Gait training;Functional mobility training;Therapeutic activities;Therapeutic exercise;Balance training;Patient/family education    PT Goals (Current goals can be found in the Care Plan section)  Acute Rehab PT Goals Patient Stated Goal: pt agreeable to HHPT PT Goal Formulation: With patient/family Time For Goal Achievement: 06/03/22 Potential to Achieve Goals: Good    Frequency Min 3X/week     Co-evaluation PT/OT/SLP Co-Evaluation/Treatment: Yes Reason for Co-Treatment: For patient/therapist safety;To address functional/ADL transfers PT goals addressed during session: Mobility/safety with mobility         AM-PAC PT "6  Clicks" Mobility  Outcome Measure Help needed turning from your back to your side while in a flat bed without using bedrails?: None Help needed moving from lying on your back to sitting on the side of a flat bed without using bedrails?: None Help needed moving to and from a bed to a chair (including a wheelchair)?: A Little Help needed standing up from a chair using your arms (e.g., wheelchair or bedside chair)?: A Little Help needed to walk in hospital room?: A Little Help needed climbing 3-5 steps with a railing? : A Lot 6 Click Score: 19    End of Session Equipment Utilized During Treatment: Gait belt Activity Tolerance: Patient tolerated treatment well Patient left: in bed;with call bell/phone within reach;with bed alarm set;with family/visitor present Nurse Communication: Mobility status PT Visit Diagnosis: Unsteadiness on feet (R26.81);Difficulty in walking, not elsewhere classified (R26.2)    Time: [id number] PT Time Calculation (min) (ACUTE ONLY): 24 min   Charges:   PT Evaluation $PT Eval Moderate Complexity: 1 Mod          Lillia Paulsaroline Brown, PT, DPT Acute Rehabilitation Services Office [phone]   Norval MortonCarloine T Brown 05/20/2022, 10:04 AM

## 2022-05-20 NOTE — Progress Notes (Addendum)
Daily Progress Note Intern Pager: [phone]  Patient name: [name] Medical record number: [id number] Date of birth: Aug 19, 1950 Age: 72 y.o. Gender: male  Primary Care Provider: Orpah Cobb, MD Consultants: None Code Status: DNR  Pt Overview and Major Events to Date:  4/5- admitted, imaging negative for acute stroke  Assessment and Plan: 71yo male presenting with R sided body pain and weakness in the setting of non-adherence to home medications and poor PO intake. PMH includes CVA in 2007, MI in 2007, uncontrolled DM2, CKD, HLD.   * Neurogenic pain due to central nervous system abnormality following stroke Acute on chronic. Suspect acute component is 2/2 medication non-adherence over several weeks which may be related to depression given depressed mood on presentation. Unclear why he has this chronic pain, ?complex regional pain syndrome vs centralized pain 2/2 stroke in 2014, though he has failed appropriate therapies including TCAs and gabapentinoids. Suspect that though this has been called neuropathic pain, it may more accurately be considered centralized pain and treated as such.  - DC Tylenol as pain does not seem nociceptive and this does not seem to helping - Starting Effexor 75mg  daily - Gabapentin 200mg  TID - Has Tramadol 50mg  q6h ordered for breakthrough pain  -PT/OT -Fall precautions  Ketotic Hyperglycemia While he was signed out to [place] by the ED as possible DKA. However, patient was just barely acidotic (ph 7.34) and his clinical status has improved markedly with fluid resuscitation. Plus, it sounds like he has not been taking his Jardiance, so the chances of him having (relatively) euglycemic DKA is lower. Given recent history of poor PO intake and hypovolemia on exam, starvation ketosis and dehydration could explain his elevated BHB and lab abnormalities. A1c of 13.3.  - Continue to hold jardiance, hope to restart as AKI resolves and remainder of labs  normalize - Will repeat BHB to ensure he is clearing his ketones  - Continue maintenance IVF, NS 1109mL/hr - mSSI - Regular diet, encourage p.o. intake   AKI (acute kidney injury) Likely prerenal in the setting of dehydration. Cr 3.09>2.77. Baseline is ~1.5. On;y UOP documented but he tells me he is making good urine. Hold nephrotoxic medications. - Encourage p.o. intake - IVF maintenance as described above - I/os - Avoid nephrotoxic agents  Hyponatremia Improving. 133 this am. Likely hypovolemic hyponatremia in the setting of poor po intake. -mIVF -Will continue to monitor  Abdominal pain Pain has resolved. Leuokocytosis is stable. CT yesterday unrevealing. Will continue to monitor.   Essential hypertension Acceptable range BP, though still high. Home meds include carvedilol 12.5 mg twice daily and lisinopril 20 mg daily per pt's family member although there is no official record of that.  Will hold lisinopril d/t AKI.  -Restart lisinopril as AKI improves -Continue home carvedilol    History of stroke and MI -Continue atorvastatin and aspirin   BPH -Continue Flomax   FEN/GI: Regular PPx: Lovenox Dispo:Home with home health tomorrow. Barriers include continued improvement in lab value and pain control.   Subjective:  Tells me his R sided pain is essentially unchanged. Not having any abdominal pain today.    Objective: Temp:  [97.5 F (36.4 C)-98.3 F (36.8 C)] 97.6 F (36.4 C) (04/06 1227) Pulse Rate:  [71-86] 71 (04/06 1227) Resp:  [17-18] 18 (04/06 1227) BP: (119-159)/(74-95) 119/74 (04/06 1227) SpO2:  [98 %-100 %] 98 % (04/06 1227) Weight:  [104 kg] 104 kg (04/05 1806) Physical Exam: General: Very pleasant, NAD Cardiovascular:  Regular rate and rhythm, lungs clear  Respiratory: Normal WOB on RA, lungs are clear  Abdomen: Non-tender, non-distended Extremities: Without edema or deformity   Laboratory: Most recent CBC Lab Results  Component Value  Date   WBC 11.3 (H) 05/20/2022   HGB 14.1 05/20/2022   HCT 41.0 05/20/2022   MCV 86.1 05/20/2022   PLT 351 05/20/2022   Most recent BMP    Latest Ref Rng & Units 05/20/2022    4:16 AM  BMP  Glucose 70 - 99 mg/dL 321   BUN 8 - 23 mg/dL 45   Creatinine 2.24 - 1.24 mg/dL 8.25   Sodium 003 - 704 mmol/L 133   Potassium 3.5 - 5.1 mmol/L 4.6   Chloride 98 - 111 mmol/L 97   CO2 22 - 32 mmol/L 19   Calcium 8.9 - 10.3 mg/dL 8.6       Imaging/Diagnostic Tests: No new imaging  Alicia Amel, MD 05/20/2022, 2:22 PM  PGY-2, [facility] FPTS Intern pager: [phone], text pages welcome Secure chat group [facility] Teaching Service

## 2022-05-21 DIAGNOSIS — R2689 Other abnormalities of gait and mobility: Secondary | ICD-10-CM | POA: Diagnosis not present

## 2022-05-21 DIAGNOSIS — F32A Depression, unspecified: Secondary | ICD-10-CM | POA: Diagnosis present

## 2022-05-21 DIAGNOSIS — R1011 Right upper quadrant pain: Secondary | ICD-10-CM | POA: Diagnosis present

## 2022-05-21 DIAGNOSIS — E1165 Type 2 diabetes mellitus with hyperglycemia: Secondary | ICD-10-CM

## 2022-05-21 DIAGNOSIS — Z794 Long term (current) use of insulin: Secondary | ICD-10-CM | POA: Diagnosis not present

## 2022-05-21 DIAGNOSIS — E871 Hypo-osmolality and hyponatremia: Secondary | ICD-10-CM

## 2022-05-21 DIAGNOSIS — K5900 Constipation, unspecified: Secondary | ICD-10-CM | POA: Diagnosis present

## 2022-05-21 DIAGNOSIS — R739 Hyperglycemia, unspecified: Secondary | ICD-10-CM | POA: Diagnosis not present

## 2022-05-21 DIAGNOSIS — E860 Dehydration: Secondary | ICD-10-CM

## 2022-05-21 DIAGNOSIS — E1140 Type 2 diabetes mellitus with diabetic neuropathy, unspecified: Secondary | ICD-10-CM | POA: Diagnosis present

## 2022-05-21 DIAGNOSIS — R109 Unspecified abdominal pain: Secondary | ICD-10-CM | POA: Diagnosis not present

## 2022-05-21 DIAGNOSIS — E1100 Type 2 diabetes mellitus with hyperosmolarity without nonketotic hyperglycemic-hyperosmolar coma (NKHHC): Secondary | ICD-10-CM | POA: Diagnosis present

## 2022-05-21 DIAGNOSIS — W19XXXA Unspecified fall, initial encounter: Secondary | ICD-10-CM | POA: Diagnosis not present

## 2022-05-21 DIAGNOSIS — Z66 Do not resuscitate: Secondary | ICD-10-CM | POA: Diagnosis present

## 2022-05-21 DIAGNOSIS — G4710 Hypersomnia, unspecified: Secondary | ICD-10-CM | POA: Diagnosis present

## 2022-05-21 DIAGNOSIS — F17290 Nicotine dependence, other tobacco product, uncomplicated: Secondary | ICD-10-CM | POA: Diagnosis present

## 2022-05-21 DIAGNOSIS — N179 Acute kidney failure, unspecified: Secondary | ICD-10-CM | POA: Diagnosis present

## 2022-05-21 DIAGNOSIS — Z1152 Encounter for screening for COVID-19: Secondary | ICD-10-CM | POA: Diagnosis not present

## 2022-05-21 DIAGNOSIS — N400 Enlarged prostate without lower urinary tract symptoms: Secondary | ICD-10-CM | POA: Diagnosis present

## 2022-05-21 DIAGNOSIS — N182 Chronic kidney disease, stage 2 (mild): Secondary | ICD-10-CM | POA: Diagnosis present

## 2022-05-21 DIAGNOSIS — I69351 Hemiplegia and hemiparesis following cerebral infarction affecting right dominant side: Secondary | ICD-10-CM | POA: Diagnosis not present

## 2022-05-21 DIAGNOSIS — G8929 Other chronic pain: Secondary | ICD-10-CM | POA: Diagnosis present

## 2022-05-21 DIAGNOSIS — H53461 Homonymous bilateral field defects, right side: Secondary | ICD-10-CM | POA: Diagnosis present

## 2022-05-21 DIAGNOSIS — E861 Hypovolemia: Secondary | ICD-10-CM | POA: Diagnosis present

## 2022-05-21 DIAGNOSIS — E1122 Type 2 diabetes mellitus with diabetic chronic kidney disease: Secondary | ICD-10-CM | POA: Diagnosis present

## 2022-05-21 DIAGNOSIS — Z8673 Personal history of transient ischemic attack (TIA), and cerebral infarction without residual deficits: Secondary | ICD-10-CM | POA: Diagnosis not present

## 2022-05-21 DIAGNOSIS — I69398 Other sequelae of cerebral infarction: Secondary | ICD-10-CM | POA: Diagnosis not present

## 2022-05-21 DIAGNOSIS — Z79899 Other long term (current) drug therapy: Secondary | ICD-10-CM | POA: Diagnosis not present

## 2022-05-21 DIAGNOSIS — I129 Hypertensive chronic kidney disease with stage 1 through stage 4 chronic kidney disease, or unspecified chronic kidney disease: Secondary | ICD-10-CM | POA: Diagnosis present

## 2022-05-21 DIAGNOSIS — E1110 Type 2 diabetes mellitus with ketoacidosis without coma: Secondary | ICD-10-CM | POA: Diagnosis present

## 2022-05-21 DIAGNOSIS — E785 Hyperlipidemia, unspecified: Secondary | ICD-10-CM | POA: Diagnosis present

## 2022-05-21 DIAGNOSIS — G8190 Hemiplegia, unspecified affecting unspecified side: Secondary | ICD-10-CM

## 2022-05-21 LAB — URINALYSIS, ROUTINE W REFLEX MICROSCOPIC
Bilirubin Urine: NEGATIVE
Glucose, UA: NEGATIVE mg/dL
Hgb urine dipstick: NEGATIVE
Ketones, ur: NEGATIVE mg/dL
Leukocytes,Ua: NEGATIVE
Nitrite: NEGATIVE
Protein, ur: NEGATIVE mg/dL
Specific Gravity, Urine: 1.017 (ref 1.005–1.030)
pH: 5 (ref 5.0–8.0)

## 2022-05-21 LAB — CBC WITH DIFFERENTIAL/PLATELET
Abs Immature Granulocytes: 0.07 10*3/uL (ref 0.00–0.07)
Basophils Absolute: 0 10*3/uL (ref 0.0–0.1)
Basophils Relative: 0 %
Eosinophils Absolute: 0 10*3/uL (ref 0.0–0.5)
Eosinophils Relative: 0 %
HCT: 36.5 % — ABNORMAL LOW (ref 39.0–52.0)
Hemoglobin: 12 g/dL — ABNORMAL LOW (ref 13.0–17.0)
Immature Granulocytes: 1 %
Lymphocytes Relative: 8 %
Lymphs Abs: 0.9 10*3/uL (ref 0.7–4.0)
MCH: 29.3 pg (ref 26.0–34.0)
MCHC: 32.9 g/dL (ref 30.0–36.0)
MCV: 89.2 fL (ref 80.0–100.0)
Monocytes Absolute: 0.8 10*3/uL (ref 0.1–1.0)
Monocytes Relative: 8 %
Neutro Abs: 9.1 10*3/uL — ABNORMAL HIGH (ref 1.7–7.7)
Neutrophils Relative %: 83 %
Platelets: 308 10*3/uL (ref 150–400)
RBC: 4.09 MIL/uL — ABNORMAL LOW (ref 4.22–5.81)
RDW: 13.1 % (ref 11.5–15.5)
WBC: 10.9 10*3/uL — ABNORMAL HIGH (ref 4.0–10.5)
nRBC: 0 % (ref 0.0–0.2)

## 2022-05-21 LAB — BASIC METABOLIC PANEL
Anion gap: 7 (ref 5–15)
BUN: 33 mg/dL — ABNORMAL HIGH (ref 8–23)
CO2: 18 mmol/L — ABNORMAL LOW (ref 22–32)
Calcium: 7.7 mg/dL — ABNORMAL LOW (ref 8.9–10.3)
Chloride: 106 mmol/L (ref 98–111)
Creatinine, Ser: 2.09 mg/dL — ABNORMAL HIGH (ref 0.61–1.24)
GFR, Estimated: 33 mL/min — ABNORMAL LOW (ref >60)
Glucose, Bld: 178 mg/dL — ABNORMAL HIGH (ref 70–99)
Potassium: 4.4 mmol/L (ref 3.5–5.1)
Sodium: 131 mmol/L — ABNORMAL LOW (ref 135–145)

## 2022-05-21 LAB — GLUCOSE, CAPILLARY
Glucose-Capillary: 124 mg/dL — ABNORMAL HIGH (ref 70–99)
Glucose-Capillary: 116 mg/dL — ABNORMAL HIGH (ref 70–99)

## 2022-05-21 LAB — RAPID URINE DRUG SCREEN, HOSP PERFORMED
Amphetamines: NOT DETECTED
Barbiturates: NOT DETECTED
Benzodiazepines: NOT DETECTED
Cocaine: NOT DETECTED
Opiates: POSITIVE — AB
Tetrahydrocannabinol: POSITIVE — AB

## 2022-05-21 MED ORDER — TRAMADOL HCL 50 MG PO TABS
100.0000 mg | ORAL_TABLET | ORAL | Status: AC
  Administered 2022-05-21: 100 mg via ORAL
  Filled 2022-05-21: qty 2

## 2022-05-21 MED ORDER — BISACODYL 10 MG RE SUPP
10.0000 mg | Freq: Every day | RECTAL | Status: DC | PRN
  Administered 2022-05-21: 10 mg via RECTAL
  Filled 2022-05-21: qty 1

## 2022-05-21 MED ORDER — VENLAFAXINE HCL ER 75 MG PO CP24
150.0000 mg | ORAL_CAPSULE | Freq: Every day | ORAL | Status: DC
  Administered 2022-05-21 – 2022-05-25 (×5): 150 mg via ORAL
  Filled 2022-05-21 (×5): qty 2

## 2022-05-21 MED ORDER — GABAPENTIN 300 MG PO CAPS
300.0000 mg | ORAL_CAPSULE | Freq: Three times a day (TID) | ORAL | Status: DC
  Administered 2022-05-21 – 2022-05-25 (×13): 300 mg via ORAL
  Filled 2022-05-21 (×13): qty 1

## 2022-05-21 NOTE — Assessment & Plan Note (Signed)
Stable. 09/18/2005 hosp admit:  patient presented with left-sided headache, right-sided weakness and numbness in the upper and lower extremities,  slurred speech and right visual loss MRI: moderate-to-large size acute non- hemorrhagic infarct involving medial and posterior left temporal lobe, left occipital lobe and left thalamus Acute left posterior infarction secondary to left PCA cutoff, thought to be embolic though no source found.

## 2022-05-21 NOTE — Progress Notes (Signed)
Daily Progress Note Intern Pager: [phone]  Patient name: [name] Medical record number: [id number] Date of birth: Jan 18, 1951 Age: 72 y.o. Gender: male  Primary Care Provider: Orpah Cobb, MD Consultants: None Code Status: DNR  Pt Overview and Major Events to Date:  4/5: Admitted   Assessment and Plan: 72yo male presenting with R sided body pain and weakness in the setting of non-adherence to home medications and poor PO intake. Pertinent PMH/PSH includes CVA in 2007, MI in 2007, uncontrolled DM2, CKD, HLD.  * Ketotic Hyperglycemia Recent CBGs 150s, BHB wnl.  -Continue to hold jardiance, hope to restart as AKI resolves and remainder of labs normalize -Continue mIVF -mSSI, adjust insulin as appropriate -regular diet -encourage oral intake   Neurogenic pain due to central nervous system abnormality following stroke Acute on chronic, likely secondary to medication noncompliance and further complicated by depressed mood. Unclear etiology of chronic pain and has failed other therapies. PT evaluation recommended as needed assistance. -Increase Effexor to 150 mg daily as weight adjustment  -Continue Gabapentin 200mg  TID -Continue Tramadol 50mg  q6h ordered for breakthrough pain  -Fall precautions -likely home with HHPT when medically stable  AKI (acute kidney injury) Likely prerenal in the setting of dehydration. Improving with baseline around 1.5.  - Encourage p.o. intake -continue mIVF  -monitor I/Os -avoid nephrotoxic agents -monitor with daily BMP  Hyponatremia Likely secondary to hypovolemic hyponatremia in the setting of poor po intake, pending am BMP this morning. -continue mIVF -continue to monitor with BMP daily  Abdominal pain Resolved, denies any concern of this today. Abdominal exam unremarkable.   Essential hypertension Acceptable range BP, though still high. Home meds include carvedilol 12.5 mg twice daily and lisinopril 20 mg daily per pt's  family member although there is no official record of that.  Will hold lisinopril d/t AKI.  -Restart lisinopril as AKI improves -Continue home carvedilol  -Monitor BP    Other conditions chronic and stable: BPH: Continue flomax History of stroke and MI: continue daily aspirin and atorvastatin     FEN/GI: regular  PPx: lovenox  Dispo:SNF possibly or HHPT pending clinical improvement .   Subjective:  No acute overnight events reported. Patient states that he feels like the effexor may have helped his mood and is open to increasing dose. Denies any concerns at this time.   Objective: Temp:  [97.5 F (36.4 C)-97.8 F (36.6 C)] 97.5 F (36.4 C) (04/07 0327) Pulse Rate:  [53-80] 59 (04/07 0327) Resp:  [18] 18 (04/07 0327) BP: (119-152)/(58-94) 141/69 (04/07 0327) SpO2:  [98 %-100 %] 100 % (04/07 0327) Physical Exam: General: Patient sleeping comfortably in bed, in no acute distress. Cardiovascular: RRR, no murmurs or gallops auscultated Respiratory: CTAB, no wheezing, rales or rhonchi noted Abdomen: soft, nontender, nondistended, presence of bowel sounds Extremities: no LE edema noted bilaterally Neuro: AOX4, 4/5 right UE strength, 5/5 left UE and LE strength bilaterally Psych: mildly flat affect, pleasant   Laboratory: Most recent CBC Lab Results  Component Value Date   WBC 11.3 (H) 05/20/2022   HGB 14.1 05/20/2022   HCT 41.0 05/20/2022   MCV 86.1 05/20/2022   PLT 351 05/20/2022   Most recent BMP    Latest Ref Rng & Units 05/20/2022    3:04 PM  BMP  Glucose 70 - 99 mg/dL 102   BUN 8 - 23 mg/dL 45   Creatinine 7.25 - 1.24 mg/dL 3.66   Sodium 440 - 347 mmol/L 131   Potassium  3.5 - 5.1 mmol/L 4.8   Chloride 98 - 111 mmol/L 101   CO2 22 - 32 mmol/L 22   Calcium 8.9 - 10.3 mg/dL 8.0      Imaging/Diagnostic Tests: No results found.   Reece Leader, DO 05/21/2022, 6:21 AM  PGY-3, [facility] FPTS Intern pager: [phone], text pages welcome Secure  chat group [facility] Teaching Service

## 2022-05-21 NOTE — Hospital Course (Signed)
[name] is a 72 yo male with  a PMHx of CVA in 2007, MI in 2007, HTN, CKD stage II, T2DM uncontrolled, and HLD presenting with worsening R sided body pain and weaknesses in the setting medication non-adherence and poor PO intake.   His hospital course is outlined below:  T2DM (type 2 diabetes mellitus) Patient presenting to ED with CMP significant for hyponatremia, hyperglycemia 249, and elevated anion gap at 20. Likely in the setting of medication noncompliance and poor p.o. intake the past couple weeks. Patient received 1 bolus of NS 500 mL and continued to received maintenance IVF until discharge day of discharge.  Elevated BHB initially at 2.41 and decreasing to 0.138 with normal pH on VBG.  He was placed on SSI and his glucose remained stable with 6 units of Semglee.  His home Jardiance and Trulicity was held due to developing AKI described below.  Stable with plan for close outpatient follow-up for continued management of his diabetes medications.  Neuropathic pain Chronic with acute exacerbations of his right hemisensory pain with a history of left thalamic stroke , suspect if central pain syndrome contributed to his pain.    AKI (acute kidney injury) Likely prerenal in the setting of dehydration, improving with IVF and po intake. Initially Cr 3.09 and improving to 1.78 on day of discharge.    Hyponatremia Presumed precipitant both insulinopenia from non-adherence to his recommended insulin regiment along with decrease by mouth intake due to pain exacerbation.  Improving to 133 on day of discharge.  Concern that his continued poor appetite may exacerbate this when he returns home, plan for close OP follow up.     Abdominal pain Reporting RUQ pain on day of admission, waxing and waning throughout hospitalization.  CT in ED negative for any acute intra-abdominal findings.  Reports some RUQ discomfort on day of discharge, nonconcerning, nontender on palpation.  Denies any nausea,  vomiting, or difficulties eating.    Essential hypertension Hypertensive on admission, bp 158/96.  Home meds include carvedilol 12.5 mg twice daily and lisinopril 20 mg daily per pt's family member although there is no official record of that.  His home lisinopril was held due to his AKI.  Recommend close outpatient follow-up prior to restarting any nephrotoxic agents including lisinopril.  Blood pressures stable on day of discharge, SBP's ranging in the 120s-150s.  Outpatient follow-up recommendations: Repeat BMP at follow up, assess Scr 1.78 at discharge Repeat CBC Reconcile the following medications (held due to AKI and concern for PO intake at discharge) - Jardiance for T2DM - Lisinopril fror HTN

## 2022-05-22 ENCOUNTER — Other Ambulatory Visit (HOSPITAL_COMMUNITY): Payer: Self-pay

## 2022-05-22 ENCOUNTER — Inpatient Hospital Stay (HOSPITAL_COMMUNITY): Payer: Medicare HMO

## 2022-05-22 DIAGNOSIS — R109 Unspecified abdominal pain: Secondary | ICD-10-CM

## 2022-05-22 DIAGNOSIS — Z8673 Personal history of transient ischemic attack (TIA), and cerebral infarction without residual deficits: Secondary | ICD-10-CM

## 2022-05-22 DIAGNOSIS — R739 Hyperglycemia, unspecified: Secondary | ICD-10-CM | POA: Diagnosis not present

## 2022-05-22 LAB — CBC
HCT: 35 % — ABNORMAL LOW (ref 39.0–52.0)
Hemoglobin: 11.5 g/dL — ABNORMAL LOW (ref 13.0–17.0)
MCH: 29.5 pg (ref 26.0–34.0)
MCHC: 32.9 g/dL (ref 30.0–36.0)
MCV: 89.7 fL (ref 80.0–100.0)
Platelets: 315 10*3/uL (ref 150–400)
RBC: 3.9 MIL/uL — ABNORMAL LOW (ref 4.22–5.81)
RDW: 13.3 % (ref 11.5–15.5)
WBC: 10.1 10*3/uL (ref 4.0–10.5)
nRBC: 0 % (ref 0.0–0.2)

## 2022-05-22 LAB — GLUCOSE, CAPILLARY
Glucose-Capillary: 122 mg/dL — ABNORMAL HIGH (ref 70–99)
Glucose-Capillary: 214 mg/dL — ABNORMAL HIGH (ref 70–99)
Glucose-Capillary: 190 mg/dL — ABNORMAL HIGH (ref 70–99)
Glucose-Capillary: 150 mg/dL — ABNORMAL HIGH (ref 70–99)

## 2022-05-22 LAB — BASIC METABOLIC PANEL
Anion gap: 5 (ref 5–15)
BUN: 25 mg/dL — ABNORMAL HIGH (ref 8–23)
CO2: 20 mmol/L — ABNORMAL LOW (ref 22–32)
Calcium: 8 mg/dL — ABNORMAL LOW (ref 8.9–10.3)
Chloride: 108 mmol/L (ref 98–111)
Creatinine, Ser: 1.78 mg/dL — ABNORMAL HIGH (ref 0.61–1.24)
GFR, Estimated: 40 mL/min — ABNORMAL LOW (ref >60)
Glucose, Bld: 128 mg/dL — ABNORMAL HIGH (ref 70–99)
Potassium: 4.6 mmol/L (ref 3.5–5.1)
Sodium: 133 mmol/L — ABNORMAL LOW (ref 135–145)

## 2022-05-22 MED ORDER — INSULIN PEN NEEDLE 32G X 4 MM MISC
0 refills | Status: AC
  Filled 2022-05-22: qty 100, 90d supply, fill #0

## 2022-05-22 MED ORDER — ADULT MULTIVITAMIN W/MINERALS CH
1.0000 | ORAL_TABLET | Freq: Every day | ORAL | Status: DC
  Administered 2022-05-22 – 2022-05-25 (×4): 1 via ORAL
  Filled 2022-05-22 (×4): qty 1

## 2022-05-22 MED ORDER — VENLAFAXINE HCL ER 150 MG PO CP24
150.0000 mg | ORAL_CAPSULE | Freq: Every day | ORAL | 0 refills | Status: DC
  Filled 2022-05-22: qty 30, 30d supply, fill #0

## 2022-05-22 MED ORDER — LIVING WELL WITH DIABETES BOOK
Freq: Once | Status: AC
  Filled 2022-05-22: qty 1

## 2022-05-22 MED ORDER — LACTULOSE 10 GM/15ML PO SOLN
10.0000 g | Freq: Once | ORAL | Status: AC
  Administered 2022-05-22: 10 g via ORAL
  Filled 2022-05-22: qty 30

## 2022-05-22 MED ORDER — INSULIN GLARGINE 100 UNIT/ML SOLOSTAR PEN
6.0000 [IU] | PEN_INJECTOR | Freq: Every day | SUBCUTANEOUS | 0 refills | Status: DC
  Filled 2022-05-22: qty 3, 28d supply, fill #0

## 2022-05-22 MED ORDER — GLUCERNA SHAKE PO LIQD
237.0000 mL | Freq: Three times a day (TID) | ORAL | Status: DC
  Administered 2022-05-22 – 2022-05-25 (×9): 237 mL via ORAL

## 2022-05-22 MED ORDER — TRULICITY 0.75 MG/0.5ML ~~LOC~~ SOAJ
0.7500 mg | SUBCUTANEOUS | 0 refills | Status: DC
  Filled 2022-05-22: qty 2, 28d supply, fill #0

## 2022-05-22 MED ORDER — SENNA 8.6 MG PO TABS
2.0000 | ORAL_TABLET | Freq: Two times a day (BID) | ORAL | Status: DC
  Administered 2022-05-22: 17.2 mg via ORAL
  Filled 2022-05-22: qty 2

## 2022-05-22 MED ORDER — POLYETHYLENE GLYCOL 3350 17 G PO PACK
17.0000 g | PACK | Freq: Two times a day (BID) | ORAL | Status: DC
  Administered 2022-05-22: 17 g via ORAL
  Filled 2022-05-22: qty 1

## 2022-05-22 MED ORDER — TRAMADOL HCL 50 MG PO TABS
50.0000 mg | ORAL_TABLET | Freq: Four times a day (QID) | ORAL | 0 refills | Status: DC | PRN
  Filled 2022-05-22: qty 5, 2d supply, fill #0

## 2022-05-22 MED ORDER — GABAPENTIN 300 MG PO CAPS
300.0000 mg | ORAL_CAPSULE | Freq: Three times a day (TID) | ORAL | 0 refills | Status: DC
  Filled 2022-05-22: qty 90, 30d supply, fill #0

## 2022-05-22 NOTE — Progress Notes (Signed)
Physical Therapy Treatment Patient Details Name: [name] MRN: [id number] DOB: [birth year] Today's Date: 05/22/2022   History of Present Illness [name] is a 72 y/o male who is admitted 05/19/22 under observation due to R sided pain. He has not been compliant with home meds for past 3 weeks and has had poor P/O intake over that time as well. CT and MRI negative for any intracranial process. PMH includes DM2, CVA (residual R-side weakness & paresthesia), HTN, CKD2, MI, obesity, tobacco use.    PT Comments    Pt with improved activity tolerance this session, however, demonstrates decreased dynamic balance without external support. Pt ambulating 200 ft with no assistive device at a min assist level. Multiple episodes of lateral LOB and one episode of R knee buckle.  Pt previously declining RW or cane; would greatly benefit for improved balance and stability. Will continue to follow acutely.    Recommendations for follow up therapy are one component of a multi-disciplinary discharge planning process, led by the attending physician.  Recommendations may be updated based on patient status, additional functional criteria and insurance authorization.  Follow Up Recommendations       Assistance Recommended at Discharge PRN  Patient can return home with the following A little help with walking and/or transfers;A little help with bathing/dressing/bathroom;Assistance with cooking/housework;Direct supervision/assist for medications management;Assist for transportation;Help with stairs or ramp for entrance   Equipment Recommendations  None recommended by PT (pt declining)    Recommendations for Other Services       Precautions / Restrictions Precautions Precautions: Fall Restrictions Weight Bearing Restrictions: No     Mobility  Bed Mobility Overal bed mobility: Modified Independent             General bed mobility comments: Increased time    Transfers Overall transfer  level: Needs assistance Equipment used: None Transfers: Sit to/from Stand Sit to Stand: Min guard           General transfer comment: Min guard for safety    Ambulation/Gait Ambulation/Gait assistance: Min assist Gait Distance (Feet): 200 Feet Assistive device: None Gait Pattern/deviations: Step-to pattern, Decreased stride length, Decreased step length - left, Step-through pattern Gait velocity: decreased     General Gait Details: Pt with multiple episodes of lateral LOB and one episode of R knee buckle, requiring minA to correct. Reaching out for external support   Stairs             Wheelchair Mobility    Modified Rankin (Stroke Patients Only)       Balance Overall balance assessment: Needs assistance Sitting-balance support: Feet supported Sitting balance-Leahy Scale: Good     Standing balance support: During functional activity, No upper extremity supported Standing balance-Leahy Scale: Fair                              Cognition Arousal/Alertness: Awake/alert Behavior During Therapy: WFL for tasks assessed/performed Overall Cognitive Status: Impaired/Different from baseline Area of Impairment: Safety/judgement                         Safety/Judgement: Decreased awareness of deficits, Decreased awareness of safety     General Comments: Decreased awareness of deficits i.e. episodes of loss of balance        Exercises      General Comments        Pertinent Vitals/Pain Pain Assessment Pain Assessment: No/denies pain  Home Living                          Prior Function            PT Goals (current goals can now be found in the care plan section) Acute Rehab PT Goals Patient Stated Goal: pt agreeable to HHPT PT Goal Formulation: With patient/family Time For Goal Achievement: 06/03/22 Potential to Achieve Goals: Good Progress towards PT goals: Progressing toward goals    Frequency    Min  3X/week      PT Plan Current plan remains appropriate    Co-evaluation              AM-PAC PT "6 Clicks" Mobility   Outcome Measure  Help needed turning from your back to your side while in a flat bed without using bedrails?: None Help needed moving from lying on your back to sitting on the side of a flat bed without using bedrails?: None Help needed moving to and from a bed to a chair (including a wheelchair)?: A Little Help needed standing up from a chair using your arms (e.g., wheelchair or bedside chair)?: A Little Help needed to walk in hospital room?: A Little Help needed climbing 3-5 steps with a railing? : A Lot 6 Click Score: 19    End of Session Equipment Utilized During Treatment: Gait belt Activity Tolerance: Patient tolerated treatment well Patient left: in bed;with call bell/phone within reach;with bed alarm set;with family/visitor present Nurse Communication: Mobility status PT Visit Diagnosis: Unsteadiness on feet (R26.81);Difficulty in walking, not elsewhere classified (R26.2)     Time: 1358-1410 PT Time Calculation (min) (ACUTE ONLY): 12 min  Charges:  $Therapeutic Activity: 8-22 mins                     Lillia Pauls, PT, DPT Acute Rehabilitation Services Office [phone]    [name] 05/22/2022, 4:23 PM

## 2022-05-22 NOTE — Progress Notes (Signed)
Patient seen and examined on day of discharge. Will optimize pain control and plan for discharge this afternoon. Will sign discharge summary as available.  Terisa Starr, MD  Family Medicine Teaching Service

## 2022-05-22 NOTE — Plan of Care (Signed)
  Problem: Coping: Goal: Ability to adjust to condition or change in health will improve Outcome: Progressing   Problem: Fluid Volume: Goal: Ability to maintain a balanced intake and output will improve Outcome: Progressing   Problem: Health Behavior/Discharge Planning: Goal: Ability to manage health-related needs will improve Outcome: Progressing   Problem: Nutritional: Goal: Maintenance of adequate nutrition will improve Outcome: Progressing   Problem: Skin Integrity: Goal: Risk for impaired skin integrity will decrease Outcome: Progressing   Problem: Education: Goal: Knowledge of General Education information will improve Description: Including pain rating scale, medication(s)/side effects and non-pharmacologic comfort measures Outcome: Progressing   Problem: Health Behavior/Discharge Planning: Goal: Ability to manage health-related needs will improve Outcome: Progressing

## 2022-05-22 NOTE — Progress Notes (Signed)
Plan had been to discharge patient home today. However, on afternoon assessment the patient tells me that his abdominal pain is worse now than it was this morning. Is an 11/10 per his report. Remains worst over his R flank area. Thankfully he remains afebrile and had an unrevealing CT of the abdomen/pelvis on admission. On exam his abdomen is somewhat distended and is quite tender to palpation, even over the L side. The L sided pain is a bit unusual given that his chronic pain symptoms are all lateralized to the R. He tells me that he does not remember the last time he had a good bowel movement, suspect there may be a component of constipation here.  - Will delay discharge for now until etiology of abdominal pain, especially on the L is better  understood  - KUB - Will increase MiraLax and Senna dosing to BID and give a one time lactulose dose now - If no BM by morning, will give a SMOG enema  J Dorothyann Gibbs, MD

## 2022-05-22 NOTE — Discharge Summary (Deleted)
ADDENDUM 1532 Patient discharge delayed due to worsening abdominal pain, currently being evaluated with KUB. Potential discharge later today vs tomorrow if his abdominal pain can be adequately worked up and addressed. High suspicion this is constipation-related.   George Mccoy, MD      Family Medicine Teaching [facility] Discharge Summary  Patient name: [name] Medical record number: [id number] Date of birth: 1950-03-26 Age: 72 y.o. Gender: male Date of Admission: 05/19/2022  Date of Discharge: 05/22/22  Admitting Physician: George Roach McDiarmid, MD  Primary Care Provider: Orpah Cobb, MD Consultants: None  Indication for Hospitalization: Type 2 diabetes mellitus with hyperosmolarity without coma, with long-term current use of insulin   Discharge Diagnoses/Problem List:  Principal Problem for Admission:  Other Problems addressed during stay:  Principal Problem:   Ketotic Hyperglycemia Active Problems:   Essential hypertension   Dehydration   History of CVA (cerebrovascular accident)   AKI (acute kidney injury)   Neurogenic 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   His hospital course is outlined below:  T2DM (type 2 diabetes mellitus) Patient presenting to ED with CMP significant for hyponatremia, hyperglycemia 249, and elevated anion gap at 20. Likely in the setting of medication noncompliance and poor p.o. intake the past couple weeks. Patient received 1 bolus of NS 500 mL and continued to received maintenance IVF until discharge day of discharge.  Elevated BHB initially at 2.41 and decreasing to 0.138 with normal pH on VBG.  He  was placed on SSI and his glucose remained stable with 6 units of Semglee.  His home Jardiance and Trulicity was held due to developing AKI described below.  Stable with plan for close outpatient follow-up for continued management of his diabetes medications.  Neuropathic pain Chronic with acute exacerbations of his right hemisensory pain with a history of left thalamic stroke , suspect if central pain syndrome contributed to his pain.    AKI (acute kidney injury) Likely prerenal in the setting of dehydration, improving with IVF and po intake. Initially Cr 3.09 and improving to 1.78 on day of discharge.    Hyponatremia Presumed precipitant both insulinopenia from non-adherence to his recommended insulin regiment along with decrease by mouth intake due to pain exacerbation.  Improving to 133 on day of discharge.  Concern that his continued poor appetite may exacerbate this when he returns home, plan for close OP follow up.     Abdominal pain Reporting RUQ pain on day of admission, waxing and waning throughout hospitalization.  CT in ED negative for any acute intra-abdominal findings.  Reports some RUQ discomfort on day of discharge, nonconcerning, nontender on palpation.  Denies any nausea, vomiting, or difficulties eating.    Essential hypertension Hypertensive on admission, bp 158/96.  Home meds include carvedilol 12.5 mg twice daily and lisinopril 20 mg daily per pt's family member although there is no official record of that.  His home lisinopril was held due to his AKI.  Recommend close outpatient follow-up prior to restarting any nephrotoxic agents including lisinopril.  Blood pressures stable on day of discharge, SBP's ranging in the 120s-150s.  Outpatient follow-up recommendations:  Repeat BMP at follow up, assess Scr 1.78 at discharge Repeat CBC Reconcile the following medications (held due to AKI and concern for PO intake at discharge) - Jardiance for T2DM - Lisinopril fror HTN      Disposition: Home  Discharge Condition: Stable   Discharge Exam:  Vitals:   05/22/22 0308 05/22/22 1148  BP: (!) 151/78 124/70  Pulse: 65 82  Resp: 18 16  Temp: 97.6 F (36.4 C) 98.3 F (36.8 C)  SpO2: 100% 99%   General: NAD, resting comfortably Cardiovascular: RRR, no m/r/g appreciated Respiratory: CTAB, no wheezing or ronchi appreciated Abdomen: RUQ discomfort, non-tender to palpation, normoactive bowel sounds Extremities: ROM normal in all extremities Neuro: AOX4, 4/5 right UE strength, 5/5 left UE and LE strength bilaterally Psych: mildly flat affect, pleasant   Significant Procedures: None  Significant Labs and Imaging:  Recent Labs  Lab 05/21/22 1037 05/22/22 0457  WBC 10.9* 10.1  HGB 12.0* 11.5*  HCT 36.5* 35.0*  PLT 308 315   Recent Labs  Lab 05/20/22 1504 05/21/22 1037 05/22/22 0457  NA 131* 131* 133*  K 4.8 4.4 4.6  CL 101 106 108  CO2 22 18* 20*  GLUCOSE 192* 178* 128*  BUN 45* 33* 25*  CREATININE 2.56* 2.09* 1.78*  CALCIUM 8.0* 7.7* 8.0*  MG 2.2  --   --   PHOS 2.6  --   --   ALBUMIN 2.0*  --   --     MR BRAIN WO CONTRAST  Result Date: 05/19/2022 CLINICAL DATA:  Stroke suspected EXAM: MRI HEAD WITHOUT CONTRAST TECHNIQUE: Multiplanar, multiecho pulse sequences of the brain and surrounding structures were obtained without intravenous contrast. COMPARISON:  CT Head 08/28/20, 05/20/22. FINDINGS: Limitations: Assessment of the anterior cranial fossa is limited on diffusion weighted and susceptibility weighted imaging due to metallic artifact. Brain: Negative for an acute infarct. No hemorrhage. No hydrocephalus. Chronic left PCA territory infarct with regions of siderosis. Chronic left cerebellar infarct. No evidence of a parenchymal hematoma. There is ex vacuo dilatation of the left occipital horn. Sequela of severe chronic microvascular ischemic change with generalized volume loss. No extra-axial fluid collection. No mass effect. Vascular: Normal  flow voids. Skull and upper cervical spine: Normal marrow signal. Sinuses/Orbits: Bilateral lens replacement. No mastoid or middle ear effusion. Paranasal sinuses are clear. Other: None. IMPRESSION: 1. Within the limitations above, no acute intracranial process. 2. Chronic left PCA and left cerebellar infarcts. Sequela of severe chronic microvascular ischemic change with generalized volume loss. Electronically Signed   By: Lorenza Cambridge M.D.   On: 05/19/2022 15:11   CT ABDOMEN PELVIS WO CONTRAST  Result Date: 05/19/2022 CLINICAL DATA:  Acute abdominal pain. EXAM: CT ABDOMEN AND PELVIS WITHOUT CONTRAST TECHNIQUE: Multidetector CT imaging of the abdomen and pelvis was performed following the standard protocol without IV contrast. RADIATION DOSE REDUCTION: This exam was performed according to the departmental dose-optimization program which includes automated exposure control, adjustment of the mA and/or kV according to patient size and/or use of iterative reconstruction technique. COMPARISON:  08/28/2020 FINDINGS: Lower chest: No acute findings. Hepatobiliary: No mass visualized on this unenhanced exam. Gallbladder is unremarkable. No evidence of biliary ductal dilatation. Pancreas: No mass or inflammatory process visualized on this unenhanced exam. Spleen:  Within normal limits in size. Adrenals/Urinary tract: Moderate right renal parenchymal scarring and atrophy remains stable. 3 mm nonobstructing right renal calculus again seen. No evidence of ureteral calculi or hydronephrosis. Unremarkable unopacified urinary bladder. Stomach/Bowel: No evidence of obstruction, inflammatory process,  or abnormal fluid collections. Normal appendix visualized. Vascular/Lymphatic: No pathologically enlarged lymph nodes identified. No evidence of abdominal aortic aneurysm. Aortic atherosclerotic calcification incidentally noted. Reproductive:  No mass or other significant abnormality. Other:  None. Musculoskeletal:  No suspicious  bone lesions identified. IMPRESSION: No acute findings. Tiny nonobstructing right renal calculus. No evidence of ureteral calculi or hydronephrosis. Stable right renal parenchymal scarring and atrophy. Electronically Signed   By: Danae Orleans M.D.   On: 05/19/2022 12:02   CT HEAD WO CONTRAST  Result Date: 05/19/2022 CLINICAL DATA:  Worsening right-sided weakness. EXAM: CT HEAD WITHOUT CONTRAST TECHNIQUE: Contiguous axial images were obtained from the base of the skull through the vertex without intravenous contrast. RADIATION DOSE REDUCTION: This exam was performed according to the departmental dose-optimization program which includes automated exposure control, adjustment of the mA and/or kV according to patient size and/or use of iterative reconstruction technique. COMPARISON:  CT head dated August 28, 2020. FINDINGS: Brain: No evidence of acute infarction, hemorrhage, hydrocephalus, extra-axial collection or mass lesion/mass effect. Large chronic left PCA territory infarct again noted. Old lacunar infarct in the left cerebellum again noted. Stable atrophy and chronic microvascular ischemic changes. Vascular: Calcified atherosclerosis at the skull base. No hyperdense vessel. Skull: Normal. Negative for fracture or focal lesion. Sinuses/Orbits: No acute finding. Other: None. IMPRESSION: 1. No acute intracranial abnormality. 2. Large chronic left PCA territory infarct. Old left cerebellar infarct. Electronically Signed   By: Obie Dredge M.D.   On: 05/19/2022 12:01   DG Chest Port 1 View  Result Date: 05/19/2022 CLINICAL DATA:  72 year old male with weakness, right side body pain. EXAM: PORTABLE CHEST 1 VIEW COMPARISON:  Portable chest 08/28/2020. FINDINGS: Portable AP upright view at 0848 hours. More kyphotic positioning. Lung volumes and mediastinal contours remain within normal limits. Allowing for portable technique the lungs are clear. Visualized tracheal air column is within normal limits. No  pneumothorax or pleural effusion. Negative visible osseous structures. IMPRESSION: Negative portable chest. Electronically Signed   By: Odessa Fleming M.D.   On: 05/19/2022 08:56    Results/Tests Pending at Time of Discharge: None  Discharge Medications:  Allergies as of 05/22/2022   No Known Allergies      Medication List     STOP taking these medications    Jardiance 10 MG Tabs tablet Generic drug: empagliflozin   Levemir FlexTouch 100 UNIT/ML FlexPen Generic drug: insulin detemir   lisinopril 20 MG tablet Commonly known as: ZESTRIL   nortriptyline 25 MG capsule Commonly known as: PAMELOR   nortriptyline 50 MG capsule Commonly known as: PAMELOR   NovoLOG FlexPen 100 UNIT/ML FlexPen Generic drug: insulin aspart   Trulicity 1.5 MG/0.5ML Sopn Generic drug: Dulaglutide Replaced by: Trulicity 0.75 MG/0.5ML Sopn       TAKE these medications    acetaminophen 325 MG tablet Commonly known as: TYLENOL Take 2 tablets (650 mg total) by mouth every 6 (six) hours as needed for moderate pain.   Aspirin Low Dose 81 MG tablet Generic drug: aspirin EC Take 1 tablet (81 mg total) by mouth daily. Swallow whole.   atorvastatin 40 MG tablet Commonly known as: LIPITOR Take 1 tablet (40 mg total) by mouth daily.   BD Pen Needle Nano U/F 32G X 4 MM Misc Generic drug: Insulin Pen Needle Use as directed with insulin pens   carvedilol 12.5 MG tablet Commonly known as: COREG Take 12.5 mg by mouth 2 (two) times daily with a meal.   gabapentin 300 MG capsule Commonly known  as: NEURONTIN Take 1 capsule (300 mg total) by mouth 3 (three) times daily. What changed:  medication strength how much to take   Lantus SoloStar 100 UNIT/ML Solostar Pen Generic drug: insulin glargine Inject 6 Units into the skin at bedtime. Expires 28 days after first use   tamsulosin 0.4 MG Caps capsule Commonly known as: FLOMAX Take 1 capsule (0.4 mg total) by mouth daily after supper.   traMADol 50 MG  tablet Commonly known as: ULTRAM Take 1 tablet (50 mg total) by mouth every 6 (six) hours as needed for severe pain (for breakthrough pain).   Trulicity 0.75 MG/0.5ML Sopn Generic drug: Dulaglutide Inject 0.75 mg into the skin once a week. Replaces: Trulicity 1.5 MG/0.5ML Sopn   Trulicity 0.75 MG/0.5ML Sopn Generic drug: Dulaglutide Inject 0.75 mg into the skin once a week.   venlafaxine XR 150 MG 24 hr capsule Commonly known as: EFFEXOR-XR Take 1 capsule (150 mg total) by mouth daily. Start taking on: May 23, 2022         Discharge Instructions: Please refer to Patient Instructions section of EMR for full details.  Patient was counseled important signs and symptoms that should prompt return to medical care, changes in medications, dietary instructions, activity restrictions, and follow up appointments.   Follow-Up Appointments:  Follow-up Information     Elberta FortisHernandez, Katie, MD Follow up on 05/26/2022.   Specialty: Family Medicine Why: You have a hospital follow up on 4/12 at 10:15am; please arrive 15 minutes early Contact information: [address] [place] [address] [phone]         Alicia AmelSanford, Ofelia Podolski B, MD Follow up on 06/06/2022.   Specialty: Family Medicine Why: You have a new patient visit with Dr. Marisue HumbleSanford on 4/23 at 2:10pm; please arrive 15 minutes early Contact information: [address] [place] [address] [phone]         [facility].   Specialty: Home Health Services Why: The home health agency will contact you for the first home visit. Contact information: [address] [place] [address] [phone]                 Lorri Frederickarrion-Carrero, Margely, MD 05/22/2022, 12:27 PM PGY-1, [facility]

## 2022-05-22 NOTE — Progress Notes (Signed)
Initial Nutrition Assessment  DOCUMENTATION CODES:   Not applicable  INTERVENTION:  Adjust diet to carb modified for glucose management MVI with minerals daily Glucerna Shake po TID, each supplement provides 220 kcal and 10 grams of protein  NUTRITION DIAGNOSIS:   Inadequate oral intake related to poor appetite as evidenced by per patient/family report.  GOAL:   Patient will meet greater than or equal to 90% of their needs  MONITOR:   PO intake, Supplement acceptance, I & O's, Weight trends, Labs  REASON FOR ASSESSMENT:   Malnutrition Screening Tool    ASSESSMENT:   Pt with hx of HTN, HLD, CKD 2, DM type 2, gout, and hx CVA and MI presented to ED with constipation, right-sided weakness and pain.  Pt resting in bed at the time of assessment. States that for about 3 weeks he has had a poor appetite. Endorses weight loss as well. Most recent A1c 13.3% which is likely a contributing factor to weight loss.   Pt agreeable to glucerna supplements to aid in meeting nutrition needs.   Average Meal Intake: 4/6-4/7: 44% intake x 6 recorded meals  Nutritionally Relevant Medications: Scheduled Meds:  atorvastatin  40 mg Oral Daily   insulin aspart  0-15 Units Subcutaneous TID WC   polyethylene glycol  17 g Oral Daily   senna  2 tablet Oral Daily   Continuous Infusions:  sodium chloride 125 mL/hr at 05/22/22 0139   PRN Meds:.bisacodyl  Labs Reviewed: Na 133 BUN 25, creatinine 1.78 CBG ranges from 116-214 mg/dL over the last 24 hours  NUTRITION - FOCUSED PHYSICAL EXAM: Flowsheet Row Most Recent Value  Orbital Region No depletion  Upper Arm Region Mild depletion  Thoracic and Lumbar Region No depletion  Buccal Region No depletion  Temple Region Mild depletion  Clavicle Bone Region No depletion  Clavicle and Acromion Bone Region No depletion  Scapular Bone Region No depletion  Dorsal Hand Mild depletion  Patellar Region Mild depletion  Anterior Thigh Region Mild  depletion  Posterior Calf Region Moderate depletion  Edema (RD Assessment) None  Hair Reviewed  Eyes Reviewed  Mouth Reviewed  Skin Reviewed  Nails Reviewed    Diet Order:   Diet Order             Diet Carb Modified Fluid consistency: Thin; Room service appropriate? Yes with Assist  Diet effective now                   EDUCATION NEEDS:   Education needs have been addressed  Skin:  Skin Assessment: Reviewed RN Assessment  Last BM:  unsure  Height:   Ht Readings from Last 1 Encounters:  05/19/22 6\' 2"  (1.88 m)    Weight:   Wt Readings from Last 1 Encounters:  05/19/22 104 kg    Ideal Body Weight:  86.4 kg  BMI:  Body mass index is 29.44 kg/m.  Estimated Nutritional Needs:   Kcal:  2100-2300 kcal/d  Protein:  105-120 g/d  Fluid:  2.2L/d    Greig Castilla, RD, LDN Clinical Dietitian RD pager # available in [facility]  After hours/weekend pager # available in [facility]

## 2022-05-22 NOTE — TOC Transition Note (Signed)
Transition of Care [facility]) - CM/SW Discharge Note   Patient Details  Name: [name] MRN: [id number] Date of Birth: 1950/10/17  [facility] CM/SW Contact:  Kermit Balo, RN Phone Number: 05/22/2022, 12:40 PM   Clinical Narrative:   Pt is from home alone but has a significant other that can check on him.  Pt is discharging home with home health services through [facility]. Information on the AVS.  Pt drives self as needed but his Significant other can assist.  Pt manages his own medications and he denies any issues. [facility]acy of choice[facility] [address] Pt has transportation home.   Final next level of care: Home w Home Health Services Barriers to Discharge: No Barriers Identified   Patient Goals and CMS Choice CMS Medicare.gov Compare Post Acute Care list provided to:: Patient Choice offered to / list presented to : Patient  Discharge Placement                         Discharge Plan and Services Additional resources added to the After Visit Summary for                            [facility] Arranged: PT, RN [facility] Agency: [facility] Date [facility] Agency Contacted: 05/22/22   Representative spoke with at [facility] Agency: Kandee Keen  Social Determinants of Health (SDOH) Interventions SDOH Screenings   Food Insecurity: No Food Insecurity (05/19/2022)  Housing: Low Risk  (05/19/2022)  Transportation Needs: No Transportation Needs (05/19/2022)  Utilities: Not At Risk (05/19/2022)  Tobacco Use: High Risk (05/20/2022)     Readmission Risk Interventions     No data to display

## 2022-05-23 DIAGNOSIS — N179 Acute kidney failure, unspecified: Secondary | ICD-10-CM | POA: Diagnosis not present

## 2022-05-23 LAB — GLUCOSE, CAPILLARY
Glucose-Capillary: 177 mg/dL — ABNORMAL HIGH (ref 70–99)
Glucose-Capillary: 130 mg/dL — ABNORMAL HIGH (ref 70–99)
Glucose-Capillary: 117 mg/dL — ABNORMAL HIGH (ref 70–99)
Glucose-Capillary: 178 mg/dL — ABNORMAL HIGH (ref 70–99)
Glucose-Capillary: 135 mg/dL — ABNORMAL HIGH (ref 70–99)
Glucose-Capillary: 171 mg/dL — ABNORMAL HIGH (ref 70–99)

## 2022-05-23 MED ORDER — POLYETHYLENE GLYCOL 3350 17 G PO PACK
17.0000 g | PACK | Freq: Every day | ORAL | Status: DC
  Administered 2022-05-24: 17 g via ORAL
  Filled 2022-05-23: qty 1

## 2022-05-23 MED ORDER — SENNA 8.6 MG PO TABS
2.0000 | ORAL_TABLET | Freq: Every day | ORAL | Status: DC
  Administered 2022-05-24: 17.2 mg via ORAL
  Filled 2022-05-23: qty 2

## 2022-05-23 NOTE — [place] FL2 (Addendum)
[place] MEDICAID FL2 LEVEL OF CARE FORM     IDENTIFICATION  Patient Name: [name] Birthdate: 06-15-1950 Sex: male Admission Date (Current Location): 05/19/2022  [facility] and [place] Number:  Producer, television/film/video and Address:  [facility] [place], [address], [place], [place] [address]      Provider Number: [id number]  Attending Physician Name and Address:  McDiarmid, Leighton Roach, MD  Relative Name and Phone Number:       Current Level of Care: Hospital Recommended Level of Care: Skilled Nursing Facility Prior Approval Number:    Date Approved/Denied:   PASRR Number:    Discharge Plan: SNF    Current Diagnoses: Patient Active Problem List   Diagnosis Date Noted   Hemiplegia 05/21/2022   Neurogenic pain due to central nervous system abnormality following stroke 05/19/2022   Hyponatremia 05/19/2022   Abdominal pain 05/19/2022   History of CVA (cerebrovascular accident) 08/28/2020   Paresthesia, chronic 08/28/2020   AKI (acute kidney injury) 08/28/2020   Gait abnormality 12/31/2018   Dehydration 08/31/2016   Ketotic Hyperglycemia 10/12/2006   DEPRESSIVE DISORDER, MAJOR RCR, UNSPECIFIED 10/12/2006   TOBACCO USE 10/12/2006   Essential hypertension 10/12/2006   GRAVES DISEASE 04/12/2006   IMPOTENCE INORGANIC 04/12/2006    Orientation RESPIRATION BLADDER Height & Weight     Self, Time, Situation, Place  Normal Continent Weight: 104 kg Height:  6\' 2"  (188 cm)  BEHAVIORAL SYMPTOMS/MOOD NEUROLOGICAL BOWEL NUTRITION STATUS      Continent Diet (Carb modified with thin liquids)  AMBULATORY STATUS COMMUNICATION OF NEEDS Skin   Limited Assist Verbally Normal                       Personal Care Assistance Level of Assistance  Bathing, Feeding, Dressing Bathing Assistance: Limited assistance Feeding assistance: Independent Dressing Assistance: Limited assistance     Functional Limitations Info  Sight, Hearing, Speech Sight Info:  Adequate Hearing Info: Adequate Speech Info: Adequate    SPECIAL CARE FACTORS FREQUENCY  PT (By licensed PT), OT (By licensed OT)     PT Frequency: 5x/wk OT Frequency: 5x/wk            Contractures Contractures Info: Not present    Additional Factors Info  Code Status, Allergies, Psychotropic, Insulin Sliding Scale Code Status Info: DNR Allergies Info: NKA Psychotropic Info: Neurontin 300 mg TID/ Effexor XR 150 mg daily Insulin Sliding Scale Info: Novolog 0-15 units SQ three times a day       Current Medications (05/23/2022):  This is the current hospital active medication list Current Facility-Administered Medications  Medication Dose Route Frequency Provider Last Rate Last Admin   aspirin EC tablet 81 mg  81 mg Oral Daily Erick Alley, DO   81 mg at 05/23/22 0825   atorvastatin (LIPITOR) tablet 40 mg  40 mg Oral Daily Erick Alley, DO   40 mg at 05/23/22 0973   bisacodyl (DULCOLAX) suppository 10 mg  10 mg Rectal Daily PRN McDiarmid, Leighton Roach, MD   10 mg at 05/21/22 1059   carvedilol (COREG) tablet 12.5 mg  12.5 mg Oral BID WC Tiffany Kocher, DO   12.5 mg at 05/23/22 0825   enoxaparin (LOVENOX) injection 40 mg  40 mg Subcutaneous Q24H McDiarmid, Leighton Roach, MD   40 mg at 05/22/22 1637   feeding supplement (GLUCERNA SHAKE) (GLUCERNA SHAKE) liquid 237 mL  237 mL Oral TID BM McDiarmid, Leighton Roach, MD   237 mL at 05/23/22  1333   gabapentin (NEURONTIN) capsule 300 mg  300 mg Oral TID McDiarmid, Leighton Roach, MD   300 mg at 05/23/22 0824   insulin aspart (novoLOG) injection 0-15 Units  0-15 Units Subcutaneous TID WC Alicia Amel, MD   3 Units at 05/23/22 1212   multivitamin with minerals tablet 1 tablet  1 tablet Oral Daily McDiarmid, Leighton Roach, MD   1 tablet at 05/23/22 0824   [START ON 05/24/2022] polyethylene glycol (MIRALAX / GLYCOLAX) packet 17 g  17 g Oral Daily Carrion-Carrero, Karle Starch, MD       Melene Muller ON 05/24/2022] senna (SENOKOT) tablet 17.2 mg  2 tablet Oral Daily Carrion-Carrero,  Margely, MD       tamsulosin (FLOMAX) capsule 0.4 mg  0.4 mg Oral QPC supper Erick Alley, DO   0.4 mg at 05/22/22 1637   traMADol (ULTRAM) tablet 50 mg  50 mg Oral Q6H PRN Erick Alley, DO   50 mg at 05/23/22 2671   venlafaxine XR (EFFEXOR-XR) 24 hr capsule 150 mg  150 mg Oral Daily Ganta, Anupa, DO   150 mg at 05/23/22 0825     Discharge Medications: Please see discharge summary for a list of discharge medications.  Relevant Imaging Results:  Relevant Lab Results:   Additional Information SS#: [id number]  Kermit Balo, RN

## 2022-05-23 NOTE — Inpatient Diabetes Management (Signed)
Inpatient Diabetes Program Recommendations  AACE/ADA: New Consensus Statement on Inpatient Glycemic Control (2015)  Target Ranges:  Prepandial:   less than 140 mg/dL      Peak postprandial:   less than 180 mg/dL (1-2 hours)      Critically ill patients:  140 - 180 mg/dL   Lab Results  Component Value Date   GLUCAP 178 (H) 05/23/2022   HGBA1C 13.3 (H) 05/19/2022    Review of Glycemic Control  Diabetes history: DM 2 Outpatient Diabetes medications: Jardiance 10 mg Daily, Levemir 33 units bid, Trulicity 0.5 mg weekly Current orders for Inpatient glycemic control:  Novolog 0-15 units tid  Glucerna tid between meals A1c 13.3% on 4/5  Spoke with pt at bedside regarding A1c of 13.3% A1c and glucose control at home. Pt reports trends have been running in the upper 200 range all the time when he checks. He mostly checks his glucose first thing in the morning. Pt reports going to the doctor every week and has had recent weight loss. Pt repots missing doses of his insulin once every couple of days. Discussed glucose and A1c goals. Discussed importance of glucose control at home to reduce common complications of hyperglycemia. Pt reports being more motivated to take care of himself at home since being in the hospital. Pt already thinking about lifestyle changes that need to be made at home. Pt has not further questions at this time.  Thanks,  Christena Deem RN, MSN, BC-ADM Inpatient Diabetes Coordinator Team Pager [phone] (8a-5p)

## 2022-05-23 NOTE — Discharge Summary (Incomplete)
Family Medicine Teaching [facility] Discharge Summary  Patient name: [name] Medical record number: [id number] Date of birth: [birth year] Age: 72 y.o. 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   His hospital course is outlined below:  T2DM (type 2 diabetes mellitus) Patient presenting to ED with CMP significant for hyponatremia, hyperglycemia 249, and elevated anion gap at 20. Likely in the setting of medication noncompliance and poor p.o. intake the past couple weeks. Patient received 1 bolus of NS 500 mL and continued to received maintenance IVF until discharge day of discharge.  Elevated BHB initially at 2.41 and decreasing to 0.138 with normal pH on VBG.  He was placed on SSI and his glucose remained stable with 6 units of Semglee.  His home Jardiance and Trulicity was held due to developing AKI described below.  Stable with plan for close outpatient follow-up for continued management of his diabetes medications.  Neurogenic pain  due to central nervous system abnormality following stroke  Chronic with acute exacerbations of his right hemisensory pain with a history of left thalamic stroke , suspect if central pain syndrome contributed to his pain.  During his hospitalization patient was started on Effexor, titrating to 150 mg daily.  He was also started on gabapentin which was titrated up to 300 mg TID.  Tramadol 50 mg every 6 hours as needed for breakthrough pain was also provided, patient typically requiring 1-2 doses daily.    AKI (acute kidney injury) Likely prerenal in the setting of dehydration, improving with IVF and po intake. Initially Cr 3.09 and improving to 1.78 on day of discharge.    Hyponatremia Presumed precipitant both insulinopenia from non-adherence to his recommended insulin regiment along with decrease by mouth intake due to pain exacerbation.  Improving to 133 on day of discharge.  Concern that his continued poor appetite may exacerbate this when he returns home, plan for close OP follow up.     Abdominal pain Reporting RUQ pain on day of admission, waxing and waning throughout hospitalization.  CT in ED negative for any acute intra-abdominal findings.  Reports some RUQ discomfort on day of discharge, nonconcerning, nontender on palpation.  Denies any nausea, vomiting, or difficulties eating. Abdominal pain worsened in afternoon, reporting 11/10. KUB showing no abnormalities. Pain ultimately improved after dosing Miralax and senna BID with lactulose 1x, patient had BM.     Essential hypertension Hypertensive on admission, bp 158/96.  Home meds include carvedilol 12.5 mg twice daily and lisinopril 20 mg daily per pt's family member although there is no official record of that.  His home lisinopril was held due to his AKI.  Recommend close outpatient follow-up prior to restarting any nephrotoxic agents including lisinopril.  Blood pressures stable on day of discharge, SBP's ranging in the  120s-150s.  Outpatient follow-up recommendations: Repeat BMP at follow up, assess Scr 1.78 at discharge Repeat CBC Reconcile the following medications (held due to AKI and concern for PO intake at discharge) - Jardiance for T2DM - Lisinopril fror HTN     Disposition: SNF  Discharge Condition: Stable    Discharge Exam:  Vitals:   05/24/22 0720 05/24/22 1111  BP: (!) 148/58 (!) 143/66  Pulse: 74 74  Resp: 18 18  Temp: 97.6 F (36.4 C) 97.8 F (36.6 C)  SpO2: 97% 98%   From Dr. Sherrie Mustachearrion-Carrero's same-day progress note:  General: Patient sleeping comfortably in bed, in no acute distress. Respiratory:NAD, breathing comfortably Abdomen: soft, nontender, nondistended, presence of bowel sounds Extremities: no LE edema noted bilaterally Neuro: AOX4, 4/5 right UE strength, 5/5 left UE and LE strength bilaterally    Significant Procedures: None  Significant Labs and Imaging:  No results for input(s): "WBC", "HGB", "HCT", "PLT" in the last 48 hours.  Recent Labs  Lab 05/24/22 0437  NA 133*  K 4.2  CL 104  CO2 23  GLUCOSE 119*  BUN 19  CREATININE 1.80*  CALCIUM 8.2*    DG Abd 1 View  Result Date: 05/22/2022 CLINICAL DATA:  Abdominal pain EXAM: ABDOMEN - 1 VIEW COMPARISON:  05/19/2022 FINDINGS: Scattered large and small bowel gas is noted. No obstructive changes are seen. Previously described right renal stone is not well appreciated on today's exam. No bony abnormality is seen. IMPRESSION: No acute abnormality noted. Electronically Signed   By: Alcide CleverMark  Lukens M.D.   On: 05/22/2022 20:45    Results/Tests Pending at Time of Discharge: None  Discharge Medications:  Allergies as of 05/24/2022   No Known Allergies      Medication List     STOP taking these medications    Jardiance 10 MG Tabs tablet Generic drug: empagliflozin   Levemir FlexTouch 100 UNIT/ML FlexPen Generic drug: insulin detemir   lisinopril 20 MG tablet Commonly known as: ZESTRIL    nortriptyline 25 MG capsule Commonly known as: PAMELOR   nortriptyline 50 MG capsule Commonly known as: PAMELOR   NovoLOG FlexPen 100 UNIT/ML FlexPen Generic drug: insulin aspart   Trulicity 1.5 MG/0.5ML Sopn Generic drug: Dulaglutide Replaced by: Trulicity 0.75 MG/0.5ML Sopn       TAKE these medications    acetaminophen 325 MG tablet Commonly known as: TYLENOL Take 2 tablets (650 mg total) by mouth every 6 (six) hours as needed for moderate pain.   Aspirin Low Dose 81 MG tablet Generic drug: aspirin EC Take 1 tablet (81 mg total) by mouth daily. Swallow whole.   atorvastatin 40 MG tablet Commonly known as: LIPITOR Take 1 tablet (40 mg total) by mouth daily.   BD Pen Needle Nano U/F 32G X 4 MM Misc Generic drug: Insulin Pen Needle Use as directed with insulin pens   carvedilol 12.5 MG tablet Commonly known as: COREG Take 12.5 mg by mouth 2 (two) times daily with a meal.   gabapentin 300 MG capsule Commonly known as: NEURONTIN Take 1 capsule (300 mg total) by mouth 3 (three) times daily. What changed:  medication strength how much to take   insulin glargine 100 UNIT/ML Solostar Pen Commonly known as: LANTUS Inject 6 Units into the skin at bedtime. Expires 28 days  after first use   polyethylene glycol 17 g packet Commonly known as: MIRALAX / GLYCOLAX Take 17 g by mouth 2 (two) times daily.   senna 8.6 MG Tabs tablet Commonly known as: SENOKOT Take 2 tablets (17.2 mg total) by mouth 2 (two) times daily.   tamsulosin 0.4 MG Caps capsule Commonly known as: FLOMAX Take 1 capsule (0.4 mg total) by mouth daily after supper.   traMADol 50 MG tablet Commonly known as: ULTRAM Take 1 tablet (50 mg total) by mouth every 6 (six) hours as needed for severe pain (for breakthrough pain).   Trulicity 0.75 MG/0.5ML Sopn Generic drug: Dulaglutide Inject 0.75 mg into the skin once a week. Replaces: Trulicity 1.5 MG/0.5ML Sopn   venlafaxine XR 150 MG 24 hr  capsule Commonly known as: EFFEXOR-XR Take 1 capsule (150 mg total) by mouth daily.        Discharge Instructions: Please refer to Patient Instructions section of EMR for full details.  Patient was counseled important signs and symptoms that should prompt return to medical care, changes in medications, dietary instructions, activity restrictions, and follow up appointments.   Follow-Up Appointments:  Follow-up Information     [facility] Follow up.   Specialty: Home Health Services Why: The home health agency will contact you for the first home visit. Contact information: [address] [place] [address] [phone]                 Alicia Amel, MD 05/24/2022, 1:14 PM PGY-2, [facility]

## 2022-05-23 NOTE — Progress Notes (Signed)
Occupational Therapy Treatment Patient Details Name: [name] MRN: [id number] DOB: 01-Mar-1950 Today's Date: 05/23/2022   History of present illness [name] is a 72 y/o male who is admitted 05/19/22 under observation due to R sided pain. He has not been compliant with home meds for past 3 weeks and has had poor P/O intake over that time as well. CT and MRI negative for any intracranial process. PMH includes DM2, CVA (residual R-side weakness & paresthesia), HTN, CKD2, MI, obesity, tobacco use.   OT comments  This 72 yo male seen today for ADLs and energy conservation. He was receptive to [place] discussing use of shower seat and RW--just not happy about it. He was able to return demonstrate use of purse lipped breathing for when he feels SOB as well as we discussed other energy conservation strategies from handout that was provided to him. [name] will continue to benefit from continued inpatient follow up therapy, <3 hours/day.    Recommendations for follow up therapy are one component of a multi-disciplinary discharge planning process, led by the attending physician.  Recommendations may be updated based on patient status, additional functional criteria and insurance authorization.    Assistance Recommended at Discharge  frequent  Patient can return home with the following  A little help with walking and/or transfers;A little help with bathing/dressing/bathroom;Assistance with cooking/housework;Direct supervision/assist for medications management;Direct supervision/assist for financial management;Assist for transportation;Help with stairs or ramp for entrance   Equipment Recommendations  Tub/shower seat       Precautions / Restrictions Precautions Precautions: Fall Precaution Comments: watch HR Restrictions Weight Bearing Restrictions: No       Mobility Bed Mobility Overal bed mobility: Modified Independent                  Transfers Overall transfer level:  Needs assistance Equipment used: Rolling walker (2 wheels) Transfers: Sit to/from Stand Sit to Stand: Min guard                 Balance Overall balance assessment: Needs assistance Sitting-balance support: No upper extremity supported, Feet supported Sitting balance-Leahy Scale: Good     Standing balance support: No upper extremity supported, During functional activity Standing balance-Leahy Scale: Fair Standing balance comment: standing at sink to brush teeth                           ADL either performed or assessed with clinical judgement   ADL Overall ADL's : Needs assistance/impaired     Grooming: Min guard;Standing;Oral care               Lower Body Dressing: Min guard;Sit to/from stand Lower Body Dressing Details (indicate cue type and reason): bends foreward while seated EOB to doff and donn socks Toilet Transfer: Minimal assistance;Ambulation;Rolling walker (2 wheels) Toilet Transfer Details (indicate cue type and reason): simulated bed>stand at sink to brush teeth>sit EOB           General ADL Comments: Educated on energy conservation strategies from handout as well as pursed lipped breathing.    Extremity/Trunk Assessment Upper Extremity Assessment Upper Extremity Assessment: Overall WFL for tasks assessed            Vision Baseline Vision/History: 1 Wears glasses Patient Visual Report: No change from baseline            Cognition Arousal/Alertness: Awake/alert   Overall Cognitive Status: Impaired/Different from baseline  Safety/Judgement: Decreased awareness of deficits, Decreased awareness of safety     General Comments: Continue to discuss with patient why he really needs to use a RW and now added shower seat for safety to avoid falls--he says he understands.                   Pertinent Vitals/ Pain       Pain Assessment Pain Assessment: No/denies pain         Frequency   Min 2X/week        Progress Toward Goals  OT Goals(current goals can now be found in the care plan section)  Progress towards OT goals: Progressing toward goals  Acute Rehab OT Goals Patient Stated Goal: to go to rehab then home without having to use RW OT Goal Formulation: With patient Time For Goal Achievement: 06/03/22 Potential to Achieve Goals: Good  Plan Discharge plan needs to be updated       AM-PAC OT "6 Clicks" Daily Activity     Outcome Measure   Help from another person eating meals?: None Help from another person taking care of personal grooming?: A Little Help from another person toileting, which includes using toliet, bedpan, or urinal?: A Little Help from another person bathing (including washing, rinsing, drying)?: A Little Help from another person to put on and taking off regular upper body clothing?: A Little Help from another person to put on and taking off regular lower body clothing?: A Little 6 Click Score: 19    End of Session Equipment Utilized During Treatment: Gait belt;Rolling walker (2 wheels)  OT Visit Diagnosis: Unsteadiness on feet (R26.81);Muscle weakness (generalized) (M62.81)   Activity Tolerance Patient tolerated treatment well   Patient Left in bed;with call bell/phone within reach;with bed alarm set           Time: 2355-7322 OT Time Calculation (min): 25 min  Charges: OT General Charges $OT Visit: 1 Visit OT Treatments $Self Care/Home Management : 23-37 mins  Lindon Romp OT Acute Rehabilitation Services Office [phone]    Evette Georges 05/23/2022, 4:55 PM

## 2022-05-23 NOTE — Progress Notes (Addendum)
Physical Therapy Treatment Patient Details Name: [name] MRN: [id number] DOB: [birth year] Today's Date: 05/23/2022   History of Present Illness [name] is a 72 y/o male who is admitted 05/19/22 under observation due to R sided pain. He has not been compliant with home meds for past 3 weeks and has had poor P/O intake over that time as well. CT and MRI negative for any intracranial process. PMH includes DM2, CVA (residual R-side weakness & paresthesia), HTN, CKD2, MI, obesity, tobacco use.    PT Comments    Patient progressing slowly towards PT goals. Session focused on gait training with use of RW as well as balance. Pt with 3 complete LOB walking in hallway and knee buckling needing Max-total A to prevent fall. Pt with poor awareness of safety/deficits. "It is not cool to use a walker." Finally convinced pt to use RW temporarily for safe mobilization to prevent falls. Balance much improved with use of RW with no overt LOB. Cues needed for RW proximity and upright posture. 2 standing rest breaks needed due to fatigue and SOB. Will need RW as pt is a high fall risk. Admits feeling weak. Due to poor balance and lack of safety awareness, pt would not be safe home alone and would really benefit from post acute rehab. Will continue to follow.    Recommendations for follow up therapy are one component of a multi-disciplinary discharge planning process, led by the attending physician.  Recommendations may be updated based on patient status, additional functional criteria and insurance authorization.  Follow Up Recommendations       Assistance Recommended at Discharge Intermittent Supervision/Assistance  Patient can return home with the following A little help with walking and/or transfers;A little help with bathing/dressing/bathroom;Assistance with cooking/housework;Direct supervision/assist for medications management;Assist for transportation;Help with stairs or ramp for entrance    Equipment Recommendations  Rolling walker (2 wheels)    Recommendations for Other Services       Precautions / Restrictions Precautions Precautions: Fall;Other (comment) Precaution Comments: watch HR Restrictions Weight Bearing Restrictions: No     Mobility  Bed Mobility Overal bed mobility: Modified Independent             General bed mobility comments: Increased time    Transfers Overall transfer level: Needs assistance Equipment used: None Transfers: Sit to/from Stand Sit to Stand: Min guard           General transfer comment: Min guard for safetym, stood from EOB x1.  SLow to rise.    Ambulation/Gait Ambulation/Gait assistance: Max assist, Min assist Gait Distance (Feet): 200 Feet Assistive device: Rolling walker (2 wheels), None Gait Pattern/deviations: Decreased stride length, Decreased step length - left, Step-through pattern, Staggering left, Staggering right, Trunk flexed Gait velocity: decreased Gait velocity interpretation: <1.31 ft/sec, indicative of household ambulator   General Gait Details: Pt with slow unsteady gait with 3 complete LOB needing Max A to recover, also with episode of knees buckling needing external support to prevent fall, grabbed RW and balance much improved, needs cues for upright posture and RW proximity/management. Fatigues. 3/4 DOE noted.   Stairs             Wheelchair Mobility    Modified Rankin (Stroke Patients Only)       Balance Overall balance assessment: Needs assistance Sitting-balance support: Feet supported, No upper extremity supported Sitting balance-Leahy Scale: Good     Standing balance support: During functional activity Standing balance-Leahy Scale: Fair Standing balance comment: Needs UE support for  walking                            Cognition Arousal/Alertness: Awake/alert Behavior During Therapy: WFL for tasks assessed/performed Overall Cognitive Status:  Impaired/Different from baseline Area of Impairment: Safety/judgement                         Safety/Judgement: Decreased awareness of deficits, Decreased awareness of safety     General Comments: Decreased awareness of deficits i.e. episodes of loss of balance, "it is not cool to use a RW."        Exercises      General Comments General comments (skin integrity, edema, etc.): VSS on RA.      Pertinent Vitals/Pain Pain Assessment Pain Assessment: 0-10 Pain Score: 8  Pain Location: R side Pain Descriptors / Indicators: Discomfort, Sore Pain Intervention(s): Monitored during session, Limited activity within patient's tolerance, Repositioned    Home Living                          Prior Function            PT Goals (current goals can now be found in the care plan section) Progress towards PT goals: Progressing toward goals    Frequency    Min 3X/week      PT Plan Current plan remains appropriate    Co-evaluation              AM-PAC PT "6 Clicks" Mobility   Outcome Measure  Help needed turning from your back to your side while in a flat bed without using bedrails?: None Help needed moving from lying on your back to sitting on the side of a flat bed without using bedrails?: None Help needed moving to and from a bed to a chair (including a wheelchair)?: A Little Help needed standing up from a chair using your arms (e.g., wheelchair or bedside chair)?: A Little Help needed to walk in hospital room?: A Lot Help needed climbing 3-5 steps with a railing? : A Lot 6 Click Score: 18    End of Session Equipment Utilized During Treatment: Gait belt Activity Tolerance: Patient tolerated treatment well Patient left: in bed;with call bell/phone within reach;with bed alarm set (sitting EOB eating) Nurse Communication: Mobility status PT Visit Diagnosis: Unsteadiness on feet (R26.81);Difficulty in walking, not elsewhere classified (R26.2)      Time: 3888-2800 PT Time Calculation (min) (ACUTE ONLY): 21 min  Charges:  $Gait Training: 8-22 mins                     Vale Haven, PT, DPT Acute Rehabilitation Services Secure chat preferred Office [phone]      Blake Divine A Lanier Ensign 05/23/2022, 8:35 AM

## 2022-05-23 NOTE — Progress Notes (Signed)
Daily Progress Note Intern Pager: [phone]  Patient name: [name] Medical record number: [id number] Date of birth: 1950-12-15 Age: 72 y.o. Gender: male  Primary Care Provider: Orpah Cobb, MD Consultants: None Code Status: DNR  Pt Overview and Major Events to Date:  4/5: Admitted   Assessment and Plan: 72 yo male presenting with R sided body pain and weakness in the setting of non-adherence to home medications and poor PO intake. Pertinent PMH/PSH includes CVA in 2007, MI in 2007, uncontrolled DM2, CKD, HLD.  * Ketotic Hyperglycemia Overnight CBGs in 160-170s, BHB wnl.  -Continue to hold jardiance, hope to restart as AKI resolves and remainder of labs normalize -Continue mIVF -mSSI, adjust insulin as appropriate -regular diet -encourage oral intake   Abdominal pain Abdominal pain improved with BM s/p miralax + senokot BID w/ lactulose 1x.  - Senokot daily - Miralax daily  Hyponatremia Likely secondary to hypovolemic hyponatremia in the setting of poor po intake, Na+ 131>>133 today (corrected 134). - Continuing IVF - trend BMP   Neurogenic pain due to central nervous system abnormality following stroke Pain remains stable with gabapentin and effexor. He is currently awaiting SNF place, per RN patient almost fell later in the afternoon and was agreeable to SNF placement. CSW on board and faxing him out.  - Effexor 150 mg daily as weight adjustment  - Gabapentin to 300mg  TID - Tramadol 50mg  q6h PRN for breakthrough pain  - Fall precautions - Dispo: likely home with HHPT when medically stable  AKI (acute kidney injury) Likely prerenal in the setting of dehydration. Improving, Scr downtrending 2.09>>1.78 (baseline ~1.5) -Encourage p.o. intake -continue mIVF (NS 0.9% at 125 ml/hr ) -monitor I/Os -avoid nephrotoxic agents -monitor with daily BMP  History of CVA (cerebrovascular accident) 09/18/2005 hosp admit:  patient presented with left-sided headache,  right-sided weakness and numbness in the upper and lower extremities,  slurred speech and right visual loss MRI: moderate-to-large size acute non- hemorrhagic infarct involving medial and posterior left temporal lobe, left occipital lobe and left thalamus Acute left posterior infarction secondary to left PCA cutoff, thought to be embolic though no source found.    Essential hypertension Stable.  -Restart lisinopril as AKI improves -Carvedilol 12.5 mg BID    -holding lisinopril due to AKI -Monitor BP    Other conditions chronic and stable: BPH: Continue flomax History of stroke and MI: continue daily aspirin and atorvastatin     FEN/GI: regular  PPx: lovenox  Dispo:SNF possibly or HHPT pending clinical improvement .   Subjective:  No acute overnight events reported. Patient states that he feels like the effexor may have helped his mood and is open to increasing dose. Denies any concerns at this time.   Objective: Temp:  [97.6 F (36.4 C)-98 F (36.7 C)] 97.7 F (36.5 C) (04/09 1511) Pulse Rate:  [68-83] 83 (04/09 1511) Resp:  [16-20] 18 (04/09 1511) BP: (143-182)/(76-93) 143/93 (04/09 1511) SpO2:  [95 %-100 %] 96 % (04/09 1511) Physical Exam: General: Patient sleeping comfortably in bed, in no acute distress. Respiratory:NAD, breathing comfortably Abdomen: soft, nontender, nondistended, presence of bowel sounds Extremities: no LE edema noted bilaterally Neuro: AOX4, 4/5 right UE strength, 5/5 left UE and LE strength bilaterally   Laboratory: Most recent CBC Lab Results  Component Value Date   WBC 10.1 05/22/2022   HGB 11.5 (L) 05/22/2022   HCT 35.0 (L) 05/22/2022   MCV 89.7 05/22/2022   PLT 315 05/22/2022   Most recent BMP  Latest Ref Rng & Units 05/22/2022    4:57 AM  BMP  Glucose 70 - 99 mg/dL 397   BUN 8 - 23 mg/dL 25   Creatinine 6.73 - 1.24 mg/dL 4.19   Sodium 379 - 024 mmol/L 133   Potassium 3.5 - 5.1 mmol/L 4.6   Chloride 98 - 111 mmol/L 108    CO2 22 - 32 mmol/L 20   Calcium 8.9 - 10.3 mg/dL 8.0      Imaging/Diagnostic Tests: DG Abd 1 View  Result Date: 05/22/2022 CLINICAL DATA:  Abdominal pain EXAM: ABDOMEN - 1 VIEW COMPARISON:  05/19/2022 FINDINGS: Scattered large and small bowel gas is noted. No obstructive changes are seen. Previously described right renal stone is not well appreciated on today's exam. No bony abnormality is seen. IMPRESSION: No acute abnormality noted. Electronically Signed   By: Alcide Clever M.D.   On: 05/22/2022 20:45     Lorri Frederick, MD 05/23/2022, 3:39 PM  PGY-1, [facility] Health Family Medicine FPTS Intern pager: [phone], text pages welcome Secure chat group [facility] Teaching Service

## 2022-05-23 NOTE — Progress Notes (Addendum)
Re: [name] DOB:[birth year] Date:05/23/2022   To Whom It May Concern:  Please be advised that the above-named patient will require a short-term nursing home stay--anticipated 30 days or less for rehabilitation and strengthening. The plan is for home.

## 2022-05-23 NOTE — TOC Progression Note (Signed)
Transition of Care [facility]) - Progression Note    Patient Details  Name: FYNN REVES MRN: [id number] Date of Birth: Oct 17, 1950  Transition of Care [facility]) CM/SW Contact  Kermit Balo, RN Phone Number: 05/23/2022, 3:36 PM  Clinical Narrative:    PT has changed their recommendations to SNF rehab. CM spoke with the patient and he is in agreement. CM has faxed him out in the [facility] area per his request. Will provide bed offers in the am. [facility] pending and additional information will be sent to [facility]. TOC following.   Expected Discharge Plan: Home w Home Health Services Barriers to Discharge: No Barriers Identified  Expected Discharge Plan and Services                                   HH Arranged: PT, RN [facility] Agency: [facility] Date [facility] Agency Contacted: 05/22/22   Representative spoke with at [facility] Agency: Kandee Keen   Social Determinants of Health (SDOH) Interventions SDOH Screenings   Food Insecurity: No Food Insecurity (05/19/2022)  Housing: Low Risk  (05/19/2022)  Transportation Needs: No Transportation Needs (05/19/2022)  Utilities: Not At Risk (05/19/2022)  Tobacco Use: High Risk (05/20/2022)    Readmission Risk Interventions     No data to display

## 2022-05-24 DIAGNOSIS — E1165 Type 2 diabetes mellitus with hyperglycemia: Secondary | ICD-10-CM | POA: Diagnosis not present

## 2022-05-24 DIAGNOSIS — I69398 Other sequelae of cerebral infarction: Secondary | ICD-10-CM | POA: Diagnosis not present

## 2022-05-24 DIAGNOSIS — W19XXXA Unspecified fall, initial encounter: Secondary | ICD-10-CM | POA: Diagnosis not present

## 2022-05-24 DIAGNOSIS — R2689 Other abnormalities of gait and mobility: Secondary | ICD-10-CM | POA: Diagnosis not present

## 2022-05-24 LAB — BASIC METABOLIC PANEL
Anion gap: 6 (ref 5–15)
BUN: 19 mg/dL (ref 8–23)
CO2: 23 mmol/L (ref 22–32)
Calcium: 8.2 mg/dL — ABNORMAL LOW (ref 8.9–10.3)
Chloride: 104 mmol/L (ref 98–111)
Creatinine, Ser: 1.8 mg/dL — ABNORMAL HIGH (ref 0.61–1.24)
GFR, Estimated: 39 mL/min — ABNORMAL LOW (ref >60)
Glucose, Bld: 119 mg/dL — ABNORMAL HIGH (ref 70–99)
Potassium: 4.2 mmol/L (ref 3.5–5.1)
Sodium: 133 mmol/L — ABNORMAL LOW (ref 135–145)

## 2022-05-24 LAB — GLUCOSE, CAPILLARY
Glucose-Capillary: 111 mg/dL — ABNORMAL HIGH (ref 70–99)
Glucose-Capillary: 199 mg/dL — ABNORMAL HIGH (ref 70–99)
Glucose-Capillary: 163 mg/dL — ABNORMAL HIGH (ref 70–99)
Glucose-Capillary: 148 mg/dL — ABNORMAL HIGH (ref 70–99)

## 2022-05-24 MED ORDER — SENNA 8.6 MG PO TABS
2.0000 | ORAL_TABLET | Freq: Two times a day (BID) | ORAL | Status: DC
  Administered 2022-05-24 – 2022-05-25 (×2): 17.2 mg via ORAL
  Filled 2022-05-24 (×2): qty 2

## 2022-05-24 MED ORDER — SENNA 8.6 MG PO TABS: 2.0000 | ORAL_TABLET | Freq: Two times a day (BID) | ORAL | 0 refills | Status: AC

## 2022-05-24 MED ORDER — TRAMADOL HCL 50 MG PO TABS: 50.0000 mg | ORAL_TABLET | Freq: Four times a day (QID) | ORAL | 0 refills | Status: DC | PRN

## 2022-05-24 MED ORDER — INSULIN GLARGINE 100 UNIT/ML SOLOSTAR PEN: 6.0000 [IU] | PEN_INJECTOR | Freq: Every day | SUBCUTANEOUS | 0 refills | Status: DC

## 2022-05-24 MED ORDER — TRULICITY 0.75 MG/0.5ML ~~LOC~~ SOAJ: 0.7500 mg | SUBCUTANEOUS | 0 refills | Status: DC

## 2022-05-24 MED ORDER — POLYETHYLENE GLYCOL 3350 17 G PO PACK
17.0000 g | PACK | Freq: Two times a day (BID) | ORAL | Status: DC
  Administered 2022-05-24 – 2022-05-25 (×2): 17 g via ORAL
  Filled 2022-05-24 (×2): qty 1

## 2022-05-24 MED ORDER — POLYETHYLENE GLYCOL 3350 17 G PO PACK: 17.0000 g | PACK | Freq: Two times a day (BID) | ORAL | 0 refills | Status: AC

## 2022-05-24 MED ORDER — VENLAFAXINE HCL ER 150 MG PO CP24: 150.0000 mg | ORAL_CAPSULE | Freq: Every day | ORAL | 0 refills | Status: DC

## 2022-05-24 NOTE — Care Management Important Message (Signed)
Important Message  Patient Details  Name: [name] MRN: [id number] Date of Birth: 30-Oct-1950   Medicare Important Message Given:  Yes     Dorena Bodo 05/24/2022, 2:44 PM

## 2022-05-24 NOTE — TOC Progression Note (Addendum)
Transition of Care [facility]) - Progression Note    Patient Details  Name: JSHAWN LUNDE MRN: [id number] Date of Birth: 02-20-1950  Transition of Care [facility]) CM/SW Contact  Kermit Balo, RN Phone Number: 05/24/2022, 10:00 AM  Clinical Narrative:    Pt provided bed offers and selected [facility]. CM has confirmed bed with Kitty at [facility] and asked [facility] to begin insurance auth.  TOC following.  1400: Received auth for [facility] and son updated. He asked to have facility changed to [facility]. CM had him talk with patient via speaker phone and pt is in agreement. CM has changed auth through [place] to [employer]. Per Marcelino Duster at [facility] they will have a bed for him tomorrow. MD updated.    Expected Discharge Plan: Skilled Nursing Facility Barriers to Discharge: No Barriers Identified  Expected Discharge Plan and Services                                   HH Arranged: PT, RN [facility] Agency: [facility] Care Date [facility] Agency Contacted: 05/22/22   Representative spoke with at [facility] Agency: Kandee Keen   Social Determinants of Health (SDOH) Interventions SDOH Screenings   Food Insecurity: No Food Insecurity (05/19/2022)  Housing: Low Risk  (05/19/2022)  Transportation Needs: No Transportation Needs (05/19/2022)  Utilities: Not At Risk (05/19/2022)  Tobacco Use: High Risk (05/20/2022)    Readmission Risk Interventions     No data to display

## 2022-05-24 NOTE — Progress Notes (Addendum)
Daily Progress Note Intern Pager: [phone]  Patient name: [name] Medical record number: [id number] Date of birth: Feb 01, 1951 Age: 72 y.o. Gender: male  Primary Care Provider: Orpah Cobb, MD Consultants: None Code Status: DNR  Pt Overview and Major Events to Date:  4/5: Admitted   Assessment and Plan: 72 yo male presenting with R sided body pain and weakness in the setting of non-adherence to home medications and poor PO intake. Pertinent PMH/PSH includes CVA in 2007, MI in 2007, uncontrolled DM2, CKD, HLD.  * Ketotic Hyperglycemia Overnight CBGs <180. -Continue to hold jardiance, hope to restart as AKI resolves and remainder of labs normalize -Continue mIVF -mSSI, adjust insulin as appropriate -regular diet -encourage oral intake   Abdominal pain Abdominal pain improved with BM s/p miralax + senokot BID w/ lactulose 1x.  - Senokot BID - Miralax BID  Hyponatremia Stable. Likely secondary to hypovolemic hyponatremia in the setting of poor po intake, Na+ 133 today. 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 Awaiting SNF place, per RN patient almost fell later in the afternoon and was agreeable to SNF placement. CSW on board and faxing him out.  - Effexor 150 mg daily as weight adjustment  - Gabapentin to 300mg  TID - Tramadol 50mg  q6h PRN for breakthrough pain  - Fall precautions - Dispo: likely home with HHPT when medically stable  AKI (acute kidney injury) Likely prerenal in the setting of dehydration. Improving, Scr downtrending 2.09>>1.78 (baseline ~1.5) -Encourage p.o. intake -continue mIVF (NS 0.9% at 125 ml/hr ) -monitor I/Os -avoid nephrotoxic agents -monitor with daily BMP  History of CVA (cerebrovascular accident) Stable. 09/18/2005 hosp admit:  patient presented with left-sided headache, right-sided weakness and numbness in the upper and lower  extremities,  slurred speech and right visual loss MRI: moderate-to-large size acute non- hemorrhagic infarct involving medial and posterior left temporal lobe, left occipital lobe and left thalamus Acute left posterior infarction secondary to left PCA cutoff, thought to be embolic though no source found.    Essential hypertension Stable. -Carvedilol 12.5 mg BID    -holding lisinopril due to AKI -Monitor BP   Consider adding  lisinopril today, Scr around his baseline.    Other conditions chronic and stable: BPH: Continue flomax History of stroke and MI: continue daily aspirin and atorvastatin     FEN/GI: regular  PPx: lovenox  Dispo:SNF at [facility], waiting for insurance auth  Subjective:  Patient at bedside resting comfortably, no acute complaints.  Does report he is constipated, amenable to increasing his bowel regiment.  Otherwise right-sided pain is improved, smiles and jokes with this Clinical research associate.  Affect appears brighter today.  Objective: Temp:  [97.6 F (36.4 C)-98.5 F (36.9 C)] 97.8 F (36.6 C) (04/10 1111) Pulse Rate:  [73-83] 74 (04/10 1111) Resp:  [16-20] 18 (04/10 1111) BP: (143-184)/(58-96) 143/66 (04/10 1111) SpO2:  [96 %-100 %] 98 % (04/10 1111) Physical Exam: General: Patient sleeping comfortably in bed, in no acute distress. Respiratory:NAD, breathing comfortably Abdomen: soft, nontender, nondistended, presence of bowel sounds Extremities: no LE edema noted bilaterally Neuro: AOX4, 4/5 right UE strength, 5/5 left UE and LE strength bilaterally   Laboratory: Most recent CBC Lab Results  Component Value Date   WBC 10.1 05/22/2022   HGB 11.5 (L) 05/22/2022   HCT 35.0 (L) 05/22/2022   MCV 89.7 05/22/2022   PLT 315 05/22/2022   Most recent BMP  Latest Ref Rng & Units 05/24/2022    4:37 AM  BMP  Glucose 70 - 99 mg/dL 115   BUN 8 - 23 mg/dL 19   Creatinine 7.26 - 1.24 mg/dL 2.03   Sodium 559 - 741 mmol/L 133   Potassium 3.5 - 5.1 mmol/L 4.2    Chloride 98 - 111 mmol/L 104   CO2 22 - 32 mmol/L 23   Calcium 8.9 - 10.3 mg/dL 8.2      Imaging/Diagnostic Tests: No results found.   Lorri Frederick, MD 05/24/2022, 11:28 AM  PGY-1, [facility] Health Family Medicine FPTS Intern pager: [phone], text pages welcome Secure chat group [facility] Teaching Service

## 2022-05-25 ENCOUNTER — Other Ambulatory Visit (HOSPITAL_COMMUNITY): Payer: Self-pay

## 2022-05-25 DIAGNOSIS — E1165 Type 2 diabetes mellitus with hyperglycemia: Secondary | ICD-10-CM | POA: Diagnosis not present

## 2022-05-25 DIAGNOSIS — Z794 Long term (current) use of insulin: Secondary | ICD-10-CM | POA: Diagnosis not present

## 2022-05-25 LAB — GLUCOSE, CAPILLARY
Glucose-Capillary: 113 mg/dL — ABNORMAL HIGH (ref 70–99)
Glucose-Capillary: 207 mg/dL — ABNORMAL HIGH (ref 70–99)

## 2022-05-25 MED ORDER — LACTULOSE 20 GM/30ML PO SOLN
10.0000 g | Freq: Every day | ORAL | 0 refills | Status: DC | PRN
  Filled 2022-05-25: qty 450, 30d supply, fill #0

## 2022-05-25 MED ORDER — LACTULOSE 20 GM/30ML PO SOLN: 10.0000 g | Freq: Every day | ORAL | 0 refills | Status: DC | PRN

## 2022-05-25 NOTE — Progress Notes (Signed)
PT Cancellation Note  Patient Details Name: [name] MRN: [id number] DOB: 02/09/1951   Cancelled Treatment:    Reason Eval/Treat Not Completed: (P) Patient declined, no reason specified. Pt requesting therapist check back later if time permits.   Raymond Gurney, PT, DPT Acute Rehabilitation Services  Office: [phone]    [name] 05/25/2022, 12:40 PM

## 2022-05-25 NOTE — TOC Transition Note (Signed)
Transition of Care [facility]) - CM/SW Discharge Note   Patient Details  Name: [name] MRN: [id number] Date of Birth: 15-May-1950  Transition of Care [facility]) CM/SW Contact:  Kermit Balo, RN Phone Number: 05/25/2022, 10:45 AM   Clinical Narrative:    Pt is discharging to [facility] today. He will transport via [facility]. D/c packet is at the desk and bedside RN updated.   Room: 606P Number for report: [phone]   Final next level of care: Skilled Nursing Facility Barriers to Discharge: No Barriers Identified   Patient Goals and CMS Choice CMS Medicare.gov Compare Post Acute Care list provided to:: Patient Represenative (must comment) Choice offered to / list presented to : Adult Children, Patient  Discharge Placement                Patient chooses bed at: [facility] Patient to be transferred to facility by: [facility] Name of family member notified: Rasheen--son Patient and family notified of of transfer: 05/25/22  Discharge Plan and Services Additional resources added to the After Visit Summary for                            [facility] Arranged: PT, RN [facility] Agency: [facility] Health Care Date [facility] Agency Contacted: 05/22/22   Representative spoke with at [facility] Agency: Kandee Keen  Social Determinants of Health (SDOH) Interventions SDOH Screenings   Food Insecurity: No Food Insecurity (05/19/2022)  Housing: Low Risk  (05/19/2022)  Transportation Needs: No Transportation Needs (05/19/2022)  Utilities: Not At Risk (05/19/2022)  Tobacco Use: High Risk (05/20/2022)     Readmission Risk Interventions     No data to display

## 2022-05-25 NOTE — Discharge Summary (Addendum)
Family Medicine Teaching [facility] Discharge Summary  Patient name: [name] Medical record number: [id number] Date of birth: 10/19/1950 Age: 72 y.o. Gender: male Date of Admission: 05/19/2022  Date of Discharge: 05/25/22  Admitting Physician: Leighton Roach McDiarmid, MD  Primary Care Provider: Orpah Cobb, MD Consultants: None  Indication for Hospitalization: Type 2 diabetes mellitus with hyperosmolarity without coma, with long term current use of insulin  Discharge Diagnoses/Problem List:  Principal Problem for Admission:  Other Problems addressed during stay:  Principal Problem:   Ketotic Hyperglycemia Active Problems:   Essential hypertension   Dehydration   Abnormality of gait due to impairment of balance   Hyperglycemia   History of CVA (cerebrovascular accident)   AKI (acute kidney injury)   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   His hospital course is outlined below:  T2DM (type 2 diabetes mellitus) Patient presenting to ED with CMP significant for hyponatremia, hyperglycemia 249, and elevated anion gap at 20. Likely in the setting of medication noncompliance and poor p.o. intake the past couple weeks. Patient received 1 bolus of NS 500 mL and continued to received maintenance IVF until discharge day of discharge.  Elevated BHB initially at 2.41 and decreasing to 0.138 with normal pH on VBG.  He was placed on SSI and his glucose remained stable with 6 units of Semglee.  His home Jardiance and Trulicity was held due to developing AKI described below.  Stable with plan for close outpatient follow-up for  continued management of his diabetes medications.  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  During his hospitalization patient was started on Effexor, titrating to 150 mg daily.  He was also started on gabapentin which was titrated up to 300 mg TID.  Tramadol 50 mg every 6 hours as needed for breakthrough pain was also provided, patient typically requiring 1-2 doses daily.    AKI (acute kidney injury) Likely prerenal in the setting of dehydration, improving with IVF and po intake. Initially Cr 3.09 and improving to 1.78 on day of discharge.    Hyponatremia Presumed precipitant both insulinopenia from non-adherence to his recommended insulin regiment along with decrease by mouth intake due to pain exacerbation.  Improving to 133 on day of discharge.  Concern that his continued poor appetite may exacerbate this when he returns home, plan for close OP follow up.     Abdominal pain Reporting RUQ pain on day of admission, waxing and waning throughout hospitalization.  CT in ED negative for any acute intra-abdominal findings.  Reports some RUQ discomfort on day of discharge, nonconcerning, nontender on palpation.  Denies any nausea, vomiting, or difficulties eating. Abdominal pain worsened in afternoon, reporting 11/10. KUB showing no abnormalities. Pain ultimately improved after dosing Miralax and senna BID with lactulose 1x, patient had BM.     Essential hypertension Hypertensive on admission, bp 158/96.  Home meds include carvedilol 12.5 mg twice daily and lisinopril  20 mg daily per pt's family member although there is no official record of that.  His home lisinopril was held due to his AKI.   Blood pressures stable/slightly elevated on day of discharge, SBP's ranging in the 120s-150s. Restarted lisinopril  20mg  daily on discharge, will need BMP at follow up.   Outpatient follow-up recommendations: **Recommend repeat BMP in 2-3 days to assess Cr Repeat BMP at follow up, assess Scr 1.8 at discharge Repeat CBC Reconcile the following medications (held due to AKI and concern for PO intake at discharge) - Jardiance for T2DM      Disposition: SNF  Discharge Condition: Stable   Discharge Exam:  Vitals:   05/25/22 0403 05/25/22 0744  BP: (!) 161/104 (!) 163/103  Pulse: 70 84  Resp: 18 18  Temp: 98.1 F (36.7 C) 98 F (36.7 C)  SpO2: 97% 100%  General: Patient sleeping comfortably in bed, in no acute distress. Respiratory:NAD, breathing comfortably Abdomen: soft, nontender, nondistended, presence of bowel sounds Extremities: no LE edema noted bilaterally Neuro: AOX4, 4/5 right UE strength, 5/5 left UE and LE strength bilaterally  Significant Procedures: None  Significant Labs and Imaging:  No results for input(s): "WBC", "HGB", "HCT", "PLT" in the last 48 hours. Recent Labs  Lab 05/24/22 0437  NA 133*  K 4.2  CL 104  CO2 23  GLUCOSE 119*  BUN 19  CREATININE 1.80*  CALCIUM 8.2*    DG Abd 1 View  Result Date: 05/22/2022 CLINICAL DATA:  Abdominal pain EXAM: ABDOMEN - 1 VIEW COMPARISON:  05/19/2022 FINDINGS: Scattered large and small bowel gas is noted. No obstructive changes are seen. Previously described right renal stone is not well appreciated on today's exam. No bony abnormality is seen. IMPRESSION: No acute abnormality noted. Electronically Signed   By: Alcide Clever M.D.   On: 05/22/2022 20:45    Results/Tests Pending at Time of Discharge: None  Discharge Medications:  Allergies as of 05/25/2022   No Known Allergies      Medication List     STOP taking these medications    Jardiance 10 MG Tabs tablet Generic drug: empagliflozin   Levemir FlexTouch 100 UNIT/ML FlexPen Generic drug: insulin detemir   nortriptyline 25 MG capsule Commonly known as:  PAMELOR   nortriptyline 50 MG capsule Commonly known as: PAMELOR   NovoLOG FlexPen 100 UNIT/ML FlexPen Generic drug: insulin aspart   Trulicity 1.5 MG/0.5ML Sopn Generic drug: Dulaglutide Replaced by: Trulicity 0.75 MG/0.5ML Sopn       TAKE these medications    acetaminophen 325 MG tablet Commonly known as: TYLENOL Take 2 tablets (650 mg total) by mouth every 6 (six) hours as needed for moderate pain.   Aspirin Low Dose 81 MG tablet Generic drug: aspirin EC Take 1 tablet (81 mg total) by mouth daily. Swallow whole.   atorvastatin 40 MG tablet Commonly known as: LIPITOR Take 1 tablet (40 mg total) by mouth daily.   BD Pen Needle Nano U/F 32G X 4 MM Misc Generic drug: Insulin Pen Needle Use as directed with insulin pens   carvedilol 12.5 MG tablet Commonly known as: COREG Take 12.5 mg by mouth 2 (two) times daily with a meal.   gabapentin 300 MG capsule Commonly known as: NEURONTIN Take 1 capsule (300 mg total) by mouth 3 (three) times daily. What changed:  medication strength how much to take   insulin glargine 100 UNIT/ML Solostar Pen Commonly known as: LANTUS Inject 6 Units into the skin at bedtime. Expires  28 days after first use   Lactulose 20 GM/30ML Soln Take 15 mLs (10 g total) by mouth daily as needed (For constipation).   lisinopril 20 MG tablet Commonly known as: ZESTRIL Take 20 mg by mouth daily.   polyethylene glycol 17 g packet Commonly known as: MIRALAX / GLYCOLAX Take 17 g by mouth 2 (two) times daily.   senna 8.6 MG Tabs tablet Commonly known as: SENOKOT Take 2 tablets (17.2 mg total) by mouth 2 (two) times daily.   tamsulosin 0.4 MG Caps capsule Commonly known as: FLOMAX Take 1 capsule (0.4 mg total) by mouth daily after supper.   traMADol 50 MG tablet Commonly known as: Ultram Take 1 tablet (50 mg total) by mouth every 6 (six) hours as needed.   traMADol 50 MG tablet Commonly known as: ULTRAM Take 1 tablet (50 mg total) by  mouth every 6 (six) hours as needed for severe pain (for breakthrough pain).   Trulicity 0.75 MG/0.5ML Sopn Generic drug: Dulaglutide Inject 0.75 mg into the skin once a week. Replaces: Trulicity 1.5 MG/0.5ML Sopn   venlafaxine XR 150 MG 24 hr capsule Commonly known as: EFFEXOR-XR Take 1 capsule (150 mg total) by mouth daily.        Discharge Instructions: Please refer to Patient Instructions section of EMR for full details.  Patient was counseled important signs and symptoms that should prompt return to medical care, changes in medications, dietary instructions, activity restrictions, and follow up appointments.   Follow-Up Appointments:  Contact information for follow-up providers     [facility].   Specialty: Home Health Services Why: The home health agency will contact you for the first home visit. Contact information: [address] [place] [address] [phone]              Contact information for after-discharge care     Destination     [facility] Preferred SNF .   Service: Skilled Nursing Contact information: [address] [place] [id number] [phone]                     Lorri Frederickarrion-Carrero, Margely, MD 05/25/2022, 10:28 AM PGY-1, [facility]     I have evaluated this patient along with Dr. Theodis Aguasarrion-Carrero and reviewed the above note, making necessary revisions.  Dorothyann GibbsBailey Rayshard Schirtzinger, MD 05/25/2022, 1:34 PM PGY-2, [facility]

## 2022-05-25 NOTE — Progress Notes (Signed)
Patient discharged to [facility], transported via [place]

## 2022-05-25 NOTE — Plan of Care (Signed)
  Problem: Education: Goal: Individualized Educational Video(s) Outcome: Adequate for Discharge   Problem: Coping: Goal: Ability to adjust to condition or change in health will improve Outcome: Adequate for Discharge   Problem: Fluid Volume: Goal: Ability to maintain a balanced intake and output will improve Outcome: Adequate for Discharge   Problem: Health Behavior/Discharge Planning: Goal: Ability to identify and utilize available resources and services will improve Outcome: Adequate for Discharge Goal: Ability to manage health-related needs will improve Outcome: Adequate for Discharge   Problem: Metabolic: Goal: Ability to maintain appropriate glucose levels will improve Outcome: Adequate for Discharge   Problem: Nutritional: Goal: Maintenance of adequate nutrition will improve Outcome: Adequate for Discharge Goal: Progress toward achieving an optimal weight will improve Outcome: Adequate for Discharge   Problem: Skin Integrity: Goal: Risk for impaired skin integrity will decrease Outcome: Adequate for Discharge   Problem: Tissue Perfusion: Goal: Adequacy of tissue perfusion will improve Outcome: Adequate for Discharge   Problem: Education: Goal: Knowledge of General Education information will improve Description: Including pain rating scale, medication(s)/side effects and non-pharmacologic comfort measures Outcome: Adequate for Discharge   Problem: Health Behavior/Discharge Planning: Goal: Ability to manage health-related needs will improve Outcome: Adequate for Discharge   Problem: Clinical Measurements: Goal: Ability to maintain clinical measurements within normal limits will improve Outcome: Adequate for Discharge Goal: Will remain free from infection Outcome: Adequate for Discharge Goal: Diagnostic test results will improve Outcome: Adequate for Discharge Goal: Respiratory complications will improve Outcome: Adequate for Discharge Goal: Cardiovascular  complication will be avoided Outcome: Adequate for Discharge   Problem: Activity: Goal: Risk for activity intolerance will decrease Outcome: Adequate for Discharge   Problem: Nutrition: Goal: Adequate nutrition will be maintained Outcome: Adequate for Discharge   Problem: Coping: Goal: Level of anxiety will decrease Outcome: Adequate for Discharge   Problem: Elimination: Goal: Will not experience complications related to bowel motility Outcome: Adequate for Discharge Goal: Will not experience complications related to urinary retention Outcome: Adequate for Discharge   Problem: Pain Managment: Goal: General experience of comfort will improve Outcome: Adequate for Discharge   Problem: Safety: Goal: Ability to remain free from injury will improve Outcome: Adequate for Discharge   Problem: Skin Integrity: Goal: Risk for impaired skin integrity will decrease Outcome: Adequate for Discharge

## 2022-05-26 ENCOUNTER — Inpatient Hospital Stay: Payer: Self-pay | Admitting: Family Medicine

## 2022-05-29 ENCOUNTER — Other Ambulatory Visit (HOSPITAL_COMMUNITY): Payer: Self-pay

## 2022-06-06 ENCOUNTER — Ambulatory Visit: Payer: Self-pay | Admitting: Student

## 2022-06-15 ENCOUNTER — Other Ambulatory Visit (HOSPITAL_COMMUNITY): Payer: Self-pay

## 2023-03-28 ENCOUNTER — Encounter: Payer: Self-pay | Admitting: Physical Medicine & Rehabilitation

## 2023-05-16 ENCOUNTER — Encounter: Payer: Medicare HMO | Attending: Physical Medicine & Rehabilitation | Admitting: Physical Medicine & Rehabilitation

## 2023-05-16 ENCOUNTER — Encounter: Payer: Self-pay | Admitting: Physical Medicine & Rehabilitation

## 2023-05-16 VITALS — BP 136/87 | HR 90 | Ht 74.0 in | Wt 235.2 lb

## 2023-05-16 DIAGNOSIS — I69398 Other sequelae of cerebral infarction: Secondary | ICD-10-CM | POA: Diagnosis present

## 2023-05-16 DIAGNOSIS — I69351 Hemiplegia and hemiparesis following cerebral infarction affecting right dominant side: Secondary | ICD-10-CM | POA: Insufficient documentation

## 2023-05-16 DIAGNOSIS — M792 Neuralgia and neuritis, unspecified: Secondary | ICD-10-CM | POA: Diagnosis present

## 2023-05-16 MED ORDER — TRAMADOL HCL 50 MG PO TABS
50.0000 mg | ORAL_TABLET | Freq: Two times a day (BID) | ORAL | 2 refills | Status: DC | PRN
Start: 2023-05-16 — End: 2024-05-15

## 2023-05-16 MED ORDER — GABAPENTIN 400 MG PO CAPS
400.0000 mg | ORAL_CAPSULE | Freq: Three times a day (TID) | ORAL | 3 refills | Status: DC
Start: 2023-05-16 — End: 2023-05-17

## 2023-05-16 NOTE — Progress Notes (Deleted)
 Subjective:    Patient ID: [name], male    DOB: 07-08-1950, 73 y.o.   MRN: [id number]  HPI  Pain Inventory Average Pain 8 Pain Right Now 8 My pain is constant and aching  In the last 24 hours, has pain interfered with the following? General activity 3 Relation with others 0 Enjoyment of life 2 What TIME of day is your pain at its worst? night Sleep (in general) Fair  Pain is worse with: {ACTIVITIES:21022942} Pain improves with: {PAIN IMPROVES WUJW:11914782} Relief from Meds: {NUMBERS; 0-10:5044}  {MOBILITY NFA:21308657}  {FUNCTION:21022946}  {NEURO/PSYCH:21022948}  {CPRM PRIOR STUDIES:21022953}  {CPRM PHYSICIANS INVOLVED IN YOUR CARE:21022954}    Family History  Problem Relation Age of Onset   Diabetes Sister    Asthma Other    Cancer Other    Hypertension Other    Stroke Other    Heart disease Other    Obesity Other    Social History   Socioeconomic History   Marital status: Divorced    Spouse name: Not on file   Number of children: Not on file   Years of education: Not on file   Highest education level: Not on file  Occupational History   Not on file  Tobacco Use   Smoking status: Some Days    Types: Cigars   Smokeless tobacco: Never  Substance and Sexual Activity   Alcohol use: Yes    Alcohol/week: 3.0 standard drinks of alcohol    Types: 3 Glasses of wine per week   Drug use: No   Sexual activity: Not Currently  Other Topics Concern   Not on file  Social History Narrative   Not on file   Social Drivers of Health   Financial Resource Strain: Not on file  Food Insecurity: No Food Insecurity (05/19/2022)   Hunger Vital Sign    Worried About Running Out of Food in the Last Year: Never true    Ran Out of Food in the Last Year: Never true  Transportation Needs: No Transportation Needs (05/19/2022)   PRAPARE - Administrator, Civil Service (Medical): No    Lack of Transportation (Non-Medical): No  Physical Activity: Not  on file  Stress: Not on file  Social Connections: Not on file   Past Surgical History:  Procedure Laterality Date   KNEE SURGERY  02/2003   Past Medical History:  Diagnosis Date   Acute lower UTI 08/28/2020   Asthma    Diabetes mellitus without complication (HCC)    Diabetic ketoacidosis, type II (HCC) 02/10/2013   Gout    Heartburn    History of CVA (cerebrovascular accident) 09/18/2005   09/18/2005 hosp admit:  patient presented with left-sided headache, right-sided weakness and numbness in the upper and lower extremities,  slurred speech and right visual loss  MRI: moderate-to-large size acute non- hemorrhagic infarct involving medial and posterior left temporal lobe,  left occipital lobe and left thalamus  Acute left posterior infarction secondary to left PCA cutoff, thought to be   Hyperlipidemia    Hypernatremia 08/28/2020   Hypertension    Obesity    Pleural scarring 08/28/2020   Right homonymous hemianopsia 09/18/2005   Stroke   Stroke (HCC)    Weakness 12/31/2018   There were no vitals taken for this visit.  Opioid Risk Score:   Fall Risk Score:  `1  Depression screen PHQ 2/9      No data to display  Review of Systems     Objective:   Physical Exam        Assessment & Plan:

## 2023-05-16 NOTE — Progress Notes (Signed)
 Subjective:    Patient ID: [name], male    DOB: September 06, 1950, 73 y.o.   MRN: [id number]  HPI This is an initial office visit for [name] who is a pleasant 73 year old male with a history of diabetes and hypertension as well as a prior left thalamic stroke in 2007.  He suffered an apparent stroke in 2024 and developed increased nerve pain on his right side. A that point he was started on Effexor as well as gabapentin and tramadol. He is currently on gabapentin and cymbalta only. He doesn't remember why he stopped tramadol. He doesn't use any topicals. He rarely takes tylenol. He has used heat on occasion.   Symptoms extend from his face to his foot. Night is worst. Weather change makes it worse. Cold weather can make it worse also.   He walks without a device but does fatigue more easily and has to be careful going up the stairs and on unlevel ground. He has to use caution at times.   His mood is in a good place. He has significant but he lives alone.  Pain Inventory Average Pain 8 Pain Right Now 8 My pain is aching  LOCATION OF PAIN  head to toes on right side  BOWEL Number of stools per week: 7  BLADDER Normal  Mobility use a cane ability to climb steps?  yes do you drive?  yes  Function disabled: date disabled 09/2005  Neuro/Psych weakness numbness tingling trouble walking spasms  Prior Studies Any changes since last visit?  no  Physicians involved in your care Any changes since last visit?  no   Family History  Problem Relation Age of Onset   Diabetes Sister    Asthma Other    Cancer Other    Hypertension Other    Stroke Other    Heart disease Other    Obesity Other    Social History   Socioeconomic History   Marital status: Divorced    Spouse name: Not on file   Number of children: Not on file   Years of education: Not on file   Highest education level: Not on file  Occupational History   Not on file  Tobacco Use   Smoking  status: Some Days    Types: Cigars   Smokeless tobacco: Never  Substance and Sexual Activity   Alcohol use: Yes    Alcohol/week: 3.0 standard drinks of alcohol    Types: 3 Glasses of wine per week   Drug use: No   Sexual activity: Not Currently  Other Topics Concern   Not on file  Social History Narrative   Not on file   Social Drivers of Health   Financial Resource Strain: Not on file  Food Insecurity: No Food Insecurity (05/19/2022)   Hunger Vital Sign    Worried About Running Out of Food in the Last Year: Never true    Ran Out of Food in the Last Year: Never true  Transportation Needs: No Transportation Needs (05/19/2022)   PRAPARE - Administrator, Civil Service (Medical): No    Lack of Transportation (Non-Medical): No  Physical Activity: Not on file  Stress: Not on file  Social Connections: Not on file   Past Surgical History:  Procedure Laterality Date   KNEE SURGERY  02/2003   Past Medical History:  Diagnosis Date   Acute lower UTI 08/28/2020   Asthma    Diabetes mellitus without complication (HCC)    Diabetic  ketoacidosis, type II (HCC) 02/10/2013   Gout    Heartburn    History of CVA (cerebrovascular accident) 09/18/2005   09/18/2005 hosp admit:  patient presented with left-sided headache, right-sided weakness and numbness in the upper and lower extremities,  slurred speech and right visual loss  MRI: moderate-to-large size acute non- hemorrhagic infarct involving medial and posterior left temporal lobe,  left occipital lobe and left thalamus  Acute left posterior infarction secondary to left PCA cutoff, thought to be   Hyperlipidemia    Hypernatremia 08/28/2020   Hypertension    Obesity    Pleural scarring 08/28/2020   Right homonymous hemianopsia 09/18/2005   Stroke   Stroke (HCC)    Weakness 12/31/2018   BP 136/87   Pulse 90   Ht 6\' 2"  (1.88 m)   Wt 235 lb 3.2 oz (106.7 kg)   SpO2 97%   BMI 30.20 kg/m   Opioid Risk Score:   Fall Risk  Score:  `1  Depression screen [facility] 2/9     05/16/2023   10:11 AM  Depression screen PHQ 2/9  Decreased Interest 0  Down, Depressed, Hopeless 0  PHQ - 2 Score 0  Altered sleeping 0  Tired, decreased energy 0  Change in appetite 0  Feeling bad or failure about yourself  0  Trouble concentrating 0  Moving slowly or fidgety/restless 0  Suicidal thoughts 0  PHQ-9 Score 0     Review of Systems  Musculoskeletal:        Right sided weakness and pain  All other systems reviewed and are negative.      Objective:   Physical Exam  General: Alert and oriented x 3, No apparent distress HEENT: Head is normocephalic, atraumatic, PERRLA, EOMI, sclera anicteric, oral mucosa pink and moist, dentition intact, ext ear canals clear,  Neck: Supple without JVD or lymphadenopathy Heart: Reg rate and rhythm. No murmurs rubs or gallops Chest: CTA bilaterally without wheezes, rales, or rhonchi; no distress Abdomen: Soft, non-tender, non-distended, bowel sounds positive. Extremities: No clubbing, cyanosis, or edema. Pulses are 2+ Psych: Pt's affect is appropriate. Pt is cooperative Skin: Clean and intact without signs of breakdown Neuro:  Alert and oriented x 3. Normal insight and awareness. Intact Memory. Normal language and speech. Cranial nerve exam unremarkable. MMT: RUE 4+/5. LUE 5/5. Marland Kitchen No PD. RLE 4 to 4+/5. LLE 5/5. Sensory 1/2 on right with partially preserved pain and proprioceptive sense. Favors right side during gait due to impaired position sense. Doesn't use cane Musculoskeletal: Full ROM, No pain with AROM or PROM in the neck, trunk, or extremities. Posture appropriate         Assessment & Plan:  Hx of remote left thalamic infarct with right-sided dysesthetic nerve pain.    Plan: Increase gabapentin to 400mg  tid. Potentially increase to 2400mg  daily side effect permitting Resume tramadol 50mg  q12 prn, #60  -CSA signed  -UDS at next visit 3. Consider lyrica trial 4. Continue same  cymbalt  Thirty minutes of face to face patient care time were spent during this visit. All questions were encouraged and answered. Follow up with me in 2 mos.

## 2023-05-16 NOTE — Patient Instructions (Addendum)
 ALWAYS FEEL FREE TO CALL OUR OFFICE WITH ANY PROBLEMS OR QUESTIONS [phone])  **PLEASE NOTE** ALL MEDICATION REFILL REQUESTS (INCLUDING CONTROLLED SUBSTANCES) NEED TO BE MADE AT LEAST 7 DAYS PRIOR TO REFILL BEING DUE. ANY REFILL REQUESTS INSIDE THAT TIME FRAME MAY RESULT IN DELAYS IN RECEIVING YOUR PRESCRIPTION.    INCREASE GABAPENTIN TO 400MG  CAPSULES THREE X DAILY  USE TRAMADOL 50MG  EVERY 12 HOURS AS NEEDED.

## 2023-05-17 ENCOUNTER — Telehealth: Payer: Self-pay

## 2023-05-17 MED ORDER — GABAPENTIN 400 MG PO CAPS
400.0000 mg | ORAL_CAPSULE | Freq: Three times a day (TID) | ORAL | 3 refills | Status: AC
Start: 2023-05-17 — End: ?

## 2023-05-17 MED ORDER — TRAMADOL HCL 50 MG PO TABS
50.0000 mg | ORAL_TABLET | Freq: Two times a day (BID) | ORAL | 2 refills | Status: AC | PRN
Start: 2023-05-17 — End: 2024-05-16

## 2023-05-17 NOTE — Telephone Encounter (Signed)
 Gabapentin and Tramadol went to [facility] but it needs to go to [facility] on [address].

## 2023-05-17 NOTE — Addendum Note (Signed)
 Addended by: Faith Rogue T on: 05/17/2023 11:03 AM   Modules accepted: Orders

## 2023-07-11 ENCOUNTER — Encounter: Attending: Physical Medicine & Rehabilitation | Admitting: Physical Medicine & Rehabilitation

## 2023-07-11 DIAGNOSIS — M792 Neuralgia and neuritis, unspecified: Secondary | ICD-10-CM | POA: Insufficient documentation

## 2023-07-11 DIAGNOSIS — I69351 Hemiplegia and hemiparesis following cerebral infarction affecting right dominant side: Secondary | ICD-10-CM | POA: Insufficient documentation

## 2023-07-11 DIAGNOSIS — I69398 Other sequelae of cerebral infarction: Secondary | ICD-10-CM | POA: Insufficient documentation

## 2023-11-01 ENCOUNTER — Other Ambulatory Visit: Payer: Self-pay

## 2023-11-01 ENCOUNTER — Emergency Department (HOSPITAL_COMMUNITY)
Admission: EM | Admit: 2023-11-01 | Discharge: 2023-11-02 | Disposition: A | Discharge status: Home / Self Care | Attending: Emergency Medicine | Admitting: Emergency Medicine

## 2023-11-01 DIAGNOSIS — R079 Chest pain, unspecified: Secondary | ICD-10-CM | POA: Diagnosis present

## 2023-11-01 DIAGNOSIS — M25551 Pain in right hip: Secondary | ICD-10-CM | POA: Diagnosis not present
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
## 2023-11-02 ENCOUNTER — Emergency Department (HOSPITAL_COMMUNITY)

## 2023-11-02 ENCOUNTER — Encounter (HOSPITAL_COMMUNITY): Payer: Self-pay

## 2023-11-02 MED ORDER — OXYCODONE HCL 5 MG PO TABS: 5.0000 mg | ORAL_TABLET | Freq: Four times a day (QID) | ORAL | 0 refills | Status: AC | PRN

## 2023-11-02 MED ORDER — ACETAMINOPHEN 500 MG PO TABS
1000.0000 mg | ORAL_TABLET | Freq: Once | ORAL | Status: AC
  Administered 2023-11-02: 1000 mg via ORAL
  Filled 2023-11-02: qty 2

## 2023-11-02 MED ORDER — OXYCODONE HCL 5 MG PO TABS
5.0000 mg | ORAL_TABLET | Freq: Once | ORAL | Status: AC
  Administered 2023-11-02: 5 mg via ORAL
  Filled 2023-11-02: qty 1
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
